# Patient Record
Sex: Female | Born: 1960
Health system: Southern US, Community
[De-identification: ages and names within clinical notes are randomized; demographics above are authoritative.]

## PROBLEM LIST (undated history)

## (undated) DIAGNOSIS — IMO0002 Reserved for concepts with insufficient information to code with codable children: Secondary | ICD-10-CM

## (undated) DIAGNOSIS — D649 Anemia, unspecified: Secondary | ICD-10-CM

## (undated) DIAGNOSIS — Z8249 Family history of ischemic heart disease and other diseases of the circulatory system: Secondary | ICD-10-CM

## (undated) DIAGNOSIS — E785 Hyperlipidemia, unspecified: Secondary | ICD-10-CM

## (undated) DIAGNOSIS — F329 Major depressive disorder, single episode, unspecified: Secondary | ICD-10-CM

## (undated) DIAGNOSIS — M5416 Radiculopathy, lumbar region: Secondary | ICD-10-CM

## (undated) DIAGNOSIS — M1990 Unspecified osteoarthritis, unspecified site: Secondary | ICD-10-CM

## (undated) DIAGNOSIS — M722 Plantar fascial fibromatosis: Secondary | ICD-10-CM

## (undated) DIAGNOSIS — A029 Salmonella infection, unspecified: Secondary | ICD-10-CM

## (undated) DIAGNOSIS — I493 Ventricular premature depolarization: Secondary | ICD-10-CM

## (undated) DIAGNOSIS — F419 Anxiety disorder, unspecified: Secondary | ICD-10-CM

## (undated) DIAGNOSIS — I10 Essential (primary) hypertension: Secondary | ICD-10-CM

## (undated) DIAGNOSIS — K589 Irritable bowel syndrome without diarrhea: Secondary | ICD-10-CM

## (undated) DIAGNOSIS — T7840XA Allergy, unspecified, initial encounter: Secondary | ICD-10-CM

## (undated) DIAGNOSIS — F32A Depression, unspecified: Secondary | ICD-10-CM

## (undated) DIAGNOSIS — M797 Fibromyalgia: Secondary | ICD-10-CM

## (undated) HISTORY — PX: KNEE ARTHROSCOPY: SUR90

## (undated) HISTORY — DX: Hyperlipidemia, unspecified: E78.5

## (undated) HISTORY — PX: EYE SURGERY: SHX253

## (undated) HISTORY — DX: Irritable bowel syndrome, unspecified: K58.9

## (undated) HISTORY — DX: Allergy, unspecified, initial encounter: T78.40XA

## (undated) HISTORY — DX: Anemia, unspecified: D64.9

## (undated) HISTORY — DX: Reserved for concepts with insufficient information to code with codable children: IMO0002

## (undated) HISTORY — DX: Plantar fascial fibromatosis: M72.2

## (undated) HISTORY — PX: POLYPECTOMY: SHX149

## (undated) HISTORY — DX: Family history of ischemic heart disease and other diseases of the circulatory system: Z82.49

## (undated) HISTORY — PX: COLONOSCOPY: SHX174

## (undated) HISTORY — DX: Salmonella infection, unspecified: A02.9

## (undated) HISTORY — PX: TONSILLECTOMY: SUR1361

## (undated) HISTORY — DX: Unspecified osteoarthritis, unspecified site: M19.90

## (undated) HISTORY — PX: OTHER SURGICAL HISTORY: SHX169

## (undated) HISTORY — DX: Radiculopathy, lumbar region: M54.16

## (undated) HISTORY — DX: Fibromyalgia: M79.7

## (undated) HISTORY — DX: Ventricular premature depolarization: I49.3

## (undated) HISTORY — DX: Essential (primary) hypertension: I10

## (undated) HISTORY — DX: Anxiety disorder, unspecified: F41.9

---

## 1998-12-29 ENCOUNTER — Encounter: Admission: RE | Admit: 1998-12-29 | Discharge: 1999-03-29 | Payer: Self-pay | Admitting: Internal Medicine

## 1999-12-05 ENCOUNTER — Other Ambulatory Visit: Admission: RE | Admit: 1999-12-05 | Discharge: 1999-12-05 | Payer: Self-pay | Admitting: Internal Medicine

## 1999-12-28 ENCOUNTER — Encounter: Payer: Self-pay | Admitting: Internal Medicine

## 1999-12-28 ENCOUNTER — Encounter: Admission: RE | Admit: 1999-12-28 | Discharge: 1999-12-28 | Payer: Self-pay | Admitting: Internal Medicine

## 2000-01-08 ENCOUNTER — Encounter: Admission: RE | Admit: 2000-01-08 | Discharge: 2000-01-08 | Payer: Self-pay | Admitting: Internal Medicine

## 2000-01-08 ENCOUNTER — Encounter: Payer: Self-pay | Admitting: Internal Medicine

## 2000-01-31 ENCOUNTER — Encounter: Admission: RE | Admit: 2000-01-31 | Discharge: 2000-01-31 | Payer: Self-pay | Admitting: Family Medicine

## 2000-01-31 ENCOUNTER — Encounter: Payer: Self-pay | Admitting: Gynecology

## 2000-02-26 ENCOUNTER — Ambulatory Visit (HOSPITAL_COMMUNITY): Admission: RE | Admit: 2000-02-26 | Discharge: 2000-02-26 | Payer: Self-pay | Admitting: Gynecology

## 2001-09-30 ENCOUNTER — Ambulatory Visit (HOSPITAL_COMMUNITY): Admission: AD | Admit: 2001-09-30 | Discharge: 2001-09-30 | Payer: Self-pay | Admitting: *Deleted

## 2001-09-30 ENCOUNTER — Encounter (INDEPENDENT_AMBULATORY_CARE_PROVIDER_SITE_OTHER): Payer: Self-pay | Admitting: Specialist

## 2001-10-02 ENCOUNTER — Inpatient Hospital Stay (HOSPITAL_COMMUNITY): Admission: AD | Admit: 2001-10-02 | Discharge: 2001-10-02 | Payer: Self-pay | Admitting: *Deleted

## 2001-10-11 ENCOUNTER — Inpatient Hospital Stay (HOSPITAL_COMMUNITY): Admission: AD | Admit: 2001-10-11 | Discharge: 2001-10-11 | Payer: Self-pay | Admitting: Gynecology

## 2002-11-29 ENCOUNTER — Encounter: Payer: Self-pay | Admitting: Internal Medicine

## 2002-11-29 ENCOUNTER — Encounter: Admission: RE | Admit: 2002-11-29 | Discharge: 2002-11-29 | Payer: Self-pay | Admitting: Internal Medicine

## 2004-01-30 ENCOUNTER — Inpatient Hospital Stay (HOSPITAL_COMMUNITY): Admission: AD | Admit: 2004-01-30 | Discharge: 2004-01-30 | Payer: Self-pay | Admitting: Gynecology

## 2004-02-20 ENCOUNTER — Encounter: Admission: RE | Admit: 2004-02-20 | Discharge: 2004-02-20 | Payer: Self-pay | Admitting: Internal Medicine

## 2004-09-21 ENCOUNTER — Emergency Department (HOSPITAL_COMMUNITY): Admission: EM | Admit: 2004-09-21 | Discharge: 2004-09-21 | Payer: Self-pay | Admitting: Emergency Medicine

## 2004-10-11 ENCOUNTER — Other Ambulatory Visit: Admission: RE | Admit: 2004-10-11 | Discharge: 2004-10-11 | Payer: Self-pay | Admitting: Internal Medicine

## 2005-03-19 ENCOUNTER — Encounter: Admission: RE | Admit: 2005-03-19 | Discharge: 2005-03-19 | Payer: Self-pay | Admitting: Internal Medicine

## 2005-10-03 ENCOUNTER — Other Ambulatory Visit: Admission: RE | Admit: 2005-10-03 | Discharge: 2005-10-03 | Payer: Self-pay | Admitting: Internal Medicine

## 2006-03-21 ENCOUNTER — Encounter: Admission: RE | Admit: 2006-03-21 | Discharge: 2006-03-21 | Payer: Self-pay | Admitting: Internal Medicine

## 2006-10-23 ENCOUNTER — Other Ambulatory Visit: Admission: RE | Admit: 2006-10-23 | Discharge: 2006-10-23 | Payer: Self-pay | Admitting: Internal Medicine

## 2007-06-10 ENCOUNTER — Encounter: Admission: RE | Admit: 2007-06-10 | Discharge: 2007-06-10 | Payer: Self-pay | Admitting: Family Medicine

## 2007-06-23 ENCOUNTER — Encounter: Admission: RE | Admit: 2007-06-23 | Discharge: 2007-06-23 | Payer: Self-pay | Admitting: Family Medicine

## 2008-02-23 ENCOUNTER — Other Ambulatory Visit: Admission: RE | Admit: 2008-02-23 | Discharge: 2008-02-23 | Payer: Self-pay | Admitting: Family Medicine

## 2008-04-01 ENCOUNTER — Encounter: Admission: RE | Admit: 2008-04-01 | Discharge: 2008-04-01 | Payer: Self-pay | Admitting: Family Medicine

## 2008-07-14 ENCOUNTER — Ambulatory Visit: Payer: Self-pay | Admitting: Obstetrics and Gynecology

## 2008-12-19 ENCOUNTER — Ambulatory Visit: Payer: Self-pay | Admitting: Obstetrics and Gynecology

## 2009-04-07 ENCOUNTER — Encounter: Admission: RE | Admit: 2009-04-07 | Discharge: 2009-04-07 | Payer: Self-pay | Admitting: Obstetrics and Gynecology

## 2009-05-25 ENCOUNTER — Other Ambulatory Visit: Admission: RE | Admit: 2009-05-25 | Discharge: 2009-05-25 | Payer: Self-pay | Admitting: Obstetrics and Gynecology

## 2009-05-25 ENCOUNTER — Ambulatory Visit: Payer: Self-pay | Admitting: Obstetrics and Gynecology

## 2009-05-25 ENCOUNTER — Encounter: Payer: Self-pay | Admitting: Obstetrics and Gynecology

## 2009-12-27 ENCOUNTER — Ambulatory Visit: Payer: Self-pay | Admitting: Obstetrics and Gynecology

## 2010-01-20 ENCOUNTER — Emergency Department (HOSPITAL_BASED_OUTPATIENT_CLINIC_OR_DEPARTMENT_OTHER): Admission: EM | Admit: 2010-01-20 | Discharge: 2010-01-20 | Payer: Self-pay | Admitting: Emergency Medicine

## 2010-01-24 ENCOUNTER — Ambulatory Visit: Payer: Self-pay | Admitting: Obstetrics and Gynecology

## 2010-02-01 ENCOUNTER — Ambulatory Visit: Payer: Self-pay | Admitting: Obstetrics and Gynecology

## 2010-05-23 ENCOUNTER — Ambulatory Visit: Payer: Self-pay | Admitting: Obstetrics and Gynecology

## 2010-07-30 ENCOUNTER — Encounter: Admission: RE | Admit: 2010-07-30 | Discharge: 2010-07-30 | Payer: Self-pay | Admitting: Obstetrics and Gynecology

## 2010-08-07 ENCOUNTER — Other Ambulatory Visit: Admission: RE | Admit: 2010-08-07 | Discharge: 2010-08-07 | Payer: Self-pay | Admitting: Obstetrics and Gynecology

## 2010-08-07 ENCOUNTER — Ambulatory Visit: Payer: Self-pay | Admitting: Obstetrics and Gynecology

## 2010-11-18 ENCOUNTER — Encounter: Payer: Self-pay | Admitting: Family Medicine

## 2011-03-15 NOTE — Op Note (Signed)
[facility]  Patient:    Megan Combs, Megan Combs Visit Number: [id number] MRN: [id number]          Service Type: MED Location: [facility] Attending Physician:  Collene Schlichter Dictated by:   Almedia Balls. Randell Patient, M.D. Proc. Date: 09/30/01 Admit Date:  09/30/2001   CC:         Leatha Gilding. Mezer, M.D.   Operative Report  PREOPERATIVE DIAGNOSIS:       Probable missed abortion.  POSTOPERATIVE DIAGNOSIS:      Probable missed abortion pending pathology.  OPERATION:                    Suction dilation and evacuation.  SURGEON:                      Almedia Balls. Randell Patient, M.D.  ANESTHESIA:                   MAC with local 1% lidocaine, 10 ml paracervical block.  INDICATIONS:                  The patient is a 50 year old who has been followed by Dr. Chevis Pretty for infertility.  Over the past 1-1/2 weeks, she has had a quantitative hCG level which initially rose slowly and then dropped.  Dr. Chevis Pretty and the patient discussed this problem, and felt that she should undergo a D&E to ensure that there was trophoblastic tissue within the uterus and to provide for further infertility workup.  She has been fully counseled as to the nature of the procedure and the risks involved to include the risk of anesthesia; injury to the uterus, tubes, ovaries, bowel, bladder, blood vessels or ureters; postoperative hemorrhage; infection; and recuperation. She fully understands all of these considerations and wishes to proceed on September 30, 2001.  OPERATIVE FINDINGS:           On bimanual exam, the uterus was top normal size.  There were no palpable adnexal masses.  On speculum exam, the cervix was slightly dilated, with some blood passing through the os.  The uterus sounded to approximately 8 cm.  DESCRIPTION OF PROCEDURE:     With the patient under sedation, she was prepared and draped in the usual sterile fashion in the dorsal lithotomy position.  A speculum was placed in the vagina.   Lidocaine solution, 1%, was injected at the 3, 9 and 12 oclock positions for a total of 10 ml for paracervical block.  A single-tooth tenaculum was placed on the anterior lip of the cervix and the cervix was dilated to a #27 Pratt dilator.  A #9 curved suction curet was employed to remove uterine contents.  After no further tissue was obtained, a small sharp curet was used for gentle exploration of the endometrial cavity.  The suction curet was again employed to ensure that all tissue had been removed.  After noting that all tissue was removed and that hemostasis was maintained, the procedure was terminated.  ESTIMATED BLOOD LOSS:         Approximately 100 ml from the procedure.  DISPOSITION:                  The patient was taken to the recovery room in good condition following catheterization and free flow of clear urine.  The patient will be discharged with stable vital signs from the PACU.  She was instructed to gradually progress her activities at home and  to call for heavy bleeding, pain or unexplained fever should ensue.  She was given a prescription for ampicillin 400 mg, #16, to be taken four stat and one q.i.d.; Methergine 0.5 mg, #8, to be taken one q.i.d. and Darvocet-N 100 or generic, #10, to be take 1/2 to 1 q.4h. p.r.n. pain. Dictated by:   Almedia Balls Randell Patient, M.D. Attending Physician:  Collene Schlichter DD:  09/30/01 TD:  09/30/01 Job: [id number]

## 2011-03-15 NOTE — Op Note (Signed)
[facility]  Patient:    Megan Combs, Megan Combs                      MRN: [id number] Proc. Date: 02/26/00 Adm. Date:  [id number] Disc. Date: [id number] Attending:  Teodora Medici Cabitt                           Operative Report  PREOPERATIVE DIAGNOSIS:  Infertility.  POSTOPERATIVE DIAGNOSIS:  Infertility.  OPERATION:  Diagnostic laparoscopy.  SURGEON:  Leatha Gilding. Mezer, M.D.  ASSISTANT:  ANESTHESIA:  General endotracheal.  PREPARATION:  Betadine.  DESCRIPTION OF PROCEDURE:  With the patient in the lithotomy position, she was prepped and draped in the routine fashion.  A tenaculum was placed on the cervix and as the Jarcho cannula was being attached to the tenaculum, the cervix tore. At that time, it was noted that the wrong tenaculum had been placed on the operative set and this tenaculum was too short for the Jarcho cannula.  A proper tenaculum was then obtained.  Minimum bleeding was coming from the cervical laceration and the decision was made to wait to the end of the procedure to inspect to see if  suture was required.  The tenaculum and Jarcho cannula were then placed. Attention was then directed to the laparoscopy.  An incision was made in the umbilicus and the Veress needle introduced without difficulty.  As the case was about to be started, it was noted that the flow rate was set to maximum and the patient pressure was set to 30 mm.  It took the staff several minutes to respond to the  observation and to properly set the machine.  Negative pressure had been identified prior.  The pressure initially started as low, but somewhat more quickly than expected rose to the 6-9 mm range.  Hepatic dullness had been lost and there was a question about the Veress needle becoming subfascial.  There was a hissing sound of leaking gas and the Veress needle was withdrawn and reintroduced.  At that point, the pressure was somewhat lower and a total of  3.5 L of carbon dioxide were instilled.  The incision was extended inferiorly in the midline and it was noted that there was a persistent hissing of gas leaking.  This further raised the concerns that there had been significant subfascial insufflation and not intraperitoneal and the primary trocar was introduced very cautiously to just below the fascia.  The scope was introduced in the subfascial position and the peritoneum was noted.  Upon lifting the anterior abdominal wall, it was noted that there was gas in the peritoneal cavity.  A blunt instrument was used to make a defect in he avascular portion of the peritoneum.  The peritoneal cavity was safely entered.  The scope and trocar were then introduced.  The gas escaping sound was still present and it was determined at that time that this noise was coming from the warming blanket that had been placed by anesthesia.  Very careful attention was  paid to the insertion site and it was noted that there was gas within the omentum from the midline towards the right side and this coursed over the bowel.  The bowel serosa did not appear to be involved in the insufflation.  A secondary trocar was placed in the midline under direct vision.  Gentle manipulation was done and no  sign of a bowel defect or blood or stool  contents or small bowel contents were noted in the peritoneal cavity.  As the patient was slowly placed on Trendelenburg in stepwise fashion, the bowel was further inspected and no trauma was noted. t the completion of the procedure, the bowel was completely inspected again and again no sign of bowel trauma was noted.  Exploration of the pelvis revealed the uterus to be normal in size and contour.  The fallopian tubes were normal throughout their length.  The ovaries were normal bilaterally.  The fimbriae were adequate bilaterally.  Careful inspection of the peritoneal surfaces revealed no typical or atypical  endometriosis.  At the completion of the procedure after the bowel was  reinspected, an effort was made to remove the carbon dioxide gas.  The instruments were removed and the skin incisions were closed with interrupted 4-0 chromic suture.  The estimated blood loss was less than 10 cc.  The sponge, needle, and  instrument counts were correct x 2.  The patient tolerated the procedure well and was taken to the recovery room in satisfactory condition.  The cervix was reinspected prior to undraping and the bleeding had completely stopped from the  cervical laceration. DD:  02/26/00 TD:  02/28/00 Job: [id number]

## 2011-04-20 ENCOUNTER — Emergency Department (HOSPITAL_BASED_OUTPATIENT_CLINIC_OR_DEPARTMENT_OTHER)
Admission: EM | Admit: 2011-04-20 | Discharge: 2011-04-20 | Disposition: A | Discharge status: Home / Self Care | Payer: BC Managed Care – PPO | Attending: Emergency Medicine | Admitting: Emergency Medicine

## 2011-04-20 ENCOUNTER — Emergency Department (INDEPENDENT_AMBULATORY_CARE_PROVIDER_SITE_OTHER): Payer: BC Managed Care – PPO

## 2011-04-20 DIAGNOSIS — Z79899 Other long term (current) drug therapy: Secondary | ICD-10-CM | POA: Insufficient documentation

## 2011-04-20 DIAGNOSIS — W108XXA Fall (on) (from) other stairs and steps, initial encounter: Secondary | ICD-10-CM | POA: Insufficient documentation

## 2011-04-20 DIAGNOSIS — S60229A Contusion of unspecified hand, initial encounter: Secondary | ICD-10-CM | POA: Insufficient documentation

## 2011-04-20 DIAGNOSIS — M25549 Pain in joints of unspecified hand: Secondary | ICD-10-CM

## 2011-06-13 ENCOUNTER — Encounter: Payer: Self-pay | Admitting: Gastroenterology

## 2011-07-09 ENCOUNTER — Other Ambulatory Visit: Payer: BC Managed Care – PPO | Admitting: Gastroenterology

## 2011-10-18 ENCOUNTER — Encounter: Payer: Self-pay | Admitting: Gastroenterology

## 2011-11-06 ENCOUNTER — Encounter (HOSPITAL_BASED_OUTPATIENT_CLINIC_OR_DEPARTMENT_OTHER): Payer: Self-pay

## 2011-11-06 ENCOUNTER — Emergency Department (INDEPENDENT_AMBULATORY_CARE_PROVIDER_SITE_OTHER): Payer: No Typology Code available for payment source

## 2011-11-06 ENCOUNTER — Emergency Department (HOSPITAL_BASED_OUTPATIENT_CLINIC_OR_DEPARTMENT_OTHER)
Admission: EM | Admit: 2011-11-06 | Discharge: 2011-11-06 | Disposition: A | Discharge status: Home / Self Care | Payer: No Typology Code available for payment source | Attending: Emergency Medicine | Admitting: Emergency Medicine

## 2011-11-06 DIAGNOSIS — Y92410 Unspecified street and highway as the place of occurrence of the external cause: Secondary | ICD-10-CM | POA: Insufficient documentation

## 2011-11-06 DIAGNOSIS — S139XXA Sprain of joints and ligaments of unspecified parts of neck, initial encounter: Secondary | ICD-10-CM | POA: Insufficient documentation

## 2011-11-06 DIAGNOSIS — F3289 Other specified depressive episodes: Secondary | ICD-10-CM | POA: Insufficient documentation

## 2011-11-06 DIAGNOSIS — S161XXA Strain of muscle, fascia and tendon at neck level, initial encounter: Secondary | ICD-10-CM

## 2011-11-06 DIAGNOSIS — M79609 Pain in unspecified limb: Secondary | ICD-10-CM | POA: Insufficient documentation

## 2011-11-06 DIAGNOSIS — M549 Dorsalgia, unspecified: Secondary | ICD-10-CM

## 2011-11-06 DIAGNOSIS — M545 Low back pain, unspecified: Secondary | ICD-10-CM

## 2011-11-06 DIAGNOSIS — M542 Cervicalgia: Secondary | ICD-10-CM | POA: Insufficient documentation

## 2011-11-06 DIAGNOSIS — F329 Major depressive disorder, single episode, unspecified: Secondary | ICD-10-CM | POA: Insufficient documentation

## 2011-11-06 DIAGNOSIS — Z79899 Other long term (current) drug therapy: Secondary | ICD-10-CM | POA: Insufficient documentation

## 2011-11-06 HISTORY — DX: Major depressive disorder, single episode, unspecified: F32.9

## 2011-11-06 HISTORY — DX: Depression, unspecified: F32.A

## 2011-11-06 MED ORDER — CYCLOBENZAPRINE HCL 5 MG PO TABS: 5.0000 mg | ORAL_TABLET | Freq: Two times a day (BID) | ORAL | Status: DC | PRN

## 2011-11-06 MED ORDER — HYDROCODONE-ACETAMINOPHEN 5-500 MG PO TABS: 1.0000 | ORAL_TABLET | Freq: Four times a day (QID) | ORAL | Status: DC | PRN
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
## 2011-11-06 NOTE — ED Provider Notes (Signed)
Medical screening examination/treatment/procedure(s) were performed by non-physician practitioner and as supervising physician I was immediately available for consultation/collaboration.   Forbes Cellar, MD 11/06/11 1728

## 2011-11-12 ENCOUNTER — Other Ambulatory Visit: Payer: Self-pay | Admitting: Physician Assistant

## 2011-11-12 ENCOUNTER — Other Ambulatory Visit (HOSPITAL_COMMUNITY)
Admission: RE | Admit: 2011-11-12 | Discharge: 2011-11-12 | Disposition: A | Discharge status: Home / Self Care | Payer: BC Managed Care – PPO | Source: Ambulatory Visit | Attending: Family Medicine | Admitting: Family Medicine

## 2011-11-12 ENCOUNTER — Ambulatory Visit (AMBULATORY_SURGERY_CENTER): Payer: BC Managed Care – PPO | Admitting: *Deleted

## 2011-11-12 VITALS — Ht 66.5 in | Wt 162.0 lb

## 2011-11-12 DIAGNOSIS — Z01419 Encounter for gynecological examination (general) (routine) without abnormal findings: Secondary | ICD-10-CM | POA: Insufficient documentation

## 2011-11-12 DIAGNOSIS — Z1211 Encounter for screening for malignant neoplasm of colon: Secondary | ICD-10-CM

## 2011-11-12 MED ORDER — PEG-KCL-NACL-NASULF-NA ASC-C 100 G PO SOLR: ORAL | Status: DC

## 2011-11-13 ENCOUNTER — Encounter: Payer: Self-pay | Admitting: Gastroenterology

## 2011-11-14 ENCOUNTER — Encounter (HOSPITAL_BASED_OUTPATIENT_CLINIC_OR_DEPARTMENT_OTHER): Admission: RE | Payer: Self-pay | Source: Ambulatory Visit

## 2011-11-14 ENCOUNTER — Ambulatory Visit (HOSPITAL_BASED_OUTPATIENT_CLINIC_OR_DEPARTMENT_OTHER)
Admission: RE | Admit: 2011-11-14 | Payer: BC Managed Care – PPO | Source: Ambulatory Visit | Admitting: Orthopedic Surgery

## 2011-11-14 SURGERY — ARTHROSCOPY, KNEE
Anesthesia: General | Laterality: Right

## 2011-11-18 ENCOUNTER — Telehealth: Payer: Self-pay | Admitting: Gastroenterology

## 2011-11-18 NOTE — Telephone Encounter (Signed)
Megan Combs requested her path report from her last colonoscopy be faxed to Dr Christella Hartigan and she will bring in her procedure report Friday 11/22/11

## 2011-11-19 ENCOUNTER — Other Ambulatory Visit: Payer: Self-pay | Admitting: Family Medicine

## 2011-11-19 DIAGNOSIS — Z1231 Encounter for screening mammogram for malignant neoplasm of breast: Secondary | ICD-10-CM

## 2011-11-22 ENCOUNTER — Encounter: Payer: Self-pay | Admitting: Gastroenterology

## 2011-11-22 ENCOUNTER — Ambulatory Visit (AMBULATORY_SURGERY_CENTER): Payer: No Typology Code available for payment source | Admitting: Gastroenterology

## 2011-11-22 VITALS — BP 134/84 | HR 74 | Temp 98.2°F | Resp 17 | Ht 66.0 in | Wt 162.0 lb

## 2011-11-22 DIAGNOSIS — Z800 Family history of malignant neoplasm of digestive organs: Secondary | ICD-10-CM

## 2011-11-22 DIAGNOSIS — D126 Benign neoplasm of colon, unspecified: Secondary | ICD-10-CM

## 2011-11-22 DIAGNOSIS — Z1211 Encounter for screening for malignant neoplasm of colon: Secondary | ICD-10-CM

## 2011-11-22 MED ORDER — SODIUM CHLORIDE 0.9 % IV SOLN: 500.0000 mL | INTRAVENOUS | Status: DC

## 2011-11-22 NOTE — Progress Notes (Signed)
Patient did not experience any of the following events: a burn prior to discharge; a fall within the facility; wrong site/side/patient/procedure/implant event; or a hospital transfer or hospital admission upon discharge from the facility. (G8907) Patient did not have preoperative order for IV antibiotic SSI prophylaxis. (G8918)  

## 2011-11-22 NOTE — Patient Instructions (Signed)
FOLLOW THE INSTRUCTIONS ON THE BLUE AND GREEN INSTRUCTION SHEETS.  CONTINUE YOUR MEDICATIONS.  AWAIT PATHOLOGY RESULTS.

## 2011-11-22 NOTE — Op Note (Signed)
[facility] [address] [place], [place]  [address]  COLONOSCOPY PROCEDURE REPORT  PATIENT:  Megan Combs, Megan Combs  MR#:  [id number] BIRTHDATE:  1960-12-09, 50 yrs. old  GENDER:  female ENDOSCOPIST:  Rachael Fee, MD REFERRING:  Claude Manges, MD PROCEDURE DATE:  11/22/2011 PROCEDURE:  Colonoscopy with biopsy ASA CLASS:  Class II INDICATIONS:  father had colon cancer in 50's, she had polyp May 2007 removed by Dr. Sherin Quarry (unclear pathology) MEDICATIONS:   Fentanyl 75 mcg IV, These medications were titrated to patient response per physician's verbal order, Versed 7 mg IV  DESCRIPTION OF PROCEDURE:   After the risks benefits and alternatives of the procedure were thoroughly explained, informed consent was obtained.  Digital rectal exam was performed and revealed no rectal masses.   The LB [id number]-[id number] endoscope was introduced through the anus and advanced to the cecum, which was identified by both the appendix and ileocecal valve, without limitations.  The quality of the prep was good..  The instrument was then slowly withdrawn as the colon was fully examined. <<PROCEDUREIMAGES>> FINDINGS:  A diminutive polyp was found in the transverse colon. This was removed with biopsy forceps and sent to pathology (jar 1) (see image4).  This was otherwise a normal examination of the colon (see image2, image3, and image7).   Retroflexed views in the rectum revealed no abnormalities. COMPLICATIONS:  None  ENDOSCOPIC IMPRESSION: 1) Diminutive polyp in the transverse colon, this was removed and sent to pathology 2) Otherwise normal examination  RECOMMENDATIONS: 1) Given your significant family history of colon cancer, you should have a repeat colonoscopy in 5 years even if the polyp today is NOT precancerous. 2) You will receive a letter within 1-2 weeks with the results of your biopsy as well as final recommendations. Please call my office if you have not received a letter  after 3 weeks.  ______________________________ Rachael Fee, MD  n. eSIGNED:   Rachael Fee at 11/22/2011 01:35 PM  Ertel, Waynetta Sandy, [phone]

## 2011-11-25 ENCOUNTER — Telehealth: Payer: Self-pay | Admitting: *Deleted

## 2011-11-25 NOTE — Telephone Encounter (Signed)
NO ANSWER, MESSAGE LEFT FOR THE PATIENT. 

## 2011-11-28 ENCOUNTER — Ambulatory Visit
Admission: RE | Admit: 2011-11-28 | Discharge: 2011-11-28 | Disposition: A | Discharge status: Home / Self Care | Payer: BC Managed Care – PPO | Source: Ambulatory Visit | Attending: Family Medicine | Admitting: Family Medicine

## 2011-11-28 ENCOUNTER — Other Ambulatory Visit: Payer: Self-pay | Admitting: Family Medicine

## 2011-11-28 DIAGNOSIS — Z1231 Encounter for screening mammogram for malignant neoplasm of breast: Secondary | ICD-10-CM

## 2011-11-28 DIAGNOSIS — N63 Unspecified lump in unspecified breast: Secondary | ICD-10-CM

## 2011-12-03 ENCOUNTER — Encounter: Payer: Self-pay | Admitting: Gastroenterology

## 2011-12-20 ENCOUNTER — Ambulatory Visit
Admission: RE | Admit: 2011-12-20 | Discharge: 2011-12-20 | Disposition: A | Discharge status: Home / Self Care | Payer: BC Managed Care – PPO | Source: Ambulatory Visit | Attending: Family Medicine | Admitting: Family Medicine

## 2011-12-20 ENCOUNTER — Other Ambulatory Visit: Payer: Self-pay | Admitting: Family Medicine

## 2011-12-20 DIAGNOSIS — N63 Unspecified lump in unspecified breast: Secondary | ICD-10-CM

## 2011-12-25 ENCOUNTER — Ambulatory Visit (INDEPENDENT_AMBULATORY_CARE_PROVIDER_SITE_OTHER): Payer: BC Managed Care – PPO | Admitting: Gastroenterology

## 2011-12-25 ENCOUNTER — Encounter: Payer: Self-pay | Admitting: Gastroenterology

## 2011-12-25 DIAGNOSIS — Z860100 Personal history of colon polyps, unspecified: Secondary | ICD-10-CM

## 2011-12-25 DIAGNOSIS — Z809 Family history of malignant neoplasm, unspecified: Secondary | ICD-10-CM | POA: Insufficient documentation

## 2011-12-25 NOTE — Patient Instructions (Signed)
Repeat colonoscopy in 5 years for personal history of adenomatous polyps and also family history of colon cancer.  If new GI symptoms arise before then, please call or set up office visit to discuss.

## 2011-12-25 NOTE — Progress Notes (Signed)
Review of pertinent gastrointestinal problems: 1. Elevated risk for colon cancer:  Personal history of adenomatous colon polyps and FH of colon cancer: father had colon cancer in 50's, she had polyp May 2007 removed by Dr. Sherin Quarry (unclear pathology), colonoscopy 10/2011 Dr. 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   Review of systems: Pertinent positive and negative review of systems were noted in the above HPI section. Complete review of systems was performed and was otherwise normal.    Past Medical History  Diagnosis Date  . Depression   . Arthritis     right knee    Past Surgical History  Procedure Date  . Knee arthroscopy   . Tonsillectomy   . Laproscopy     Current Outpatient Prescriptions  Medication Sig Dispense Refill  . b complex vitamins tablet Take 1 tablet by mouth daily.      Marland Kitchen buPROPion (WELLBUTRIN XL) 150 MG 24 hr tablet Take 150 mg by mouth daily.      Marland Kitchen buPROPion (WELLBUTRIN XL) 300 MG 24 hr tablet Take 300 mg by mouth daily.      . Calcium Carbonate-Vitamin D (CALCIUM 500 + D) 500-125 MG-UNIT TABS Take 1 tablet by mouth daily.      . DULoxetine (CYMBALTA) 30 MG capsule Take 30 mg by mouth at bedtime.      . fish oil-omega-3 fatty acids 1000 MG capsule Take 1 g by mouth daily.      Marland Kitchen glucosamine-chondroitin 500-400 MG tablet Take 3 tablets by mouth daily.      . Magnesium 250 MG TABS Take 500 mg by mouth daily.      . Multiple Vitamin (MULITIVITAMIN WITH MINERALS) TABS Take 1 tablet by mouth daily.        Allergies as of 12/25/2011 - Review Complete 12/25/2011  Allergen Reaction Noted  . Dulcolax (bisacodyl)  11/06/2011  . Zithromax (azithromycin) Rash 11/06/2011    Family History  Problem Relation Age of Onset  . Colon cancer Father   .  Esophageal cancer Father     History   Social History  . Marital Status: Married    Spouse Name: N/A    Number of Children: N/A  . Years of Education: N/A   Occupational History  . RN    Social History Main Topics  . Smoking status: Never Smoker   . Smokeless tobacco: Never Used  . Alcohol Use: No  . Drug Use: No  . Sexually Active:    Other Topics Concern  . Not on file   Social History Narrative  . No narrative on file       Physical Exam: BP 110/68  Pulse 100  Ht 5\' 6"  (1.676 m)  Wt 164 lb 6.4 oz (74.571 kg)  BMI 26.53 kg/m2 Constitutional: generally well-appearing Psychiatric: alert and oriented x3 Eyes: extraocular movements intact Skin: no lesions on visible extremities    Assessment and plan: 51 y.o. female with  elevated risk for colon cancer given personal history of adenomatous polyps, family history of colon cancer  We had a great, perhaps 20 minutes to half hour discussion about colon cancer screening, polyp surveillance. I provided her with the current 2012 multisociety task force on colon cancer screening and prevention document. I explained to her that I do think it  is safe for her to wait 5 years for her next colonoscopy, that assumes that she has no GI symptoms of course and if she does she will get in touch. She is a very intelligent pleasant woman and we had a fruitful discussion touching on many aspects of colon cancer screening, polyp surveillance.

## 2012-05-28 ENCOUNTER — Encounter: Payer: Self-pay | Admitting: Sports Medicine

## 2012-05-28 ENCOUNTER — Ambulatory Visit (INDEPENDENT_AMBULATORY_CARE_PROVIDER_SITE_OTHER): Payer: BC Managed Care – PPO | Admitting: Sports Medicine

## 2012-05-28 VITALS — BP 125/80 | HR 84 | Ht 66.5 in | Wt 154.0 lb

## 2012-05-28 DIAGNOSIS — M722 Plantar fascial fibromatosis: Secondary | ICD-10-CM

## 2012-05-28 NOTE — Progress Notes (Signed)
  Subjective:    Patient ID: Megan Combs, female    DOB: Jan 26, 1961, 51 y.o.   MRN: [id number]  HPI chief complaint: Bilateral foot pain  Patient comes in today complaining of 2 months of bilateral foot. Symptoms began after she started a Zumba class. She saw Dr. Althea Charon who gave her heel cups, plantar fascial stretches, and a two-week course of Voltaren. This improved her symptoms by 50-60% but she has since plateaued. Pain is mainly in her bilateral heels. Some radiating pain into the left arch. Pain is worse with first standing. She had plantar fasciitis several years ago and feels like her current symptoms are identical to what she experienced at that time. No trauma. No soft tissue swelling. She's been icing her feet which has been helpful.  Medical history is reviewed. Medications are reviewed. She is allergic to azithromycin and Dulcolax. Socially she does not smoke, denies alcohol use.    Review of Systems     Objective:   Physical Exam  Well-developed, well-nourished. No acute distress. She has a slight cavus foot. Mild tenderness to palpation at the calcaneal insertion of the plantar fascia. Negative calcaneal squeeze. No pain along the Achilles. She is neurovascular intact distally. Walking with slight limp.  MS[facility] ultrasound: Both left and right plantar fascia were visualized in the longitudinal plane. Left plantar fascia measures 0.44 cm and right measures 0.48 cm. there is an area of hypoechogenicity in the right plantar fascia at the insertion of the calcaneus. No neovascularity appreciate. No obvious calcaneal spur.      Assessment & Plan:  1. bilateral heel pain secondary to plantar fasciitis  Were going to try a simple well cushioned green sports insole. She can continue with her gel cups if she wants. She will continue with plantar fascial stretches and have educated her in eccentric heel drops. Continue with daily ice. If symptoms become more debilitating, we could  consider merits of a cortisone shot. Followup when necessary

## 2012-06-09 ENCOUNTER — Other Ambulatory Visit: Payer: Self-pay | Admitting: *Deleted

## 2012-06-09 MED ORDER — DICLOFENAC SODIUM 1 % TD GEL: TRANSDERMAL | Status: DC

## 2012-07-01 ENCOUNTER — Encounter: Payer: Self-pay | Admitting: Internal Medicine

## 2012-07-01 ENCOUNTER — Ambulatory Visit (INDEPENDENT_AMBULATORY_CARE_PROVIDER_SITE_OTHER): Payer: BC Managed Care – PPO | Admitting: Internal Medicine

## 2012-07-01 VITALS — BP 120/78 | HR 95 | Temp 98.4°F | Resp 18 | Ht 65.5 in | Wt 155.8 lb

## 2012-07-01 DIAGNOSIS — N951 Menopausal and female climacteric states: Secondary | ICD-10-CM

## 2012-07-01 DIAGNOSIS — Z862 Personal history of diseases of the blood and blood-forming organs and certain disorders involving the immune mechanism: Secondary | ICD-10-CM

## 2012-07-01 DIAGNOSIS — F419 Anxiety disorder, unspecified: Secondary | ICD-10-CM

## 2012-07-01 DIAGNOSIS — E785 Hyperlipidemia, unspecified: Secondary | ICD-10-CM

## 2012-07-01 DIAGNOSIS — F329 Major depressive disorder, single episode, unspecified: Secondary | ICD-10-CM

## 2012-07-01 DIAGNOSIS — M1990 Unspecified osteoarthritis, unspecified site: Secondary | ICD-10-CM

## 2012-07-01 DIAGNOSIS — M797 Fibromyalgia: Secondary | ICD-10-CM | POA: Insufficient documentation

## 2012-07-01 DIAGNOSIS — F32A Depression, unspecified: Secondary | ICD-10-CM | POA: Insufficient documentation

## 2012-07-01 DIAGNOSIS — Z87440 Personal history of urinary (tract) infections: Secondary | ICD-10-CM

## 2012-07-01 DIAGNOSIS — Z780 Asymptomatic menopausal state: Secondary | ICD-10-CM

## 2012-07-01 DIAGNOSIS — N3941 Urge incontinence: Secondary | ICD-10-CM

## 2012-07-01 DIAGNOSIS — F3289 Other specified depressive episodes: Secondary | ICD-10-CM

## 2012-07-01 DIAGNOSIS — F411 Generalized anxiety disorder: Secondary | ICD-10-CM

## 2012-07-01 MED ORDER — PROGESTERONE MICRONIZED 100 MG PO CAPS: 100.0000 mg | ORAL_CAPSULE | Freq: Every day | ORAL | Status: DC

## 2012-07-01 MED ORDER — ESTRADIOL 0.05 MG/24HR TD PTTW: MEDICATED_PATCH | TRANSDERMAL | Status: DC

## 2012-07-01 NOTE — Progress Notes (Signed)
Subjective:    Patient ID: Megan Combs, female    DOB: [birth year], 51 y.o.   MRN: [id number]  HPI New pt. Here for first visit.  Primary care Claude Manges, NP at [facility].  PMH of depression, DJD spine, mild hyperlipidemia,  Menopausal hot flushes,  Colonic polyps with FH of colon CA in father and urge incontinence.   Megan Combs reports she has several symptoms that seem to be worsening and she is wondering if HT will help her.  She has been on HT in the past with Dr. Oletha Blend but had to come off due to emotional lability.  She does report hot flushes day and night 10-20 in the day and wakes up at night.  She also has vaginal dryness.  She also says that she had to come off OC's when she was younger due to emotinal changes and she used a diaphragm for contraception  There is no personal of FH of breast cancer, maternal aunt had ovarian cancer,  Mother had MI, Father had CVA,   No personal or FH of DVT or pulmonary embolus.  She has an aunt with melanoma.    She also has depression and anxiety that seems to have worsened.  She is High dose WEllbutrin and formerly was on Cymbalta but came off due to intolerability.  Short term memory seems worse.  She did have psychologic evaluation several years ago and had CBT which seemed to help.  She is not sure if HT will help with emotions  She is also concerned over urge incontinence and more frequent UTI"S.  She reports 3 Uti's this year so far.  Seems to occur after intercourse.  Has urge incotinence symtpoms     Allergies  Allergen Reactions  . Dulcolax (Bisacodyl)     Dehydration   . Penicillins Hives  . Tape Dermatitis  . Zithromax (Azithromycin) Rash   Past Medical History  Diagnosis Date  . Depression   . Arthritis     right knee  . Fibromyalgia   . Degenerative disc disease   . Fibromyalgia    Past Surgical History  Procedure Date  . Knee arthroscopy   . Tonsillectomy   . Laproscopy   . Knee arthroscopy    History   Social History    . Marital Status: Married    Spouse Name: N/A    Number of Children: N/A  . Years of Education: N/A   Occupational History  . RN    Social History Main Topics  . Smoking status: Never Smoker   . Smokeless tobacco: Never Used  . Alcohol Use: No  . Drug Use: No  . Sexually Active: Yes   Other Topics Concern  . Not on file   Social History Narrative  . No narrative on file   Family History  Problem Relation Age of Onset  . Colon cancer Father   . Esophageal cancer Father   . Cancer Father   . Stroke Father   . Heart disease Father   . Hypertension Father   . Hyperlipidemia Father   . Heart disease Mother   . Diabetes Mother   . Arthritis Mother   . Hypertension Sister   . Diabetes Maternal Grandfather    Patient Active Problem List  Diagnosis  . Family history of malignant neoplasm  . Personal history of colonic polyps  . Fibromyalgia   Current Outpatient Prescriptions on File Prior to Visit  Medication Sig Dispense Refill  . b complex vitamins tablet  Take 1 tablet by mouth daily.      Marland Kitchen buPROPion (WELLBUTRIN XL) 150 MG 24 hr tablet Take 150 mg by mouth daily.      Marland Kitchen buPROPion (WELLBUTRIN XL) 300 MG 24 hr tablet Take 300 mg by mouth daily.      . Calcium Carbonate-Vitamin D (CALCIUM 500 + D) 500-125 MG-UNIT TABS Take 1 tablet by mouth daily.      . diclofenac sodium (VOLTAREN) 1 % GEL aaa tid-qid  100 g  2  . fish oil-omega-3 fatty acids 1000 MG capsule Take 1 g by mouth daily.      Marland Kitchen glucosamine-chondroitin 500-400 MG tablet Take 3 tablets by mouth daily.      . Magnesium 250 MG TABS Take 500 mg by mouth daily.      . Multiple Vitamin (MULITIVITAMIN WITH MINERALS) TABS Take 1 tablet by mouth daily.      . ciprofloxacin (CIPRO) 500 MG tablet Take 500 mg by mouth Twice daily.      . phenazopyridine (PYRIDIUM) 100 MG tablet Take 100 mg by mouth daily.            Review of Systems See HPI    Objective:   Physical Exam Physical Exam  Nursing note and  vitals reviewed.  Constitutional: She is oriented to person, place, and time. She appears well-developed and well-nourished.  HENT:  Head: Normocephalic and atraumatic.  Cardiovascular: Normal rate and regular rhythm. Exam reveals no gallop and no friction rub.  No murmur heard.  Pulmonary/Chest: Breath sounds normal. She has no wheezes. She has no rales.  Neurological: She is alert and oriented to person, place, and time.  Skin: Skin is warm and dry.  Psychiatric: She has a normal mood and affect. Her behavior is normal.              Assessment & Plan:  Menopausal flushes and vaginal dryness  Pt read risk/ benefit sheet regarding HT and signed.  I counseled that HT will certainly help with these symptoms. Pt seems quite sensitive to HT with emotional lability.  She wishes to try for a short course and I am not opposed to this.  Gave samples of Luvena for moisturizer and lubrication.  Will give Vivelle dot 0.5 mg with daily Prometrium.  Counseled of any calf pain or leg swelling to contact my office and will neeed to check for DVT.  She voices understadning  Urge incontinence  Will check urine today and may refer to PT if pt desires.  May need urology consult at some point  Depression  Counseled HT unlikely to improve depression  Short term Memory impairment  History of anemia  Will check today  History of fibromyalgia

## 2012-07-02 ENCOUNTER — Telehealth: Payer: Self-pay | Admitting: *Deleted

## 2012-07-02 ENCOUNTER — Other Ambulatory Visit: Payer: Self-pay | Admitting: Internal Medicine

## 2012-07-02 ENCOUNTER — Encounter: Payer: Self-pay | Admitting: Internal Medicine

## 2012-07-02 DIAGNOSIS — Z87440 Personal history of urinary (tract) infections: Secondary | ICD-10-CM

## 2012-07-02 DIAGNOSIS — F419 Anxiety disorder, unspecified: Secondary | ICD-10-CM | POA: Insufficient documentation

## 2012-07-02 LAB — LIPID PANEL
HDL: 71 mg/dL (ref >39)
Triglycerides: 57 mg/dL (ref <150)

## 2012-07-02 LAB — POCT URINALYSIS DIPSTICK
Nitrite, UA: NEGATIVE
Protein, UA: NEGATIVE
Urobilinogen, UA: NEGATIVE

## 2012-07-02 LAB — COMPREHENSIVE METABOLIC PANEL
CO2: 30 mEq/L (ref 19–32)
Creat: 0.85 mg/dL (ref 0.50–1.10)
Glucose, Bld: 77 mg/dL (ref 70–99)
Total Bilirubin: 0.3 mg/dL (ref 0.3–1.2)

## 2012-07-02 NOTE — Patient Instructions (Signed)
See me in 3 months or sooner prn  Labs will be mailed to you

## 2012-07-02 NOTE — Addendum Note (Signed)
Addended by: Mathews Robinsons on: 07/02/2012 04:01 PM   Modules accepted: Orders

## 2012-07-02 NOTE — Addendum Note (Signed)
Addended by: Mathews Robinsons on: 07/02/2012 03:39 PM   Modules accepted: Orders

## 2012-07-02 NOTE — Addendum Note (Signed)
Addended by: Mathews Robinsons on: 07/02/2012 03:27 PM   Modules accepted: Orders

## 2012-07-05 LAB — URINE CULTURE

## 2012-07-08 ENCOUNTER — Telehealth: Payer: Self-pay | Admitting: *Deleted

## 2012-07-08 NOTE — Telephone Encounter (Signed)
Pt is interested in pelvic floor PT

## 2012-07-09 ENCOUNTER — Telehealth: Payer: Self-pay | Admitting: Internal Medicine

## 2012-07-09 DIAGNOSIS — N3941 Urge incontinence: Secondary | ICD-10-CM

## 2012-07-09 NOTE — Telephone Encounter (Signed)
Megan Combs called requesting PT for urge incontinence  Will refer to Eulis Foster PT

## 2012-07-13 ENCOUNTER — Telehealth: Payer: Self-pay | Admitting: Internal Medicine

## 2012-07-13 NOTE — Telephone Encounter (Signed)
Megan Combs   Call pt and have her drop off a repeat urine to check for resolution of UTI.  Get U/A and urine c and S when she drops it off  thanks

## 2012-07-14 NOTE — Telephone Encounter (Signed)
Called in medication.

## 2012-07-21 ENCOUNTER — Ambulatory Visit (INDEPENDENT_AMBULATORY_CARE_PROVIDER_SITE_OTHER): Payer: BC Managed Care – PPO | Admitting: Sports Medicine

## 2012-07-21 VITALS — BP 122/70 | Ht 66.0 in | Wt 165.0 lb

## 2012-07-21 DIAGNOSIS — M79609 Pain in unspecified limb: Secondary | ICD-10-CM

## 2012-07-21 DIAGNOSIS — M79673 Pain in unspecified foot: Secondary | ICD-10-CM

## 2012-07-21 DIAGNOSIS — M722 Plantar fascial fibromatosis: Secondary | ICD-10-CM | POA: Insufficient documentation

## 2012-07-21 MED ORDER — NITROGLYCERIN 0.2 MG/HR TD PT24: MEDICATED_PATCH | TRANSDERMAL | Status: DC

## 2012-07-21 NOTE — Progress Notes (Signed)
  Subjective:    Patient ID: Megan Combs, female    DOB: Aug 05, 1961, 51 y.o.   MRN: [id number]  HPI Megan Combs comes in today with persistent left foot pain. Right foot pain has completely resolved. She has persistent pain and swelling in the left heel just distal to the calcaneus in the proximal aspect of her arch. Minimal pain in the heel itself. She has been diligent about doing her home exercises. She has gone back to using her three-quarter length orthotics as these provide a little more arch in the simple green inserts. She's been unable to return to high impact activity like she would like.    Review of Systems     Objective:   Physical Exam Well-developed, well-nourished. No acute distress. Awake alert and oriented x3  Left foot: There is mild soft tissue swelling just distal to the calcaneus and the proximal aspect of her arch. This appears to be along the area of the proximal plantar fascia. Exquisitely tender to palpation. No erythema. No real pain at the calcaneal insertion of the plantar fascia. Negative calcaneal squeeze. Neurovascular intact distally. Walks with a noticeable limp.  MSK ultrasound of the left heel shows a rather large area of hypoechogenicity just distal to the calcaneal insertion of the plantar fascia. This hypoechoic area appears to be in the substance of the plantar fascia itself likely representing a tear here. Plantar fascia measures 0.38 cm just distal to the calcaneus.       Assessment & Plan:  1. Persistent left foot pain secondary to plantar fasciitis with partial plantar fascial tear 2. Resolved right foot pain secondary to plantar fasciitis  Were going to try a trial of topical nitroglycerin. Quarter patch over the most painful area changed every 24 hours. She is cautioned about possibility of headache. She will bring in her green insoles and I would like to fit him with scaphoid pads and possibly a metatarsal pad. We will also try an arch strap.  Continue with her home exercises and daily icing. Followup with me in 4 weeks. Overall she feels like her left heel pain is improving with the exception of this one tender area. Patient will call me with questions or concerns in the interim. We will plan on repeating an ultrasound at her followup visit

## 2012-07-21 NOTE — Patient Instructions (Addendum)
It was nice to meet you today.  Remember to continue doing your exercises.  We have also given you an arch strap to wear throughout the day and at night.  For the partial tear of your plantar fascia we would like for you to place 1/4 of a Nitroglycerin patch (has been sent in to your pharmacy) daily.  Leave in place for 24 hours and change the following day.  When changing rotate location to avoid skin irritation.  You should place the patch as close to the area of most severe pain but don't overlap positions from day to day.  Follow up in 1 month.

## 2012-08-04 ENCOUNTER — Encounter: Payer: Self-pay | Admitting: Sports Medicine

## 2012-08-04 ENCOUNTER — Ambulatory Visit (INDEPENDENT_AMBULATORY_CARE_PROVIDER_SITE_OTHER): Payer: BC Managed Care – PPO | Admitting: Sports Medicine

## 2012-08-04 VITALS — BP 109/75 | HR 85 | Ht 66.0 in | Wt 165.0 lb

## 2012-08-04 DIAGNOSIS — M79673 Pain in unspecified foot: Secondary | ICD-10-CM

## 2012-08-04 DIAGNOSIS — M79609 Pain in unspecified limb: Secondary | ICD-10-CM

## 2012-08-04 DIAGNOSIS — M722 Plantar fascial fibromatosis: Secondary | ICD-10-CM

## 2012-08-04 NOTE — Progress Notes (Signed)
  Subjective:    Patient ID: Megan Combs, female    DOB: 1961/07/19, 51 y.o.   MRN: [id number]  HPI Megan Combs comes in today earlier than expected. Although she states that her left heel pain is approximately 5% improved on the topical nitroglycerin, she is concerned about her persistent standing may be prolonging her recovery. 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 She did not find a green sports insoles very helpful. Instead she modified her orthotic by adding the scaphoid pad to it. This has been more comfortable to her. She has been icing her heel daily and doing her home exercises. She's also been wearing her arch strap, but has not found it to be helpful.    Review of Systems     Objective:   Physical Exam Well-developed, well-nourished. No acute distress. Left foot: Flexible pes cavus foot with tenderness to palpation at the calcaneal insertion of the plantar fascia as well as a little more distally in the proximal arch of her foot. The swelling seen on her previous exam has improved. Negative calcaneal squeeze. She still walks with an obvious limp.       Assessment & Plan:  Persistent left heel pain secondary to plantar fasciitis with ultrasound evidence of a partial plantar fascial tear  Patient has only been on topical nitroglycerin for two-weeks and she has noticed an initial improvement in her symptoms. Nonetheless, I do think her job is contributing to her ongoing pain. For that reason I have given her a note to give to her employer stating that I would only like her to stand for one hour a day until followup with me in 4 weeks. At that time we will repeat her ultrasound. In the meantime, she is to continue with her current treatment including topical nitroglycerin, home exercise program, inserts with scaphoid pad, and daily icing. She will call with  questions or concerns in the interim.

## 2012-08-11 ENCOUNTER — Ambulatory Visit: Payer: BC Managed Care – PPO | Attending: Internal Medicine | Admitting: Physical Therapy

## 2012-08-11 DIAGNOSIS — IMO0001 Reserved for inherently not codable concepts without codable children: Secondary | ICD-10-CM | POA: Insufficient documentation

## 2012-08-11 DIAGNOSIS — M2420 Disorder of ligament, unspecified site: Secondary | ICD-10-CM | POA: Insufficient documentation

## 2012-08-11 DIAGNOSIS — M629 Disorder of muscle, unspecified: Secondary | ICD-10-CM | POA: Insufficient documentation

## 2012-08-14 ENCOUNTER — Encounter: Payer: Self-pay | Admitting: *Deleted

## 2012-08-18 ENCOUNTER — Ambulatory Visit: Payer: BC Managed Care – PPO | Admitting: Sports Medicine

## 2012-08-18 ENCOUNTER — Ambulatory Visit: Payer: BC Managed Care – PPO | Admitting: Physical Therapy

## 2012-08-19 ENCOUNTER — Ambulatory Visit: Payer: BC Managed Care – PPO | Admitting: Physical Therapy

## 2012-08-24 ENCOUNTER — Encounter: Payer: BC Managed Care – PPO | Admitting: Physical Therapy

## 2012-08-26 ENCOUNTER — Ambulatory Visit: Payer: BC Managed Care – PPO | Admitting: Physical Therapy

## 2012-09-01 ENCOUNTER — Ambulatory Visit: Payer: BC Managed Care – PPO | Attending: Internal Medicine | Admitting: Physical Therapy

## 2012-09-01 DIAGNOSIS — M629 Disorder of muscle, unspecified: Secondary | ICD-10-CM | POA: Insufficient documentation

## 2012-09-01 DIAGNOSIS — M2420 Disorder of ligament, unspecified site: Secondary | ICD-10-CM | POA: Insufficient documentation

## 2012-09-01 DIAGNOSIS — IMO0001 Reserved for inherently not codable concepts without codable children: Secondary | ICD-10-CM | POA: Insufficient documentation

## 2012-09-03 ENCOUNTER — Ambulatory Visit (INDEPENDENT_AMBULATORY_CARE_PROVIDER_SITE_OTHER): Payer: BC Managed Care – PPO | Admitting: Sports Medicine

## 2012-09-03 VITALS — BP 120/60 | Ht 65.0 in | Wt 165.0 lb

## 2012-09-03 DIAGNOSIS — M722 Plantar fascial fibromatosis: Secondary | ICD-10-CM

## 2012-09-04 NOTE — Progress Notes (Signed)
  Subjective:    Patient ID: Megan Combs, female    DOB: 06/05/1961, 51 y.o.   MRN: [id number]  HPI That comes in today for followup. Left foot and heel are feeling much better. She is 70% improved. She's been applying 1/4 nitroglycerin patch to the plantar fascia. She has also started taking diclofenac daily and has noticed some bruising as a side effect of this. She's been wearing her store-bought inserts with scaphoid pads. She finds these to be comfortable. She's been icing. She's been doing her plantar fascial stretches.   Review of Systems     Objective:   Physical Exam Well-developed, well-nourished. No acute distress.  Examination of each of her feet in the standing position shows pes planus and pronation with walking. She is separation of the second and third toes bilaterally with standing. Slight tenderness along the left plantar fascia just distal to the calcaneus but I do not appreciate any nodularities or swelling. No pain with calcaneal squeeze. She remains neurovascular intact distally.  MSK ultrasound of the left plantar fascia was performed. Images were compared to the previous study. Her plantar fascia now measures 0.39 cm which is within normal limits. He hypoechoic area seen on her previous scan is no longer appreciated.       Assessment & Plan:  1. Improving left heel pain secondary to plantar fasciitis  He hypoechoic area seen on her previous ultrasound has now resolved. Patient is doing very well clinically. I think she should continue with her current treatment plan for another month. However, I want her to be very careful with her diclofenac and she needs to discontinue it if she develops stomach upset or experiences excessive bruising. The plan at this time is for her to return in 4 weeks for custom orthotics. She would like to have a pair made for her tennis shoes as well as her dress shoes.

## 2012-09-30 ENCOUNTER — Encounter: Payer: Self-pay | Admitting: Internal Medicine

## 2012-09-30 ENCOUNTER — Ambulatory Visit (INDEPENDENT_AMBULATORY_CARE_PROVIDER_SITE_OTHER): Payer: BC Managed Care – PPO | Admitting: Internal Medicine

## 2012-09-30 VITALS — BP 134/76 | HR 95 | Temp 98.2°F | Resp 18 | Wt 157.0 lb

## 2012-09-30 DIAGNOSIS — N390 Urinary tract infection, site not specified: Secondary | ICD-10-CM

## 2012-09-30 LAB — POCT URINALYSIS DIPSTICK
Ketones, UA: NEGATIVE
Protein, UA: NEGATIVE
Spec Grav, UA: 1.02
pH, UA: 5

## 2012-09-30 MED ORDER — ESTRADIOL 0.52 MG/0.87 GM (0.06%) TD GEL: TRANSDERMAL | Status: DC

## 2012-09-30 NOTE — Progress Notes (Signed)
Subjective:    Patient ID: [name], female    DOB: 07-04-1961, 51 y.o.   MRN: [id number]  HPI Laelia is here for follow up after initiating HT in September.  She is on Vivelle dot and prometrium.  States her hot flushes have gone, she is not having irritative voiding symptoms and she is sleeping better at night.  Pt does report that she is having red rashes from vivelle dot adhesive.  Sometimes will see blisters.   She denies vaginal spotting     Allergies  Allergen Reactions  . Dulcolax (Bisacodyl)     Dehydration   . Penicillins Hives  . Tape Dermatitis  . Zithromax (Azithromycin) Rash   Past Medical History  Diagnosis Date  . Depression   . Arthritis     right knee  . Fibromyalgia   . Degenerative disc disease   . Fibromyalgia    Past Surgical History  Procedure Date  . Knee arthroscopy   . Tonsillectomy   . Laproscopy   . Knee arthroscopy    History   Social History  . Marital Status: Married    Spouse Name: N/A    Number of Children: N/A  . Years of Education: N/A   Occupational History  . RN    Social History Main Topics  . Smoking status: Never Smoker   . Smokeless tobacco: Never Used  . Alcohol Use: No  . Drug Use: No  . Sexually Active: Yes   Other Topics Concern  . Not on file   Social History Narrative  . No narrative on file   Family History  Problem Relation Age of Onset  . Colon cancer Father   . Esophageal cancer Father   . Cancer Father   . Stroke Father   . Heart disease Father   . Hypertension Father   . Hyperlipidemia Father   . Heart disease Mother   . Diabetes Mother   . Arthritis Mother   . Hypertension Sister   . Diabetes Maternal Grandfather    Patient Active Problem List  Diagnosis  . Family history of malignant neoplasm  . Personal history of colonic polyps  . Fibromyalgia  . History of anemia  . Depression  . Menopause  . Menopausal hot flushes  . DJD (degenerative joint disease)  . Other and  unspecified hyperlipidemia  . Vaginal dryness, menopausal  . Urge incontinence of urine  . Anxiety  . Plantar fasciitis   Current Outpatient Prescriptions on File Prior to Visit  Medication Sig Dispense Refill  . b complex vitamins tablet Take 1 tablet by mouth daily.      Marland Kitchen buPROPion (WELLBUTRIN XL) 150 MG 24 hr tablet Take 150 mg by mouth daily.      Marland Kitchen buPROPion (WELLBUTRIN XL) 300 MG 24 hr tablet Take 300 mg by mouth daily.      . Calcium Carbonate-Vitamin D (CALCIUM 500 + D) 500-125 MG-UNIT TABS Take 1 tablet by mouth daily.      . diclofenac sodium (VOLTAREN) 1 % GEL aaa tid-qid  100 g  2  . estradiol (VIVELLE-DOT) 0.05 MG/24HR Change patch twice a week  8 patch  2  . fish oil-omega-3 fatty acids 1000 MG capsule Take 1 g by mouth daily.      Marland Kitchen glucosamine-chondroitin 500-400 MG tablet Take 3 tablets by mouth daily.      . Magnesium 250 MG TABS Take 500 mg by mouth daily.      . Multiple Vitamin (  MULITIVITAMIN WITH MINERALS) TABS Take 1 tablet by mouth daily.      . nitroGLYCERIN (NITRODUR - DOSED IN MG/24 HR) 0.2 mg/hr Use 1/4 patch daily  30 patch  2  . progesterone (PROMETRIUM) 100 MG capsule Take 1 capsule (100 mg total) by mouth daily.  30 capsule  2      Review of Systems    see HPI Objective:   Physical Exam Physical Exam  Nursing note and vitals reviewed.  Constitutional: She is oriented to person, place, and time. She appears well-developed and well-nourished.  HENT:  Head: Normocephalic and atraumatic.  Cardiovascular: Normal rate and regular rhythm. Exam reveals no gallop and no friction rub.  No murmur heard.  Pulmonary/Chest: Breath sounds normal. She has no wheezes. She has no rales.  Neurological: She is alert and oriented to person, place, and time.  Skin: Skin is warm and dry. She has some resolving  Redness at a prior vivielle dot patch site  No blister Psychiatric: She has a normal mood and affect. Her behavior is normal.         Assessment & Plan:   Symptomatic menopause  Will change vivelle dot to estradiol gel.  Continue prometrium.  I again counseled pt she is to notify me if any lower extremity swelling or vaginal spotting.  She voices understanding  Urinary urgency  U/A normal today  See me for CPE

## 2012-09-30 NOTE — Patient Instructions (Addendum)
Activate My chart  [email or web address]  websit

## 2012-10-07 ENCOUNTER — Other Ambulatory Visit: Payer: Self-pay | Admitting: *Deleted

## 2012-10-07 ENCOUNTER — Encounter: Payer: BC Managed Care – PPO | Admitting: Sports Medicine

## 2012-10-07 MED ORDER — PROGESTERONE MICRONIZED 100 MG PO CAPS: 100.0000 mg | ORAL_CAPSULE | Freq: Every day | ORAL | Status: DC

## 2012-10-07 NOTE — Telephone Encounter (Signed)
Needs refill

## 2012-10-14 ENCOUNTER — Ambulatory Visit (INDEPENDENT_AMBULATORY_CARE_PROVIDER_SITE_OTHER): Payer: BC Managed Care – PPO | Admitting: Sports Medicine

## 2012-10-14 ENCOUNTER — Encounter: Payer: Self-pay | Admitting: Sports Medicine

## 2012-10-14 VITALS — BP 109/77 | HR 81

## 2012-10-14 DIAGNOSIS — Q667 Congenital pes cavus, unspecified foot: Secondary | ICD-10-CM

## 2012-10-14 DIAGNOSIS — M722 Plantar fascial fibromatosis: Secondary | ICD-10-CM

## 2012-10-14 NOTE — Progress Notes (Signed)
Patient ID: Megan Combs, female   DOB: [birth year], 51 y.o.   MRN: [id number]  Megan Combs comes in today for orthotics. Her plantar fascial pain is much better. She was recently on an oral prednisone Dosepak for UTI and that has helped her pain tremendously. She is still on nitroglycerin and has been on this for 3 months. Still taking diclofenac as well.  Physical exam of the left heel shows a small palpable nodule at the Mid substance of the plantar fascia. Minimally painful. She walks without a limp.  1. Plantar fasciitis 2. His cavus foot  Custom orthotics were constructed. Megan Combs was having a little discomfort along the medial aspect of her foot so I trimmed down the blue base which did seem to make her more comfortable. She was complaining of a feeling of "my sock bunched up underneath my foot". I had her remove her sock in and the feeling resolved. She may need to wear thinner socks.  she will discontinue her topical nitroglycerin and will wean from diclofenac. Continue with her arch straps and plantar fascial exercises. Followup in 3 weeks but she will let he know if she has any discomfort with her in orthotics in the interim.  Patient was fitted for a : standard, cushioned, semi-rigid orthotic. The orthotic was heated and afterward the patient stood on the orthotic blank positioned on the orthotic stand. The patient was positioned in subtalar neutral position and 10 degrees of ankle dorsiflexion in a weight bearing stance. After completion of molding, a stable base was applied to the orthotic blank. The blank was ground to a stable position for weight bearing. Size:8 Base: [employer] and [place]:none

## 2012-11-02 ENCOUNTER — Telehealth: Payer: Self-pay | Admitting: *Deleted

## 2012-11-02 NOTE — Telephone Encounter (Signed)
Pt called concerned about frequent hot flashes and inability to sleep appt made for Wed 1/8 at 1:30

## 2012-11-04 ENCOUNTER — Ambulatory Visit (INDEPENDENT_AMBULATORY_CARE_PROVIDER_SITE_OTHER): Payer: BC Managed Care – PPO | Admitting: Internal Medicine

## 2012-11-04 ENCOUNTER — Encounter: Payer: Self-pay | Admitting: Internal Medicine

## 2012-11-04 ENCOUNTER — Encounter: Payer: Self-pay | Admitting: Sports Medicine

## 2012-11-04 ENCOUNTER — Ambulatory Visit (INDEPENDENT_AMBULATORY_CARE_PROVIDER_SITE_OTHER): Payer: BC Managed Care – PPO | Admitting: Sports Medicine

## 2012-11-04 VITALS — BP 115/85 | HR 88 | Ht 66.0 in | Wt 155.0 lb

## 2012-11-04 VITALS — BP 126/70 | HR 92 | Temp 98.1°F | Resp 16 | Wt 162.0 lb

## 2012-11-04 DIAGNOSIS — Z780 Asymptomatic menopausal state: Secondary | ICD-10-CM

## 2012-11-04 DIAGNOSIS — N951 Menopausal and female climacteric states: Secondary | ICD-10-CM

## 2012-11-04 DIAGNOSIS — F329 Major depressive disorder, single episode, unspecified: Secondary | ICD-10-CM

## 2012-11-04 DIAGNOSIS — Q667 Congenital pes cavus, unspecified foot: Secondary | ICD-10-CM

## 2012-11-04 DIAGNOSIS — F3289 Other specified depressive episodes: Secondary | ICD-10-CM

## 2012-11-04 DIAGNOSIS — F32A Depression, unspecified: Secondary | ICD-10-CM

## 2012-11-04 NOTE — Progress Notes (Signed)
  Subjective:    Patient ID: Megan Combs, female    DOB: 1961-06-18, 52 y.o.   MRN: [id number]  HPI Megan Combs comes in today for followup. Plantar fascial pain has nearly resolved. She is no longer using topical nitroglycerin. She is no longer needing NSAIDs. She does continue to use her arch strap. She is very pleased with her custom orthotics and is requesting a second pair for her dress shoes.    Review of Systems     Objective:   Physical Exam Well-developed, well-nourished  Bilateral pes cavus. Minimal tenderness to palpation at the calcaneal insertion of the left plantar fascia. There is still a small palpable fibroma at the proximal plantar fascia but it is minimally tender. Walking without a limp.       Assessment & Plan:  1. Bilateral pes cavus 2. Resolved plantar fasciitis  Patient will return to the office later this week for another pair of custom orthotic. This time we will construct orthotics for her dress shoes. She can start to discontinue her arch strap. She understands the importance of continuing with her plantar fascial stretches. She continue to increase activity as tolerated.

## 2012-11-04 NOTE — Patient Instructions (Addendum)
Keep CPE appt with me 

## 2012-11-04 NOTE — Progress Notes (Signed)
Subjective:    Patient ID: Megan Combs, female    DOB: 06-25-1961, 52 y.o.   MRN: [id number]  HPI Safina is here after switching to Elestrin gel since Vivelle dot gave her a skin reaction.  She is using twice a week but has not had as much relief from hot flushes with gel.  She is using two pumps twice a week  No vaginal spotting no Le Edema  Allergies  Allergen Reactions  . Dulcolax (Bisacodyl)     Dehydration   . Penicillins Hives  . Tape Dermatitis  . Zithromax (Azithromycin) Rash   Past Medical History  Diagnosis Date  . Depression   . Arthritis     right knee  . Fibromyalgia   . Degenerative disc disease   . Fibromyalgia    Past Surgical History  Procedure Date  . Knee arthroscopy   . Tonsillectomy   . Laproscopy   . Knee arthroscopy    History   Social History  . Marital Status: Married    Spouse Name: N/A    Number of Children: N/A  . Years of Education: N/A   Occupational History  . RN    Social History Main Topics  . Smoking status: Never Smoker   . Smokeless tobacco: Never Used  . Alcohol Use: No  . Drug Use: No  . Sexually Active: Yes   Other Topics Concern  . Not on file   Social History Narrative  . No narrative on file   Family History  Problem Relation Age of Onset  . Colon cancer Father   . Esophageal cancer Father   . Cancer Father   . Stroke Father   . Heart disease Father   . Hypertension Father   . Hyperlipidemia Father   . Heart disease Mother   . Diabetes Mother   . Arthritis Mother   . Hypertension Sister   . Diabetes Maternal Grandfather    Patient Active Problem List  Diagnosis  . Family history of malignant neoplasm  . Personal history of colonic polyps  . Fibromyalgia  . History of anemia  . Depression  . Menopause  . Menopausal hot flushes  . DJD (degenerative joint disease)  . Other and unspecified hyperlipidemia  . Vaginal dryness, menopausal  . Urge incontinence of urine  . Anxiety  . Plantar  fasciitis   Current Outpatient Prescriptions on File Prior to Visit  Medication Sig Dispense Refill  . b complex vitamins tablet Take 1 tablet by mouth daily.      Marland Kitchen buPROPion (WELLBUTRIN XL) 150 MG 24 hr tablet Take 150 mg by mouth daily.      Marland Kitchen buPROPion (WELLBUTRIN XL) 300 MG 24 hr tablet Take 300 mg by mouth daily.      . Calcium Carbonate-Vitamin D (CALCIUM 500 + D) 500-125 MG-UNIT TABS Take 1 tablet by mouth daily.      . diclofenac sodium (VOLTAREN) 1 % GEL aaa tid-qid  100 g  2  . Estradiol 0.52 MG/0.87 GM (0.06%) GEL Use 2 pumps on are twice a week.  Give 2 bottles  2 Bottle  1  . fish oil-omega-3 fatty acids 1000 MG capsule Take 1 g by mouth daily.      Marland Kitchen glucosamine-chondroitin 500-400 MG tablet Take 3 tablets by mouth daily.      . Magnesium 250 MG TABS Take 500 mg by mouth daily.      . Multiple Vitamin (MULITIVITAMIN WITH MINERALS) TABS Take 1 tablet by  mouth daily.      . nitroGLYCERIN (NITRODUR - DOSED IN MG/24 HR) 0.2 mg/hr Use 1/4 patch daily  30 patch  2  . progesterone (PROMETRIUM) 100 MG capsule Take 1 capsule (100 mg total) by mouth daily.  30 capsule  2       Review of Systems See HPI    Objective:   Physical Exam Physical Exam  Nursing note and vitals reviewed.  Constitutional: She is oriented to person, place, and time. She appears well-developed and well-nourished.  HENT:  Head: Normocephalic and atraumatic.  Cardiovascular: Normal rate and regular rhythm. Exam reveals no gallop and no friction rub.  No murmur heard.  Pulmonary/Chest: Breath sounds normal. She has no wheezes. She has no rales.  Neurological: She is alert and oriented to person, place, and time.  Skin: Skin is warm and dry.  Psychiatric: She has a normal mood and affect. Her behavior is normal.  Ext  No edema        Assessment & Plan:  Menopausal hot flushes  OK to take Elestrin   .06%  Tid for now.  She has appt for CPE in Feburary  Depression  Well controlled with Wellbutrin

## 2012-11-06 ENCOUNTER — Encounter: Payer: BC Managed Care – PPO | Admitting: Sports Medicine

## 2012-11-12 ENCOUNTER — Telehealth: Payer: Self-pay | Admitting: *Deleted

## 2012-11-12 NOTE — Telephone Encounter (Signed)
Left message regarding fax from [facility] awaiting return call

## 2012-11-18 ENCOUNTER — Encounter: Payer: Self-pay | Admitting: Sports Medicine

## 2012-11-18 ENCOUNTER — Ambulatory Visit (INDEPENDENT_AMBULATORY_CARE_PROVIDER_SITE_OTHER): Payer: BC Managed Care – PPO | Admitting: Sports Medicine

## 2012-11-18 VITALS — BP 124/85 | HR 89 | Ht 66.0 in | Wt 162.0 lb

## 2012-11-18 DIAGNOSIS — M722 Plantar fascial fibromatosis: Secondary | ICD-10-CM

## 2012-11-18 NOTE — Progress Notes (Signed)
Patient ID: Megan Combs, female   DOB: 10-Dec-1960, 52 y.o.   MRN: [id number]  Patient comes in today to have custom orthotics constructed for her dress shoes. She has found her other orthotics very comfortable in her tennis shoes. Her plantar fascial pain is much improved but she still has intermittent discomfort in her heel. She would like to return for a little more physical therapy, this time at [facility] outpatient rehabilitation. Physical exam: Not repeated  1. Plantar fasciitis  Custom orthotics for her dress shoes were constructed. Prescription for physical therapy was provided. Followup when necessary.  Patient was fitted for a : standard, cushioned, semi-rigid orthotic. The orthotic was heated and afterward the patient stood on the orthotic blank positioned on the orthotic stand. The patient was positioned in subtalar neutral position and 10 degrees of ankle dorsiflexion in a weight bearing stance. After completion of molding, a stable base was applied to the orthotic blank. The blank was ground to a stable position for weight bearing. Size:7 Base: [employer] and [place]: None

## 2012-11-20 ENCOUNTER — Other Ambulatory Visit: Payer: Self-pay | Admitting: *Deleted

## 2012-11-20 DIAGNOSIS — M722 Plantar fascial fibromatosis: Secondary | ICD-10-CM

## 2012-11-26 ENCOUNTER — Ambulatory Visit: Payer: BC Managed Care – PPO | Attending: Sports Medicine | Admitting: Physical Therapy

## 2012-11-26 ENCOUNTER — Telehealth: Payer: Self-pay | Admitting: *Deleted

## 2012-11-26 DIAGNOSIS — IMO0001 Reserved for inherently not codable concepts without codable children: Secondary | ICD-10-CM | POA: Insufficient documentation

## 2012-11-26 DIAGNOSIS — M25579 Pain in unspecified ankle and joints of unspecified foot: Secondary | ICD-10-CM | POA: Insufficient documentation

## 2012-11-26 DIAGNOSIS — M25676 Stiffness of unspecified foot, not elsewhere classified: Secondary | ICD-10-CM | POA: Insufficient documentation

## 2012-11-26 DIAGNOSIS — M25673 Stiffness of unspecified ankle, not elsewhere classified: Secondary | ICD-10-CM | POA: Insufficient documentation

## 2012-11-30 ENCOUNTER — Ambulatory Visit: Payer: BC Managed Care – PPO | Attending: Sports Medicine | Admitting: Physical Therapy

## 2012-11-30 DIAGNOSIS — M25676 Stiffness of unspecified foot, not elsewhere classified: Secondary | ICD-10-CM | POA: Insufficient documentation

## 2012-11-30 DIAGNOSIS — M25673 Stiffness of unspecified ankle, not elsewhere classified: Secondary | ICD-10-CM | POA: Insufficient documentation

## 2012-11-30 DIAGNOSIS — M25579 Pain in unspecified ankle and joints of unspecified foot: Secondary | ICD-10-CM | POA: Insufficient documentation

## 2012-11-30 DIAGNOSIS — IMO0001 Reserved for inherently not codable concepts without codable children: Secondary | ICD-10-CM | POA: Insufficient documentation

## 2012-12-03 ENCOUNTER — Ambulatory Visit: Payer: BC Managed Care – PPO | Admitting: Physical Therapy

## 2012-12-03 ENCOUNTER — Telehealth: Payer: Self-pay | Admitting: *Deleted

## 2012-12-03 NOTE — Telephone Encounter (Signed)
Medication called in to pharmacy.

## 2012-12-03 NOTE — Telephone Encounter (Signed)
Returned call regarding insurance form that needs to be filled out by PCP no answer waiting return call

## 2012-12-07 ENCOUNTER — Ambulatory Visit: Payer: BC Managed Care – PPO | Admitting: Physical Therapy

## 2012-12-09 ENCOUNTER — Ambulatory Visit: Payer: BC Managed Care – PPO | Admitting: Physical Therapy

## 2012-12-10 ENCOUNTER — Encounter: Payer: BC Managed Care – PPO | Admitting: Physical Therapy

## 2012-12-14 ENCOUNTER — Ambulatory Visit: Payer: BC Managed Care – PPO | Admitting: Physical Therapy

## 2012-12-17 ENCOUNTER — Ambulatory Visit: Payer: BC Managed Care – PPO | Admitting: Physical Therapy

## 2012-12-21 ENCOUNTER — Ambulatory Visit
Admission: RE | Admit: 2012-12-21 | Discharge: 2012-12-21 | Disposition: A | Discharge status: Home / Self Care | Payer: BC Managed Care – PPO | Source: Ambulatory Visit | Attending: Sports Medicine | Admitting: Sports Medicine

## 2012-12-21 ENCOUNTER — Ambulatory Visit: Payer: BC Managed Care – PPO | Admitting: Physical Therapy

## 2012-12-21 ENCOUNTER — Ambulatory Visit (INDEPENDENT_AMBULATORY_CARE_PROVIDER_SITE_OTHER): Payer: BC Managed Care – PPO | Admitting: Sports Medicine

## 2012-12-21 VITALS — BP 127/82 | Ht 66.5 in | Wt 158.0 lb

## 2012-12-21 DIAGNOSIS — M25569 Pain in unspecified knee: Secondary | ICD-10-CM

## 2012-12-21 DIAGNOSIS — M25561 Pain in right knee: Secondary | ICD-10-CM

## 2012-12-21 MED ORDER — DICLOFENAC SODIUM 75 MG PO TBEC: 75.0000 mg | DELAYED_RELEASE_TABLET | Freq: Two times a day (BID) | ORAL | Status: DC | PRN

## 2012-12-21 NOTE — Progress Notes (Signed)
  Subjective:    Patient ID: Megan Combs, female    DOB: Mar 10, 1961, 52 y.o.   MRN: [id number]  HPI chief complaint: Right knee pain  Richardine comes in today complaining of 2 weeks of right knee pain. 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 Symptoms have him start with him bad enough that she consider arthroscopy but she never went through with the surgery. She's been relatively asymptomatic up until 2 weeks ago. She's been doing some rehabilitation for her plantar fasciitis and while walking up on an elliptical she began to notice some swelling in her knee. Swelling resolved but returned over the weekend after she did some dancing. She noted a significant amount of swelling at that time. Pain is diffuse in the knee. She's been taking ibuprofen as well as Voltaren. Voltaren seems to help more. She's also been wearing an off the shelf knee sleeve. She denies any recent trauma.  Medical history is unchanged from her last office visit    Review of Systems     Objective:   Physical Exam Well-developed, well-nourished. No acute distress. Awake alert and oriented x3  Right knee: Full range of motion. Trace effusion. There is tenderness to palpation along both medial and lateral joint lines. Negative McMurray's. Knee is stable to ligamentous exam. Neurovascularly intact distally. Walking with a slight limp.       Assessment & Plan:  1. Right knee pain and swelling likely secondary to OCD  Plain x-rays including an AP, lateral, sunrise, and tunnel view. I've asked her to fax over a copy of her MRI report. I've given her a body helix knee sleeve. She will continue Voltaren 75 mg twice daily with food as needed. I discussed the possibility of a cortisone injection if symptoms persist over the next week or two. I will call her after I reviewed her x-rays.

## 2012-12-22 ENCOUNTER — Telehealth: Payer: Self-pay | Admitting: Sports Medicine

## 2012-12-22 NOTE — Telephone Encounter (Signed)
I left a message today on Megan Combs's voicemail concerning x-rays of her right knee. Her OCD lesion is not visible on plain films. I recommended that she proceed with her treatment as discussed at yesterday's office visit. If symptoms persist we will consider the merits of a cortisone injection.

## 2012-12-24 ENCOUNTER — Ambulatory Visit (HOSPITAL_BASED_OUTPATIENT_CLINIC_OR_DEPARTMENT_OTHER)
Admission: RE | Admit: 2012-12-24 | Discharge: 2012-12-24 | Disposition: A | Discharge status: Home / Self Care | Payer: BC Managed Care – PPO | Source: Ambulatory Visit | Attending: Internal Medicine | Admitting: Internal Medicine

## 2012-12-24 ENCOUNTER — Other Ambulatory Visit: Payer: Self-pay | Admitting: Internal Medicine

## 2012-12-24 ENCOUNTER — Ambulatory Visit (INDEPENDENT_AMBULATORY_CARE_PROVIDER_SITE_OTHER): Payer: BC Managed Care – PPO | Admitting: Internal Medicine

## 2012-12-24 ENCOUNTER — Encounter: Payer: Self-pay | Admitting: Internal Medicine

## 2012-12-24 VITALS — BP 120/70 | HR 89 | Temp 98.6°F | Resp 16 | Wt 155.0 lb

## 2012-12-24 DIAGNOSIS — Z139 Encounter for screening, unspecified: Secondary | ICD-10-CM

## 2012-12-24 DIAGNOSIS — N3941 Urge incontinence: Secondary | ICD-10-CM

## 2012-12-24 DIAGNOSIS — F329 Major depressive disorder, single episode, unspecified: Secondary | ICD-10-CM

## 2012-12-24 DIAGNOSIS — N951 Menopausal and female climacteric states: Secondary | ICD-10-CM

## 2012-12-24 DIAGNOSIS — Z1231 Encounter for screening mammogram for malignant neoplasm of breast: Secondary | ICD-10-CM | POA: Insufficient documentation

## 2012-12-24 DIAGNOSIS — Z0000 Encounter for general adult medical examination without abnormal findings: Secondary | ICD-10-CM

## 2012-12-24 DIAGNOSIS — M1990 Unspecified osteoarthritis, unspecified site: Secondary | ICD-10-CM

## 2012-12-24 DIAGNOSIS — IMO0001 Reserved for inherently not codable concepts without codable children: Secondary | ICD-10-CM

## 2012-12-24 DIAGNOSIS — F3289 Other specified depressive episodes: Secondary | ICD-10-CM

## 2012-12-24 DIAGNOSIS — E785 Hyperlipidemia, unspecified: Secondary | ICD-10-CM

## 2012-12-24 DIAGNOSIS — F32A Depression, unspecified: Secondary | ICD-10-CM

## 2012-12-24 DIAGNOSIS — Z86010 Personal history of colonic polyps: Secondary | ICD-10-CM

## 2012-12-24 DIAGNOSIS — M797 Fibromyalgia: Secondary | ICD-10-CM

## 2012-12-24 DIAGNOSIS — M2390 Unspecified internal derangement of unspecified knee: Secondary | ICD-10-CM | POA: Insufficient documentation

## 2012-12-24 LAB — POCT URINALYSIS DIPSTICK
Ketones, UA: NEGATIVE
Protein, UA: NEGATIVE
Spec Grav, UA: 1.01
Urobilinogen, UA: NEGATIVE
pH, UA: 7

## 2012-12-24 MED ORDER — ESTRADIOL 0.52 MG/0.87 GM (0.06%) TD GEL: TRANSDERMAL | Status: DC

## 2012-12-24 NOTE — Progress Notes (Signed)
Subjective:    Patient ID: [name], female    DOB: 1961/01/28, 52 y.o.   MRN: [id number]  HPI Toshiye is here for CPE.  Hot flushes  Markedly improved on estradiol gel 3 and daily prometrium times per week.  No vaginal spotting no leg swelling  Memory  Had some problems with short term memory but states her psychaitrist  Dr. Toni Arthurs changed her to Buspar and memory issues have improved   Hyperlipidemia  Pt states she has changed her diet to more plant based foods  Allergies  Allergen Reactions  . Dulcolax (Bisacodyl)     Dehydration   . Penicillins Hives  . Tape Dermatitis  . Zithromax (Azithromycin) Rash   Past Medical History  Diagnosis Date  . Depression   . Arthritis     right knee  . Fibromyalgia   . Degenerative disc disease   . Fibromyalgia    Past Surgical History  Procedure Laterality Date  . Knee arthroscopy    . Tonsillectomy    . Laproscopy    . Knee arthroscopy     History   Social History  . Marital Status: Married    Spouse Name: N/A    Number of Children: N/A  . Years of Education: N/A   Occupational History  . RN    Social History Main Topics  . Smoking status: Never Smoker   . Smokeless tobacco: Never Used  . Alcohol Use: No  . Drug Use: No  . Sexually Active: Yes   Other Topics Concern  . Not on file   Social History Narrative  . No narrative on file   Family History  Problem Relation Age of Onset  . Colon cancer Father   . Esophageal cancer Father   . Cancer Father   . Stroke Father   . Heart disease Father   . Hypertension Father   . Hyperlipidemia Father   . Heart disease Mother   . Diabetes Mother   . Arthritis Mother   . Hypertension Sister   . Diabetes Maternal Grandfather    Patient Active Problem List  Diagnosis  . Family history of malignant neoplasm  . Personal history of colonic polyps  . Fibromyalgia  . History of anemia  . Depression  . Menopause  . Menopausal hot flushes  . DJD (degenerative joint  disease)  . Other and unspecified hyperlipidemia  . Vaginal dryness, menopausal  . Urge incontinence of urine  . Anxiety  . Plantar fasciitis   Current Outpatient Prescriptions on File Prior to Visit  Medication Sig Dispense Refill  . b complex vitamins tablet Take 1 tablet by mouth daily.      Marland Kitchen buPROPion (WELLBUTRIN XL) 150 MG 24 hr tablet Take 150 mg by mouth daily.      Marland Kitchen buPROPion (WELLBUTRIN XL) 300 MG 24 hr tablet Take 300 mg by mouth daily.      . Calcium Carbonate-Vitamin D (CALCIUM 500 + D) 500-125 MG-UNIT TABS Take 1 tablet by mouth daily.      . diclofenac (VOLTAREN) 75 MG EC tablet Take 1 tablet (75 mg total) by mouth 2 (two) times daily as needed. Take with food  60 tablet  1  . diclofenac sodium (VOLTAREN) 1 % GEL aaa tid-qid  100 g  2  . Estradiol 0.52 MG/0.87 GM (0.06%) GEL Use 2 pumps on are twice a week.  Give 2 bottles  2 Bottle  1  . fish oil-omega-3 fatty acids 1000 MG capsule  Take 1 g by mouth daily.      Marland Kitchen glucosamine-chondroitin 500-400 MG tablet Take 3 tablets by mouth daily.      . Magnesium 250 MG TABS Take 500 mg by mouth daily.      . Multiple Vitamin (MULITIVITAMIN WITH MINERALS) TABS Take 1 tablet by mouth daily.      . nitroGLYCERIN (NITRODUR - DOSED IN MG/24 HR) 0.2 mg/hr Use 1/4 patch daily  30 patch  2  . progesterone (PROMETRIUM) 100 MG capsule Take 1 capsule (100 mg total) by mouth daily.  30 capsule  2   No current facility-administered medications on file prior to visit.       Review of Systems See HPI    Objective:   Physical Exam  Physical Exam  Nursing note and vitals reviewed.  Constitutional: She is oriented to person, place, and time. She appears well-developed and well-nourished.  HENT:  Head: Normocephalic and atraumatic.  Right Ear: Tympanic membrane and ear canal normal. No drainage. Tympanic membrane is not injected and not erythematous.  Left Ear: Tympanic membrane and ear canal normal. No drainage. Tympanic membrane is not  injected and not erythematous.  Nose: Nose normal. Right sinus exhibits no maxillary sinus tenderness and no frontal sinus tenderness. Left sinus exhibits no maxillary sinus tenderness and no frontal sinus tenderness.  Mouth/Throat: Oropharynx is clear and moist. No oral lesions. No oropharyngeal exudate.  Eyes: Conjunctivae and EOM are normal. Pupils are equal, round, and reactive to light.  Neck: Normal range of motion. Neck supple. No JVD present. Carotid bruit is not present. No mass and no thyromegaly present.  Cardiovascular: Normal rate, regular rhythm, S1 normal, S2 normal and intact distal pulses. Exam reveals no gallop and no friction rub.  No murmur heard.  Pulses:  Carotid pulses are 2+ on the right side, and 2+ on the left side.  Dorsalis pedis pulses are 2+ on the right side, and 2+ on the left side.  No carotid bruit. No LE edema  Pulmonary/Chest: Breath sounds normal. She has no wheezes. She has no rales. She exhibits no tenderness. Breasts  No discrete masses no nipple discharge no axillary adenopathy bilaterally Abdominal: Soft. Bowel sounds are normal. She exhibits no distension and no mass. There is no hepatosplenomegaly. There is no tenderness. There is no CVA tenderness.  Rectal no mass guaiac neg Musculoskeletal: Normal range of motion.  No active synovitis to joints.  Lymphadenopathy:  She has no cervical adenopathy.  She has no axillary adenopathy.  Right: No inguinal and no supraclavicular adenopathy present.  Left: No inguinal and no supraclavicular adenopathy present.  Neurological: She is alert and oriented to person, place, and time. She has normal strength and normal reflexes. She displays no tremor. No cranial nerve deficit or sensory deficit. Coordination and gait normal.  Skin: Skin is warm and dry. No rash noted. No cyanosis. Nails show no clubbing.  Psychiatric: She has a normal mood and affect. Her speech is normal and behavior is normal. Cognition and  memory are normal.          Assessment & Plan:  Health Maintenance  Mm today  Labs today  Info given for shingles vaccine  See scanned note  Hyperlipidemia check today  Menopausal hot flushes  Continue Estradiol and prometrium  DJD  History of lumbar disc bulge she is asymptomatic now  History of colonic polyps  utd with colonoscopy DrChristella Hartigan  Plantar fasciitis  Cartilage derangement R knee  Managed Dr.  Margaretha Sheffield

## 2012-12-24 NOTE — Patient Instructions (Addendum)
Check labs on My chart

## 2012-12-26 LAB — COMPREHENSIVE METABOLIC PANEL
ALT: 17 U/L (ref 0–35)
AST: 27 U/L (ref 0–37)
Albumin: 4.2 g/dL (ref 3.5–5.2)
Alkaline Phosphatase: 122 U/L — ABNORMAL HIGH (ref 39–117)
Potassium: 4.7 mEq/L (ref 3.5–5.3)
Sodium: 142 mEq/L (ref 135–145)
Total Protein: 7.1 g/dL (ref 6.0–8.3)

## 2012-12-26 LAB — CBC WITH DIFFERENTIAL/PLATELET
Eosinophils Absolute: 0.2 10*3/uL (ref 0.0–0.7)
Eosinophils Relative: 3 % (ref 0–5)
Lymphs Abs: 2.6 10*3/uL (ref 0.7–4.0)
MCH: 30.8 pg (ref 26.0–34.0)
MCV: 91.6 fL (ref 78.0–100.0)
Platelets: 237 10*3/uL (ref 150–400)
RBC: 4.38 MIL/uL (ref 3.87–5.11)

## 2012-12-26 LAB — LIPID PANEL
Cholesterol: 197 mg/dL (ref 0–200)
Total CHOL/HDL Ratio: 2.6 Ratio
VLDL: 16 mg/dL (ref 0–40)

## 2012-12-29 ENCOUNTER — Telehealth: Payer: Self-pay | Admitting: *Deleted

## 2012-12-29 ENCOUNTER — Telehealth: Payer: Self-pay | Admitting: Internal Medicine

## 2012-12-29 NOTE — Telephone Encounter (Signed)
[facility] labs notified an ANA and mitochondrial antibody added to previous labs

## 2012-12-29 NOTE — Telephone Encounter (Signed)
Message copied by Mathews Robinsons on Tue Dec 29, 2012  3:36 PM ------      Message from: Raechel Chute D      Created: Tue Dec 29, 2012  2:03 PM       Karen Kitchens            Add a mitochondrial antibody and ANA to labs already done ------

## 2012-12-29 NOTE — Telephone Encounter (Signed)
Left message on pts mobile to call office regarding lab results 

## 2012-12-29 NOTE — Telephone Encounter (Signed)
Message copied by Mathews Robinsons on Tue Dec 29, 2012  3:39 PM ------      Message from: Raechel Chute D      Created: Tue Dec 29, 2012  2:03 PM       Karen Kitchens            Add a mitochondrial antibody and ANA to labs already done ------

## 2012-12-30 ENCOUNTER — Encounter: Payer: Self-pay | Admitting: *Deleted

## 2012-12-30 ENCOUNTER — Telehealth: Payer: Self-pay | Admitting: Internal Medicine

## 2012-12-30 DIAGNOSIS — R748 Abnormal levels of other serum enzymes: Secondary | ICD-10-CM

## 2012-12-30 LAB — ANA: Anti Nuclear Antibody(ANA): NEGATIVE

## 2012-12-30 NOTE — Telephone Encounter (Signed)
Spoke with pt and informed of elevateed alk phos.  She reports this has been elevated in the past.  May be drug effect but will check liver ultrasound

## 2012-12-31 ENCOUNTER — Encounter: Payer: Self-pay | Admitting: *Deleted

## 2013-01-01 ENCOUNTER — Ambulatory Visit (HOSPITAL_BASED_OUTPATIENT_CLINIC_OR_DEPARTMENT_OTHER): Payer: BC Managed Care – PPO

## 2013-01-01 ENCOUNTER — Ambulatory Visit (HOSPITAL_BASED_OUTPATIENT_CLINIC_OR_DEPARTMENT_OTHER)
Admission: RE | Admit: 2013-01-01 | Discharge: 2013-01-01 | Disposition: A | Discharge status: Home / Self Care | Payer: BC Managed Care – PPO | Source: Ambulatory Visit | Attending: Internal Medicine | Admitting: Internal Medicine

## 2013-01-01 DIAGNOSIS — R748 Abnormal levels of other serum enzymes: Secondary | ICD-10-CM

## 2013-01-01 DIAGNOSIS — N281 Cyst of kidney, acquired: Secondary | ICD-10-CM | POA: Insufficient documentation

## 2013-01-01 DIAGNOSIS — R7989 Other specified abnormal findings of blood chemistry: Secondary | ICD-10-CM | POA: Insufficient documentation

## 2013-01-03 ENCOUNTER — Encounter: Payer: Self-pay | Admitting: Internal Medicine

## 2013-01-03 DIAGNOSIS — R748 Abnormal levels of other serum enzymes: Secondary | ICD-10-CM | POA: Insufficient documentation

## 2013-01-03 DIAGNOSIS — Z800 Family history of malignant neoplasm of digestive organs: Secondary | ICD-10-CM | POA: Insufficient documentation

## 2013-01-04 ENCOUNTER — Telehealth: Payer: Self-pay | Admitting: Internal Medicine

## 2013-01-04 NOTE — Telephone Encounter (Signed)
Left message on pt cell to calloffice regarding ultrasound results

## 2013-01-06 ENCOUNTER — Telehealth: Payer: Self-pay | Admitting: Internal Medicine

## 2013-01-06 DIAGNOSIS — N281 Cyst of kidney, acquired: Secondary | ICD-10-CM | POA: Insufficient documentation

## 2013-01-06 NOTE — Telephone Encounter (Signed)
Spoke with pt and informed of ultrasound results with renal cyst

## 2013-01-18 ENCOUNTER — Ambulatory Visit (INDEPENDENT_AMBULATORY_CARE_PROVIDER_SITE_OTHER): Payer: BC Managed Care – PPO | Admitting: Internal Medicine

## 2013-01-18 ENCOUNTER — Encounter: Payer: Self-pay | Admitting: Internal Medicine

## 2013-01-18 ENCOUNTER — Other Ambulatory Visit: Payer: Self-pay | Admitting: *Deleted

## 2013-01-18 VITALS — BP 142/90 | HR 90 | Temp 98.3°F | Resp 18 | Wt 157.0 lb

## 2013-01-18 DIAGNOSIS — L989 Disorder of the skin and subcutaneous tissue, unspecified: Secondary | ICD-10-CM

## 2013-01-18 MED ORDER — NYSTATIN-TRIAMCINOLONE 100000-0.1 UNIT/GM-% EX OINT: TOPICAL_OINTMENT | Freq: Two times a day (BID) | CUTANEOUS | Status: DC

## 2013-01-18 MED ORDER — DOXYCYCLINE HYCLATE 100 MG PO TABS: 100.0000 mg | ORAL_TABLET | Freq: Two times a day (BID) | ORAL | Status: DC

## 2013-01-18 MED ORDER — PROGESTERONE MICRONIZED 100 MG PO CAPS: 100.0000 mg | ORAL_CAPSULE | Freq: Every day | ORAL | Status: DC

## 2013-01-18 NOTE — Telephone Encounter (Signed)
Refill request

## 2013-01-18 NOTE — Patient Instructions (Addendum)
See me in 2 weeks or sooner if not better

## 2013-01-18 NOTE — Progress Notes (Signed)
Subjective:    Patient ID: Megan Combs, female    DOB: 1961/05/29, 52 y.o.   MRN: [id number]  HPI  [name] is here for acute visit.   3 days ago noted "sore area"  Near buttocks.  Found while in shower  And pt describes lesion "broke open and bled"   Spouse though there was a blackened necrotic area.  She denies feeling an insect bite or exposure to tick..  No recent hiking    Has been itching since Friday No purulent drainage   Allergies  Allergen Reactions  . Dulcolax (Bisacodyl)     Dehydration   . Penicillins Hives  . Tape Dermatitis  . Zithromax (Azithromycin) Rash   Past Medical History  Diagnosis Date  . Depression   . Arthritis     right knee  . Fibromyalgia   . Degenerative disc disease   . Fibromyalgia    Past Surgical History  Procedure Laterality Date  . Knee arthroscopy    . Tonsillectomy    . Laproscopy    . Knee arthroscopy     History   Social History  . Marital Status: Married    Spouse Name: N/A    Number of Children: N/A  . Years of Education: N/A   Occupational History  . RN    Social History Main Topics  . Smoking status: Never Smoker   . Smokeless tobacco: Never Used  . Alcohol Use: No  . Drug Use: No  . Sexually Active: Yes   Other Topics Concern  . Not on file   Social History Narrative  . No narrative on file   Family History  Problem Relation Age of Onset  . Colon cancer Father   . Esophageal cancer Father   . Cancer Father   . Stroke Father   . Heart disease Father   . Hypertension Father   . Hyperlipidemia Father   . Heart disease Mother   . Diabetes Mother   . Arthritis Mother   . Hypertension Sister   . Diabetes Maternal Grandfather    Patient Active Problem List  Diagnosis  . Family history of malignant neoplasm  . Personal history of colonic polyps  . Fibromyalgia  . History of anemia  . Depression  . Menopause  . Menopausal hot flushes  . DJD (degenerative joint disease)  . Other and unspecified  hyperlipidemia  . Vaginal dryness, menopausal  . Urge incontinence of urine  . Anxiety  . Plantar fasciitis  . Articular cartilage disorder of knee  . Family history of colon cancer  . Elevated alkaline phosphatase measurement  . Renal cyst, left   Current Outpatient Prescriptions on File Prior to Visit  Medication Sig Dispense Refill  . b complex vitamins tablet Take 1 tablet by mouth daily.      Marland Kitchen buPROPion (WELLBUTRIN XL) 150 MG 24 hr tablet Take 150 mg by mouth daily.      Marland Kitchen buPROPion (WELLBUTRIN XL) 300 MG 24 hr tablet Take 300 mg by mouth daily.      . busPIRone (BUSPAR) 10 MG tablet Take 10 mg by mouth 2 (two) times daily before a meal.      . Calcium Carbonate-Vitamin D (CALCIUM 500 + D) 500-125 MG-UNIT TABS Take 1 tablet by mouth daily.      . diclofenac (VOLTAREN) 75 MG EC tablet Take 1 tablet (75 mg total) by mouth 2 (two) times daily as needed. Take with food  60 tablet  1  .  diclofenac sodium (VOLTAREN) 1 % GEL aaa tid-qid  100 g  2  . Estradiol 0.52 MG/0.87 GM (0.06%) GEL Use 2 pumps on are twice a week.  Give 2 bottles  2 Bottle  1  . ferrous sulfate 325 (65 FE) MG tablet Take 325 mg by mouth daily with breakfast.      . fish oil-omega-3 fatty acids 1000 MG capsule Take 1 g by mouth daily.      Marland Kitchen glucosamine-chondroitin 500-400 MG tablet Take 3 tablets by mouth daily.      . Magnesium 250 MG TABS Take 500 mg by mouth daily.      . Multiple Vitamin (MULITIVITAMIN WITH MINERALS) TABS Take 1 tablet by mouth daily.      . nitroGLYCERIN (NITRODUR - DOSED IN MG/24 HR) 0.2 mg/hr Use 1/4 patch daily  30 patch  2  . progesterone (PROMETRIUM) 100 MG capsule Take 1 capsule (100 mg total) by mouth daily.  30 capsule  2   No current facility-administered medications on file prior to visit.      Review of Systems See HPI    Objective:   Physical Exam  Physical Exam  Nursing note and vitals reviewed.  Constitutional: She is oriented to person, place, and time. She appears  well-developed and well-nourished.  HENT:  Head: Normocephalic and atraumatic.  Cardiovascular: Normal rate and regular rhythm. Exam reveals no gallop and no friction rub.  No murmur heard.  Pulmonary/Chest: Breath sounds normal. She has no wheezes. She has no rales.  Neurological: She is alert and oriented to person, place, and time.  Skin: Skin is warm and dry.   Coccygeal area  Has reddened area with what appers to be a blister.  There is no drainage and no necrotic features.   Psychiatric: She has a normal mood and affect. Her behavior is normal.                 Assessment & Plan:  Skin lesion .  Will give Mycolog creme and 10 day course of doxycycline.  No drainage so cannot get C and S     See me in two weeks or sooner if not better

## 2013-02-01 ENCOUNTER — Ambulatory Visit: Payer: BC Managed Care – PPO | Admitting: Internal Medicine

## 2013-02-07 ENCOUNTER — Emergency Department (HOSPITAL_BASED_OUTPATIENT_CLINIC_OR_DEPARTMENT_OTHER)
Admission: EM | Admit: 2013-02-07 | Discharge: 2013-02-07 | Disposition: A | Discharge status: Home / Self Care | Payer: BC Managed Care – PPO | Attending: Emergency Medicine | Admitting: Emergency Medicine

## 2013-02-07 ENCOUNTER — Encounter (HOSPITAL_BASED_OUTPATIENT_CLINIC_OR_DEPARTMENT_OTHER): Payer: Self-pay

## 2013-02-07 DIAGNOSIS — Y939 Activity, unspecified: Secondary | ICD-10-CM | POA: Insufficient documentation

## 2013-02-07 DIAGNOSIS — F329 Major depressive disorder, single episode, unspecified: Secondary | ICD-10-CM | POA: Insufficient documentation

## 2013-02-07 DIAGNOSIS — Z23 Encounter for immunization: Secondary | ICD-10-CM | POA: Insufficient documentation

## 2013-02-07 DIAGNOSIS — Z8739 Personal history of other diseases of the musculoskeletal system and connective tissue: Secondary | ICD-10-CM | POA: Insufficient documentation

## 2013-02-07 DIAGNOSIS — S51809A Unspecified open wound of unspecified forearm, initial encounter: Secondary | ICD-10-CM | POA: Insufficient documentation

## 2013-02-07 DIAGNOSIS — W540XXA Bitten by dog, initial encounter: Secondary | ICD-10-CM | POA: Insufficient documentation

## 2013-02-07 DIAGNOSIS — Y929 Unspecified place or not applicable: Secondary | ICD-10-CM | POA: Insufficient documentation

## 2013-02-07 DIAGNOSIS — F3289 Other specified depressive episodes: Secondary | ICD-10-CM | POA: Insufficient documentation

## 2013-02-07 DIAGNOSIS — Z79899 Other long term (current) drug therapy: Secondary | ICD-10-CM | POA: Insufficient documentation

## 2013-02-07 MED ORDER — TETANUS-DIPHTH-ACELL PERTUSSIS 5-2.5-18.5 LF-MCG/0.5 IM SUSP
0.5000 mL | Freq: Once | INTRAMUSCULAR | Status: AC
  Administered 2013-02-07: 0.5 mL via INTRAMUSCULAR
  Filled 2013-02-07: qty 0.5

## 2013-02-07 MED ORDER — CLINDAMYCIN HCL 150 MG PO CAPS: 300.0000 mg | ORAL_CAPSULE | Freq: Three times a day (TID) | ORAL | Status: DC

## 2013-02-07 NOTE — ED Provider Notes (Signed)
Medical screening examination/treatment/procedure(s) were performed by non-physician practitioner and as supervising physician I was immediately available for consultation/collaboration.   Gwyneth Sprout, MD 02/07/13 1143

## 2013-02-07 NOTE — ED Provider Notes (Signed)
History     CSN: [id number]  Arrival date & time 02/07/13  1013   First MD Initiated Contact with Patient 02/07/13 1014      Chief Complaint  Patient presents with  . Animal Bite    (Consider location/radiation/quality/duration/timing/severity/associated sxs/prior treatment) HPI Comments: Patient is a 52 year old female who presents with a dog bite that occurred prior to arrival. Patient was bit by her own dog which is a new puppy. The patient has had the dog for 3 weeks. It is up to date on all shots except rabies. The dog has no signs or symptoms of rabies according to the patient. The patient reports 2 puncture wounds to her left forearm. She reports throbbing pain and some swelling. She reports initial bleeding which is now controlled. She cleaned the wounds with hydrogen peroxide prior to coming to the ED. Palpation of the area makes the pain worse. No alleviating factors. No other associated symptoms. No other injuries.   Patient is a 52 y.o. female presenting with animal bite.  Animal Bite     Past Medical History  Diagnosis Date  . Depression   . Arthritis     right knee  . Fibromyalgia   . Degenerative disc disease   . Fibromyalgia     Past Surgical History  Procedure Laterality Date  . Knee arthroscopy    . Tonsillectomy    . Laproscopy    . Knee arthroscopy      Family History  Problem Relation Age of Onset  . Colon cancer Father   . Esophageal cancer Father   . Cancer Father   . Stroke Father   . Heart disease Father   . Hypertension Father   . Hyperlipidemia Father   . Heart disease Mother   . Diabetes Mother   . Arthritis Mother   . Hypertension Sister   . Diabetes Maternal Grandfather     History  Substance Use Topics  . Smoking status: Never Smoker   . Smokeless tobacco: Never Used  . Alcohol Use: No    OB History   Grav Para Term Preterm Abortions TAB SAB Ect Mult Living   2    1   1   0      Review of Systems  Skin: Positive for  wound.  All other systems reviewed and are negative.    Allergies  Dulcolax; Penicillins; Tape; and Zithromax  Home Medications   Current Outpatient Rx  Name  Route  Sig  Dispense  Refill  . b complex vitamins tablet   Oral   Take 1 tablet by mouth daily.         Marland Kitchen buPROPion (WELLBUTRIN XL) 150 MG 24 hr tablet   Oral   Take 150 mg by mouth daily.         Marland Kitchen buPROPion (WELLBUTRIN XL) 300 MG 24 hr tablet   Oral   Take 300 mg by mouth daily.         . busPIRone (BUSPAR) 10 MG tablet   Oral   Take 10 mg by mouth 2 (two) times daily before a meal.         . Calcium Carbonate-Vitamin D (CALCIUM 500 + D) 500-125 MG-UNIT TABS   Oral   Take 1 tablet by mouth daily.         . diclofenac (VOLTAREN) 75 MG EC tablet   Oral   Take 1 tablet (75 mg total) by mouth 2 (two) times daily as needed.  Take with food   60 tablet   1   . diclofenac sodium (VOLTAREN) 1 % GEL      aaa tid-qid   100 g   2   . doxycycline (VIBRA-TABS) 100 MG tablet   Oral   Take 1 tablet (100 mg total) by mouth 2 (two) times daily.   20 tablet   0   . Estradiol 0.52 MG/0.87 GM (0.06%) GEL      Use 2 pumps on are twice a week.  Give 2 bottles   2 Bottle   1   . ferrous sulfate 325 (65 FE) MG tablet   Oral   Take 325 mg by mouth daily with breakfast.         . fish oil-omega-3 fatty acids 1000 MG capsule   Oral   Take 1 g by mouth daily.         Marland Kitchen glucosamine-chondroitin 500-400 MG tablet   Oral   Take 3 tablets by mouth daily.         . Magnesium 250 MG TABS   Oral   Take 500 mg by mouth daily.         . Multiple Vitamin (MULITIVITAMIN WITH MINERALS) TABS   Oral   Take 1 tablet by mouth daily.         . nitroGLYCERIN (NITRODUR - DOSED IN MG/24 HR) 0.2 mg/hr      Use 1/4 patch daily   30 patch   2   . nystatin-triamcinolone ointment (MYCOLOG)   Topical   Apply topically 2 (two) times daily.   30 g   1   . progesterone (PROMETRIUM) 100 MG capsule   Oral    Take 1 capsule (100 mg total) by mouth daily.   30 capsule   3     BP 118/96  Pulse 78  Temp(Src) 98.1 F (36.7 C) (Oral)  Resp 20  Ht 5\' 6"  (1.676 m)  Wt 154 lb (69.854 kg)  BMI 24.87 kg/m2  SpO2 100%  Physical Exam  Nursing note and vitals reviewed. Constitutional: She appears well-developed and well-nourished. No distress.  HENT:  Head: Normocephalic and atraumatic.  Eyes: Conjunctivae are normal.  Neck: Normal range of motion.  Cardiovascular: Normal rate and regular rhythm.  Exam reveals no gallop and no friction rub.   No murmur heard. Pulmonary/Chest: Effort normal and breath sounds normal. She has no wheezes. She has no rales. She exhibits no tenderness.  Abdominal: Soft. There is no tenderness.  Musculoskeletal: Normal range of motion.  Neurological: She is alert.  Speech is goal-oriented. Moves limbs without ataxia.   Skin: Skin is warm and dry.  2 puncture wounds to left forearm-1 at dorsal and 1 at volar aspect. Tender to palpation. No active bleeding.   Psychiatric: She has a normal mood and affect. Her behavior is normal.    ED Course  Procedures (including critical care time)  Labs Reviewed - No data to display No results found.   1. Dog bite, initial encounter       MDM  10:35 AM Patient will have wounds cleaned with betadine and saline. Patient will have tetanus vaccine and be discharged with antibiotics. Patient instructed to watch the dog for any worsening or concerning symptoms of rabies. Vitals stable and patient afebrile. Patient will be discharged without further evaluation. Patient instructed to return with worsening or concerning symptoms.        Emilia Beck, PA-C 02/07/13 1107

## 2013-02-07 NOTE — ED Notes (Signed)
Pt states that she was bitten by a puppy which has not been given rabies vaccines.  Pt has two puncture wounds to the L lower forearm.

## 2013-03-17 ENCOUNTER — Ambulatory Visit (INDEPENDENT_AMBULATORY_CARE_PROVIDER_SITE_OTHER): Payer: BC Managed Care – PPO | Admitting: Internal Medicine

## 2013-03-17 ENCOUNTER — Encounter: Payer: Self-pay | Admitting: Internal Medicine

## 2013-03-17 VITALS — BP 112/64 | HR 72 | Temp 97.7°F | Resp 18 | Wt 156.0 lb

## 2013-03-17 DIAGNOSIS — R3989 Other symptoms and signs involving the genitourinary system: Secondary | ICD-10-CM

## 2013-03-17 DIAGNOSIS — R399 Unspecified symptoms and signs involving the genitourinary system: Secondary | ICD-10-CM

## 2013-03-17 DIAGNOSIS — R748 Abnormal levels of other serum enzymes: Secondary | ICD-10-CM

## 2013-03-17 DIAGNOSIS — R922 Inconclusive mammogram: Secondary | ICD-10-CM

## 2013-03-17 DIAGNOSIS — N6459 Other signs and symptoms in breast: Secondary | ICD-10-CM

## 2013-03-17 DIAGNOSIS — N3941 Urge incontinence: Secondary | ICD-10-CM

## 2013-03-17 LAB — POCT URINALYSIS DIPSTICK
Protein, UA: NEGATIVE
Spec Grav, UA: 1.01
Urobilinogen, UA: NEGATIVE

## 2013-03-17 NOTE — Progress Notes (Signed)
Subjective:    Patient ID: Megan Combs, female    DOB: 1961/08/19, 52 y.o.   MRN: [id number]  HPI  Megan Combs is here for acute visit.  She has been having some urinary urgency and frequency.  She has known urge incontinence, has seen a pelvic floor PT .  No flank pain or fever.  She does not wish any meds for this.    She has healed from her dog bite.  Did not take rabies vaccine but vet quarantined dog and showed no signs of rabies  See mm  She has category 3 breast density  Allergies  Allergen Reactions  . Dulcolax (Bisacodyl)     Dehydration   . Penicillins Hives  . Tape Dermatitis  . Zithromax (Azithromycin) Rash   Past Medical History  Diagnosis Date  . Depression   . Arthritis     right knee  . Fibromyalgia   . Degenerative disc disease   . Fibromyalgia    Past Surgical History  Procedure Laterality Date  . Knee arthroscopy    . Tonsillectomy    . Laproscopy    . Knee arthroscopy     History   Social History  . Marital Status: Married    Spouse Name: N/A    Number of Children: N/A  . Years of Education: N/A   Occupational History  . RN    Social History Main Topics  . Smoking status: Never Smoker   . Smokeless tobacco: Never Used  . Alcohol Use: No  . Drug Use: No  . Sexually Active: Yes   Other Topics Concern  . Not on file   Social History Narrative  . No narrative on file   Family History  Problem Relation Age of Onset  . Colon cancer Father   . Esophageal cancer Father   . Cancer Father   . Stroke Father   . Heart disease Father   . Hypertension Father   . Hyperlipidemia Father   . Heart disease Mother   . Diabetes Mother   . Arthritis Mother   . Hypertension Sister   . Diabetes Maternal Grandfather    Patient Active Problem List   Diagnosis Date Noted  . Renal cyst, left 01/06/2013  . Family history of colon cancer 01/03/2013  . Elevated alkaline phosphatase measurement 01/03/2013  . Articular cartilage disorder of knee  12/24/2012  . Plantar fasciitis 07/21/2012  . Anxiety 07/02/2012  . History of anemia 07/01/2012  . Depression 07/01/2012  . Menopause 07/01/2012  . Menopausal hot flushes 07/01/2012  . DJD (degenerative joint disease) 07/01/2012  . Other and unspecified hyperlipidemia 07/01/2012  . Vaginal dryness, menopausal 07/01/2012  . Urge incontinence of urine 07/01/2012  . Fibromyalgia   . Family history of malignant neoplasm 12/25/2011  . Personal history of colonic polyps 12/25/2011   Current Outpatient Prescriptions on File Prior to Visit  Medication Sig Dispense Refill  . b complex vitamins tablet Take 1 tablet by mouth daily.      Marland Kitchen buPROPion (WELLBUTRIN XL) 150 MG 24 hr tablet Take 150 mg by mouth daily.      Marland Kitchen buPROPion (WELLBUTRIN XL) 300 MG 24 hr tablet Take 300 mg by mouth daily.      . diclofenac (VOLTAREN) 75 MG EC tablet Take 1 tablet (75 mg total) by mouth 2 (two) times daily as needed. Take with food  60 tablet  1  . diclofenac sodium (VOLTAREN) 1 % GEL aaa tid-qid  100 g  2  .  Estradiol 0.52 MG/0.87 GM (0.06%) GEL Use 2 pumps on are twice a week.  Give 2 bottles  2 Bottle  1  . ferrous sulfate 325 (65 FE) MG tablet Take 325 mg by mouth daily with breakfast.      . fish oil-omega-3 fatty acids 1000 MG capsule Take 1 g by mouth daily.      Marland Kitchen glucosamine-chondroitin 500-400 MG tablet Take 3 tablets by mouth daily.      . Magnesium 250 MG TABS Take 500 mg by mouth daily.      . Multiple Vitamin (MULITIVITAMIN WITH MINERALS) TABS Take 1 tablet by mouth daily.      . progesterone (PROMETRIUM) 100 MG capsule Take 1 capsule (100 mg total) by mouth daily.  30 capsule  3  . Calcium Carbonate-Vitamin D (CALCIUM 500 + D) 500-125 MG-UNIT TABS Take 1 tablet by mouth daily.      Marland Kitchen nystatin-triamcinolone ointment (MYCOLOG) Apply topically 2 (two) times daily.  30 g  1   No current facility-administered medications on file prior to visit.      Review of Systems See HPI    Objective:    Physical Exam Physical Exam  Nursing note and vitals reviewed.  Constitutional: She is oriented to person, place, and time. She appears well-developed and well-nourished.  HENT:  Head: Normocephalic and atraumatic.  Cardiovascular: Normal rate and regular rhythm. Exam reveals no gallop and no friction rub.  No murmur heard.  Pulmonary/Chest: Breath sounds normal. She has no wheezes. She has no rales.  Neurological: She is alert and oriented to person, place, and time. ABD:  Soft NT/ND NO CVA tenderness  Skin: Skin is warm and dry.  Psychiatric: She has a normal mood and affect. Her behavior is normal.              Assessment & Plan:  Urge incontinence  U/A today normal.  I advised if symptoms continue we can check another urine in a few days.  Advised a trial of Oxytrol or ditropan but pt declines  Category 3 breast density  She has no FH of breast cancer  Advised to go to breast density website and advised for 3-D MM next year.  Minimal elevated  Alk phos.  Liver ultrasound is normal  Will follow for now

## 2013-05-14 ENCOUNTER — Ambulatory Visit (INDEPENDENT_AMBULATORY_CARE_PROVIDER_SITE_OTHER): Payer: BC Managed Care – PPO | Admitting: Gynecology

## 2013-05-14 ENCOUNTER — Encounter: Payer: Self-pay | Admitting: Gynecology

## 2013-05-14 VITALS — BP 122/62 | HR 66 | Resp 14 | Ht 66.0 in | Wt 148.2 lb

## 2013-05-14 DIAGNOSIS — Z0000 Encounter for general adult medical examination without abnormal findings: Secondary | ICD-10-CM

## 2013-05-14 DIAGNOSIS — Z01419 Encounter for gynecological examination (general) (routine) without abnormal findings: Secondary | ICD-10-CM

## 2013-05-14 DIAGNOSIS — R3129 Other microscopic hematuria: Secondary | ICD-10-CM

## 2013-05-14 DIAGNOSIS — N951 Menopausal and female climacteric states: Secondary | ICD-10-CM

## 2013-05-14 DIAGNOSIS — Z124 Encounter for screening for malignant neoplasm of cervix: Secondary | ICD-10-CM

## 2013-05-14 LAB — POCT URINALYSIS DIPSTICK
Leukocytes, UA: NEGATIVE
Spec Grav, UA: 1.02
pH, UA: 5.5

## 2013-05-14 MED ORDER — LORAZEPAM 0.5 MG PO TABS: ORAL_TABLET | ORAL | Status: DC

## 2013-05-14 NOTE — Progress Notes (Signed)
52 y.o. married Caucasian female   G2P0010 here for annual exam. Pt reports no menses since 2010.  She does report hot flashes, does have night sweats, does have vaginal dryness.  She is using lubricants.   She does not report post-menopasual bleeding.  Pt took HRT when first perimenopausal but didn't like how she felt, but hot flashes started, started cymbalta but d/c'd due to anorgasmia, orgasms have returned.  She is on Wellbutrin.  Pt states that she still feels that her sleep is interrupted with the night sweats and that that is causing her to feel foggy during the day and it is affecting her work.  Pt was started on adderal which helps with focus but is affecting sleep. Pt was interested in other option to help her sleep better.  She has approx 2 night sweats during the night that cause her to wake up.  Patient's last menstrual period was 10/28/2008.          Sexually active: yes  The current method of family planning is post menopausal status.    Exercising: yes  walking and yoga Last pap:  10/2011  Negative  (HPV not ordered)  Abnormal PAP: n/a Mammogram:  11/2012  B1 RAD 1 BSE: yes Colonoscopy:  2012- polyps detected.  DEXA:  2011 Alcohol:  no Tobacco:  no  Health Maintenance  Topic Date Due  . Influenza Vaccine  06/28/2013  . Pap Smear  11/11/2014  . Mammogram  12/24/2014  . Colonoscopy  11/21/2016  . Tetanus/tdap  02/08/2023    Family History  Problem Relation Age of Onset  . Colon cancer Father   . Esophageal cancer Father   . Cancer Father   . Stroke Father   . Heart disease Father   . Hypertension Father   . Hyperlipidemia Father   . Heart disease Mother   . Diabetes Mother   . Arthritis Mother   . Hypertension Sister   . Diabetes Maternal Grandfather     Patient Active Problem List   Diagnosis Date Noted  . Breast density 03/17/2013  . Renal cyst, left 01/06/2013  . Family history of colon cancer 01/03/2013  . Elevated alkaline phosphatase measurement  01/03/2013  . Articular cartilage disorder of knee 12/24/2012  . Plantar fasciitis 07/21/2012  . Anxiety 07/02/2012  . History of anemia 07/01/2012  . Depression 07/01/2012  . Menopause 07/01/2012  . Menopausal hot flushes 07/01/2012  . DJD (degenerative joint disease) 07/01/2012  . Other and unspecified hyperlipidemia 07/01/2012  . Vaginal dryness, menopausal 07/01/2012  . Urge incontinence of urine 07/01/2012  . Fibromyalgia   . Family history of malignant neoplasm 12/25/2011  . Personal history of colonic polyps 12/25/2011    Past Medical History  Diagnosis Date  . Depression   . Arthritis     right knee  . Fibromyalgia   . Degenerative disc disease   . Fibromyalgia   . Plantar fasciitis of left foot     Past Surgical History  Procedure Laterality Date  . Knee arthroscopy    . Tonsillectomy    . Laproscopy    . Knee arthroscopy      Allergies: Dulcolax; Penicillins; Tape; and Zithromax  Current Outpatient Prescriptions  Medication Sig Dispense Refill  . amphetamine-dextroamphetamine (ADDERALL) 10 MG tablet Take 10 mg by mouth 2 (two) times daily.      Marland Kitchen b complex vitamins tablet Take 1 tablet by mouth daily.      Marland Kitchen buPROPion (WELLBUTRIN XL) 150 MG 24  hr tablet Take 150 mg by mouth daily.      Marland Kitchen buPROPion (WELLBUTRIN XL) 300 MG 24 hr tablet Take 300 mg by mouth daily.      . Calcium Carbonate-Vitamin D (CALCIUM 500 + D) 500-125 MG-UNIT TABS Take 1 tablet by mouth daily.      . Estradiol 0.52 MG/0.87 GM (0.06%) GEL Use 2 pumps on are twice a week.  Give 2 bottles  2 Bottle  1  . ferrous sulfate 325 (65 FE) MG tablet Take 325 mg by mouth daily with breakfast.      . fish oil-omega-3 fatty acids 1000 MG capsule Take 1 g by mouth daily.      Marland Kitchen glucosamine-chondroitin 500-400 MG tablet Take 3 tablets by mouth daily.      . Magnesium 250 MG TABS Take 500 mg by mouth daily.      . Multiple Vitamin (MULITIVITAMIN WITH MINERALS) TABS Take 1 tablet by mouth daily.      .  progesterone (PROMETRIUM) 100 MG capsule Take 1 capsule (100 mg total) by mouth daily.  30 capsule  3   No current facility-administered medications for this visit.    ROS: Pertinent items are noted in HPI.  Exam:    BP 122/62  Pulse 66  Resp 14  Ht 5\' 6"  (1.676 m)  Wt 148 lb 3.2 oz (67.223 kg)  BMI 23.93 kg/m2  LMP 10/28/2008 Weight change: @WEIGHTCHANGE @ Last 3 height recordings:  Ht Readings from Last 3 Encounters:  05/14/13 5\' 6"  (1.676 m)  02/07/13 5\' 6"  (1.676 m)  12/21/12 5' 6.5" (1.689 m)   General appearance: alert, cooperative and appears stated age Head: Normocephalic, without obvious abnormality, atraumatic Neck: no adenopathy, no carotid bruit, no JVD, supple, symmetrical, trachea midline and thyroid not enlarged, symmetric, no tenderness/mass/nodules Lungs: clear to auscultation bilaterally Breasts: normal appearance, no masses or tenderness Heart: regular rate and rhythm, S1, S2 normal, no murmur, click, rub or gallop Abdomen: soft, non-tender; bowel sounds normal; no masses,  no organomegaly Extremities: extremities normal, atraumatic, no cyanosis or edema Skin: Skin color, texture, turgor normal. No rashes or lesions Lymph nodes: Cervical, supraclavicular, and axillary nodes normal. no inguinal nodes palpated Neurologic: Grossly normal   Pelvic: External genitalia:  no lesions              Urethra: normal appearing urethra with no masses, tenderness or lesions              Bartholins and Skenes: normal                 Vagina: normal appearing vagina with normal color and discharge, no lesions              Cervix: normal appearance              Pap taken: yes        Bimanual Exam:  Uterus:  uterus is normal size, shape, consistency and nontender                                      Adnexa:    tenderness right                                      Rectovaginal: Confirms  Anus:  normal sphincter tone, no lesions  A: well  woman no contraindication to continue hormonal therapy, menopausal symptoms Contraceptive management     P: We had a long discussion regarding treatment of night sweats, while they have gotten better they still seem bothersome.  Since she has had a negative affect from SNRI, she is reluctant to try as first line.  We discussed either low dose ativan whci she can trial on weekend or bellergal which seems too strong for there symptoms.  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 Discussed new PAP guidelines, PAP done today Will continue on current HRT, to call for vaginal bleeding mammogram pap smear Microscopic hematuria noted after sex, pt to recollect u/a either here or at PCP return annually or prn Discussed PAP guideline changes, importance of weight bearing exercises, calcium, vit D and balanced diet.  An After Visit Summary was printed and given to the patient. Additional 49m spent discussing menopausal symptoms

## 2013-05-14 NOTE — Patient Instructions (Signed)

## 2013-05-17 ENCOUNTER — Telehealth: Payer: Self-pay | Admitting: *Deleted

## 2013-05-17 DIAGNOSIS — Z7689 Persons encountering health services in other specified circumstances: Secondary | ICD-10-CM

## 2013-05-17 NOTE — Telephone Encounter (Signed)
Megan Combs would like to be referred for sleep apnea testing.

## 2013-05-24 ENCOUNTER — Other Ambulatory Visit: Payer: Self-pay | Admitting: *Deleted

## 2013-05-24 NOTE — Telephone Encounter (Signed)
Refill request

## 2013-05-25 ENCOUNTER — Other Ambulatory Visit: Payer: Self-pay | Admitting: *Deleted

## 2013-05-25 MED ORDER — PROGESTERONE MICRONIZED 100 MG PO CAPS: 100.0000 mg | ORAL_CAPSULE | Freq: Every day | ORAL | Status: DC

## 2013-05-25 NOTE — Telephone Encounter (Signed)
See Heather's note refill request

## 2013-05-25 NOTE — Telephone Encounter (Signed)
Megan Combs  ASk pt if she is still seeing her psychiatrist Dr. Toni Arthurs.  This is a high dose and I would prefer her psychiatrist prescribe this for her.  I usually do not go above 300 mg of Wellbutrin.

## 2013-05-25 NOTE — Telephone Encounter (Signed)
Megan Combs needs a Rx called in to [facility] on [address].  She was previously prescribed Wellbutrin XL 450 mg by her previous doctor, and she is now needing a new Rx.  She said that it is 450 mg daily and her Rx is for Wellbutrin XL 300 mg tabs and Wellbutrin XL 150 mg tabs to total the 450 mg. If you have any questions please call her at home [phone].

## 2013-05-25 NOTE — Telephone Encounter (Signed)
She will make an appt to see Dr Rebecca Eaton her previous PCP is who originally prescribed. Requesting 30 day supply until she can get an appt

## 2013-05-26 MED ORDER — BUPROPION HCL ER (XL) 300 MG PO TB24: 300.0000 mg | ORAL_TABLET | Freq: Every day | ORAL | Status: DC

## 2013-05-26 MED ORDER — BUPROPION HCL ER (XL) 150 MG PO TB24: 150.0000 mg | ORAL_TABLET | Freq: Every day | ORAL | Status: DC

## 2013-06-08 ENCOUNTER — Institutional Professional Consult (permissible substitution): Payer: BC Managed Care – PPO | Admitting: Pulmonary Disease

## 2013-06-21 ENCOUNTER — Ambulatory Visit: Payer: BC Managed Care – PPO | Admitting: Gynecology

## 2013-06-21 ENCOUNTER — Encounter: Payer: Self-pay | Admitting: Gynecology

## 2013-06-21 ENCOUNTER — Ambulatory Visit (INDEPENDENT_AMBULATORY_CARE_PROVIDER_SITE_OTHER): Payer: BC Managed Care – PPO | Admitting: Gynecology

## 2013-06-21 VITALS — BP 120/60 | HR 80 | Resp 12 | Ht 66.0 in | Wt 150.0 lb

## 2013-06-21 DIAGNOSIS — N951 Menopausal and female climacteric states: Secondary | ICD-10-CM

## 2013-06-21 MED ORDER — ESTRADIOL 1 MG PO TABS: 1.0000 mg | ORAL_TABLET | Freq: Every day | ORAL | Status: DC

## 2013-06-21 NOTE — Progress Notes (Signed)
Pt here for follow up.  We had placed her on ativan to see if better sleep would help her symptoms.  Unfortunately she felt hung over after taking.  Pt had been seen by Dr Mayford Knife, for a sleep study although she does not have sleep apnea she does snore.  Pt reports feeling very hot at night, she is waking up several times during the night very hot.  Pt's husband also has sleep apnea.  Pt is currently weaning off adderall- she is on 5mg /d down form 10mg  BID, feels that she is doing ok.  Did 10mg  once a day for 3d.   Pt had been on vivelle dot but was having itching under the patch. We offered her either an oral preparation of estrogen, vs bellergal vs patch with topical steroid. Pt will finish her estrogel, increased to three 43m ago, will increase to 4 and when done will start estrogen at 1/2 tablet and will call with effects. We will assess her in 73m, pt instructed she may need a higher dose of progestin if she is on a higher dose of estrogen, reminded to call for any bleeding. 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

## 2013-07-13 ENCOUNTER — Ambulatory Visit (HOSPITAL_BASED_OUTPATIENT_CLINIC_OR_DEPARTMENT_OTHER)
Admission: RE | Admit: 2013-07-13 | Discharge: 2013-07-13 | Disposition: A | Discharge status: Home / Self Care | Payer: BC Managed Care – PPO | Source: Ambulatory Visit | Attending: Internal Medicine | Admitting: Internal Medicine

## 2013-07-13 ENCOUNTER — Encounter: Payer: Self-pay | Admitting: Internal Medicine

## 2013-07-13 ENCOUNTER — Ambulatory Visit (INDEPENDENT_AMBULATORY_CARE_PROVIDER_SITE_OTHER): Payer: BC Managed Care – PPO | Admitting: Internal Medicine

## 2013-07-13 VITALS — BP 107/66 | HR 82 | Temp 99.2°F | Resp 18 | Wt 150.0 lb

## 2013-07-13 DIAGNOSIS — M79609 Pain in unspecified limb: Secondary | ICD-10-CM

## 2013-07-13 DIAGNOSIS — M79661 Pain in right lower leg: Secondary | ICD-10-CM

## 2013-07-13 DIAGNOSIS — M79604 Pain in right leg: Secondary | ICD-10-CM

## 2013-07-13 DIAGNOSIS — R252 Cramp and spasm: Secondary | ICD-10-CM | POA: Insufficient documentation

## 2013-07-13 NOTE — Progress Notes (Signed)
Subjective:    Patient ID: Megan Combs, female    DOB: [birth year], 52 y.o.   MRN: [id number]  HPI Acute visit  3 weeks of crampy, soreness R calf  No edema. Has been exercising and doing yoga .  She did see Dr. Farrel Gobble  GYN 3 weeks ago who started pt on oral estradiol 1 mg which she has been taking for about 3 weeks.  She has stopped her estradiol gel.   Allergies  Allergen Reactions  . Dulcolax [Bisacodyl]     Dehydration   . Penicillins Hives  . Tape Dermatitis  . Zithromax [Azithromycin] Rash   Past Medical History  Diagnosis Date  . Depression   . Arthritis     right knee  . Fibromyalgia   . Degenerative disc disease   . Fibromyalgia   . Plantar fasciitis of left foot    Past Surgical History  Procedure Laterality Date  . Knee arthroscopy    . Tonsillectomy    . Laproscopy    . Knee arthroscopy     History   Social History  . Marital Status: Married    Spouse Name: N/A    Number of Children: N/A  . Years of Education: N/A   Occupational History  . RN    Social History Main Topics  . Smoking status: Never Smoker   . Smokeless tobacco: Never Used  . Alcohol Use: No  . Drug Use: No  . Sexual Activity: Yes    Birth Control/ Protection: Post-menopausal   Other Topics Concern  . Not on file   Social History Narrative  . No narrative on file   Family History  Problem Relation Age of Onset  . Colon cancer Father   . Esophageal cancer Father   . Cancer Father   . Stroke Father   . Heart disease Father   . Hypertension Father   . Hyperlipidemia Father   . Heart disease Mother   . Diabetes Mother   . Arthritis Mother   . Hypertension Sister   . Diabetes Maternal Grandfather    Patient Active Problem List   Diagnosis Date Noted  . Breast density 03/17/2013  . Renal cyst, left 01/06/2013  . Family history of colon cancer 01/03/2013  . Elevated alkaline phosphatase measurement 01/03/2013  . Articular cartilage disorder of knee 12/24/2012  .  Plantar fasciitis 07/21/2012  . Anxiety 07/02/2012  . History of anemia 07/01/2012  . Depression 07/01/2012  . Menopause 07/01/2012  . Menopausal hot flushes 07/01/2012  . DJD (degenerative joint disease) 07/01/2012  . Other and unspecified hyperlipidemia 07/01/2012  . Vaginal dryness, menopausal 07/01/2012  . Urge incontinence of urine 07/01/2012  . Fibromyalgia   . Family history of malignant neoplasm 12/25/2011  . Personal history of colonic polyps 12/25/2011   Current Outpatient Prescriptions on File Prior to Visit  Medication Sig Dispense Refill  . b complex vitamins tablet Take 1 tablet by mouth daily.      Marland Kitchen buPROPion (WELLBUTRIN XL) 150 MG 24 hr tablet Take 1 tablet (150 mg total) by mouth daily.  30 tablet  1  . buPROPion (WELLBUTRIN XL) 300 MG 24 hr tablet Take 1 tablet (300 mg total) by mouth daily.  30 tablet  1  . Calcium Carbonate-Vitamin D (CALCIUM 500 + D) 500-125 MG-UNIT TABS Take 1 tablet by mouth daily.      Marland Kitchen estradiol (ESTRACE) 1 MG tablet Take 1 tablet (1 mg total) by mouth daily.  30 tablet  0  . ferrous sulfate 325 (65 FE) MG tablet Take 325 mg by mouth daily with breakfast.      . fish oil-omega-3 fatty acids 1000 MG capsule Take 1 g by mouth daily.      Marland Kitchen glucosamine-chondroitin 500-400 MG tablet Take 3 tablets by mouth daily.      . Magnesium 250 MG TABS Take 500 mg by mouth daily.      . Multiple Vitamin (MULITIVITAMIN WITH MINERALS) TABS Take 1 tablet by mouth daily.      . progesterone (PROMETRIUM) 100 MG capsule Take 1 capsule (100 mg total) by mouth daily.  30 capsule  3  . amphetamine-dextroamphetamine (ADDERALL) 10 MG tablet Take 10 mg by mouth 2 (two) times daily.      . Estradiol 0.52 MG/0.87 GM (0.06%) GEL Use 2 pumps on are twice a week.  Give 2 bottles  2 Bottle  1  . LORazepam (ATIVAN) 0.5 MG tablet 1/2 to 1 tablet at night as needed for sleep  30 tablet  0   No current facility-administered medications on file prior to visit.      Review of  Systems See HPI    Objective:   Physical Exam Physical Exam  Nursing note and vitals reviewed.  Constitutional: She is oriented to person, place, and time. She appears well-developed and well-nourished.  HENT:  Head: Normocephalic and atraumatic.  Cardiovascular: Normal rate and regular rhythm. Exam reveals no gallop and no friction rub.  No murmur heard.  Pulmonary/Chest: Breath sounds normal. She has no wheezes. She has no rales.  Neurological: She is alert and oriented to person, place, and time.  RLE  homan's sign negative    NO venous chords palpable I see no edema in either leg Skin: Skin is warm and dry.  Psychiatric: She has a normal mood and affect. Her behavior is normal.              Assessment & Plan:  Calf pain  Will get venous doppler    Further management based on results.

## 2013-07-26 ENCOUNTER — Telehealth: Payer: Self-pay | Admitting: *Deleted

## 2013-07-26 ENCOUNTER — Telehealth: Payer: Self-pay | Admitting: Gynecology

## 2013-07-26 NOTE — Telephone Encounter (Signed)
Patient is calling to schedule a consult per lathrop about the estrogen she was put on.

## 2013-07-26 NOTE — Telephone Encounter (Signed)
Consult appointment scheduled.  Patient agreeable to time and date.

## 2013-07-26 NOTE — Telephone Encounter (Signed)
Megan Combs called and left a message on Friday stating that she got confused on where [facility] was.  She went to [facility] instead.

## 2013-07-27 ENCOUNTER — Telehealth: Payer: Self-pay | Admitting: Gynecology

## 2013-07-27 NOTE — Telephone Encounter (Signed)
lmtcb

## 2013-07-27 NOTE — Telephone Encounter (Signed)
Patient is asking for a refill of Estradiol. [facility]@ [address] [phone]

## 2013-07-27 NOTE — Telephone Encounter (Signed)
Per chart dr lathrop wrote #30 pills with 0 refils on 06-21-13 but patient was suppose to only take 1/2 tab. Pt is also coming in on 10/14 for consult of med. Pt shouldn't need any more until appt

## 2013-07-30 ENCOUNTER — Other Ambulatory Visit: Payer: Self-pay

## 2013-07-30 MED ORDER — ESTRADIOL 1 MG PO TABS: 30.0000 mg | ORAL_TABLET | Freq: Every day | ORAL | Status: DC

## 2013-07-30 NOTE — Telephone Encounter (Signed)
Spoke to patient who has been taking 1 tablet daily rather than 1/2 tablet. Telephone call note taken & documented there & routed to lathrop to advise

## 2013-07-30 NOTE — Telephone Encounter (Signed)
Call Documentation    Araceli Bouche, CMA at 07/27/2013 5:13 PM    Status: Signed             lmtcb        Araceli Bouche, CMA at 07/27/2013 5:13 PM    Status: Signed             Per chart dr lathrop wrote #30 pills with 0 refils on 06-21-13 but patient was suppose to only take 1/2 tab. Pt is also coming in on 10/14 for consult of med. Pt shouldn't need any more until appt        Neta Ehlers at 07/27/2013 11:27 AM    Status: Signed             Patient is asking for a refill of Estradiol. [facility]@ [address] [phone]        Spoke with patient & she states she has been taking estradiol 1mg  tablet daily. Per your note it states that patient was to take 1/2 tablet daily but the rx that was sent said 1mg  tablet daily. Pt states that maybe that's why she hasnt been feeling as great because she has been taking 1 tablet rather than half. Pt has consult appt with you on 08/10/13. Please advise as to what patient should do regarding refill

## 2013-08-04 ENCOUNTER — Other Ambulatory Visit: Payer: Self-pay | Admitting: *Deleted

## 2013-08-04 ENCOUNTER — Ambulatory Visit (INDEPENDENT_AMBULATORY_CARE_PROVIDER_SITE_OTHER): Payer: BC Managed Care – PPO | Admitting: Gynecology

## 2013-08-04 ENCOUNTER — Encounter: Payer: Self-pay | Admitting: Gynecology

## 2013-08-04 VITALS — BP 116/70 | HR 80 | Resp 16 | Ht 66.0 in | Wt 147.0 lb

## 2013-08-04 DIAGNOSIS — Z139 Encounter for screening, unspecified: Secondary | ICD-10-CM

## 2013-08-04 DIAGNOSIS — N951 Menopausal and female climacteric states: Secondary | ICD-10-CM

## 2013-08-04 MED ORDER — VENLAFAXINE HCL ER 37.5 MG PO CP24: 37.5000 mg | ORAL_CAPSULE | Freq: Every day | ORAL | Status: DC

## 2013-08-04 MED ORDER — ESTRADIOL 1 MG PO TABS: 1.0000 mg | ORAL_TABLET | Freq: Every day | ORAL | Status: DC

## 2013-08-04 NOTE — Patient Instructions (Addendum)
Please co-ordinate wellbutrin and effexor with Dr Toni Arthurs, start effexor at 37.5mg  for a minimum of 2w. Then mychart me back if need to increase dose to 75mg .  We will begin weaning estrogen once hot flashes controled and then may need to titrate up as estrogen comes down Continue 1mg /d estrogen until stable Continue prometrium daily

## 2013-08-04 NOTE — Progress Notes (Signed)
Pt here to discuss management of menopausal symptoms.  Pt states that she mistakenly took 1mg  of estrace since last office visit instead of the 1/2pill she had planned on taking.  She states that her hot flashes are improving but will still have night sweats that can wake her up.  She is on wellbutrin 450mg  daily for depression and she sees Dr. Len Blalock on a regular basis, on my request, she had asked him about adding effexor to wellbutrin and he is in agreement.  He does recommend that she decrease the wellbutrin to 300mg  and he will help her with that transition.  We will ask him to regulate the 2 medications. The patient would ultimately like to stop the estrogen entirely.  We have suggested that she continue the estrogen 1mg  and overlap it with the onset of the effexor and once she feels llike she is stabilizing then we can slowly decrease the estrogen in a slow taper.  She is aware that we will start effexor at 37.5mg  and will slowly increase.  She was given instructions in that regard.  In addition she is aware that most symptoms of menopause are treated at doses that are subtherapeutic from a depresssion stand-point and that she may therefore need a higher dose. We would prefer to taper her estrogen with a patch to minimize dose step down.  She has had irritation from the patch in the past, we gave her a placebo to try and recommend that she place a small amount of HC cream underneath to see if she can tolerate it, she was asked to let us know.  She is agreeable. She denies any bleeding on the estrogen/progestin combination, she does report feeling cloudy in the am-we recommend she move the time up that she takes her meds. Refill on estrace 1mg  #30 Rx effexor 37.5mg  given  BP 116/70  Pulse 80  Resp 16  Ht 5\' 6"  (1.676 m)  Wt 147 lb (66.679 kg)  BMI 23.74 kg/m2  LMP 10/28/2008 General appearance: alert, cooperative and appears stated age Neurologic: Grossly normal, alert and oriented  x3.  Length of time spent discussing menopausal symptoms 51m, >50%face to face

## 2013-08-06 LAB — QUANTIFERON TB GOLD ASSAY (BLOOD)
Interferon Gamma Release Assay: NEGATIVE
Mitogen value: >10 IU/mL
Quantiferon Nil Value: 0.04 IU/mL
Quantiferon Tb Ag Minus Nil Value: 0 IU/mL
TB Ag value: 0.04 IU/mL

## 2013-08-09 ENCOUNTER — Institutional Professional Consult (permissible substitution): Payer: Self-pay | Admitting: Gynecology

## 2013-08-16 ENCOUNTER — Institutional Professional Consult (permissible substitution): Payer: Self-pay | Admitting: Gynecology

## 2013-08-17 ENCOUNTER — Encounter: Payer: Self-pay | Admitting: Gynecology

## 2013-08-23 ENCOUNTER — Telehealth: Payer: Self-pay | Admitting: Emergency Medicine

## 2013-08-23 NOTE — Telephone Encounter (Signed)
Patient requested [place] Patch placebo to Dr. Farrel Gobble via email.  Dr. Farrel Gobble requested I call patient to advise that we would mail one out for her.  Message left to return call to [place] at [phone].   Placed placebo patch in the mail for her.

## 2013-08-24 NOTE — Telephone Encounter (Signed)
Patient called and left a message yesterday returning tracy's call.

## 2013-08-24 NOTE — Telephone Encounter (Signed)
Reached pt's VM confirming name that a placebo patch was put in the mail for her yesterday. Pt to call back with any questions.

## 2013-08-25 ENCOUNTER — Encounter: Payer: Self-pay | Admitting: Sports Medicine

## 2013-08-25 ENCOUNTER — Ambulatory Visit (INDEPENDENT_AMBULATORY_CARE_PROVIDER_SITE_OTHER): Payer: BC Managed Care – PPO | Admitting: Sports Medicine

## 2013-08-25 ENCOUNTER — Telehealth: Payer: Self-pay | Admitting: Gynecology

## 2013-08-25 VITALS — BP 124/79 | HR 81 | Ht 66.0 in | Wt 145.0 lb

## 2013-08-25 DIAGNOSIS — M25561 Pain in right knee: Secondary | ICD-10-CM

## 2013-08-25 DIAGNOSIS — M25569 Pain in unspecified knee: Secondary | ICD-10-CM

## 2013-08-25 NOTE — Progress Notes (Signed)
  Subjective:    Patient ID: Megan Combs, female    DOB: August 06, 1961, 52 y.o.   MRN: [id number]  HPI chief complaint: Right knee pain  Megan Combs comes in today complaining of 2 weeks of lateral right knee pain. No trauma. She has gotten really involved in an exercise program and as a result has lost about 15 pounds. Program consists of leg extensions, hamstring curls, rowing, leg presses, and yoga. She feels like the leg extensions may be what caused her symptoms. She describes a sharp sensation along the lateral knee which is not associated with any swelling. She has a well-documented history of prior problems in this knee from an OCD but her current pain is different in nature than what she has experienced previously. No locking or catching. No feelings of instability. She has been using both ice and NSAIDs which have been helpful. She has also been wearing her body helix compression sleeve which she finds to be comfortable.  Interim medical history is unchanged    Review of Systems     Objective:   Physical Exam Well-developed, well-nourished. No acute distress. Awake alert and oriented x3  Right knee: Full range of motion. No effusion. No soft tissue swelling. There is tenderness to palpation along the distal IT band. Neg OBERS. She has hip abduction weakness with resistance. Knee is stable to ligamentous exam. No joint line tenderness. Negative McMurray's. Neurovascularly intact distally. Walking without significant limp.       Assessment & Plan:  Right knee pain secondary to distal IT band syndrome  IT band stretches and hip abductor strengthening exercises. I think the patient should avoid leg extensions for the next couple of weeks. Sample of Pennsaid was provided with instructions to use it twice daily for the next several days. Continue with icing after exercise. Followup if symptoms persist or worsen.

## 2013-08-25 NOTE — Telephone Encounter (Signed)
Spoke with patient. Advised that placebo patch was in the mail. She will try patch and let Dr. Farrel Gobble know.

## 2013-08-25 NOTE — Telephone Encounter (Signed)
Return call to patient, LMTCB.  

## 2013-08-25 NOTE — Telephone Encounter (Signed)
Returning tracys call.

## 2013-08-25 NOTE — Telephone Encounter (Signed)
Patient calling stating she is returning Tracy's call. No open phone notes but two open emails.

## 2013-09-29 ENCOUNTER — Encounter: Payer: Self-pay | Admitting: Internal Medicine

## 2013-09-29 ENCOUNTER — Ambulatory Visit (INDEPENDENT_AMBULATORY_CARE_PROVIDER_SITE_OTHER): Payer: BC Managed Care – PPO | Admitting: Internal Medicine

## 2013-09-29 VITALS — BP 110/70 | HR 78 | Temp 98.2°F | Resp 18 | Wt 151.0 lb

## 2013-09-29 DIAGNOSIS — R0789 Other chest pain: Secondary | ICD-10-CM

## 2013-09-29 DIAGNOSIS — B349 Viral infection, unspecified: Secondary | ICD-10-CM

## 2013-09-29 DIAGNOSIS — B9789 Other viral agents as the cause of diseases classified elsewhere: Secondary | ICD-10-CM

## 2013-09-29 LAB — COMPREHENSIVE METABOLIC PANEL
ALT: 21 U/L (ref 0–35)
BUN: 18 mg/dL (ref 6–23)
CO2: 26 mEq/L (ref 19–32)
Calcium: 9.4 mg/dL (ref 8.4–10.5)
Chloride: 106 mEq/L (ref 96–112)
Creat: 0.77 mg/dL (ref 0.50–1.10)
Glucose, Bld: 86 mg/dL (ref 70–99)

## 2013-09-29 LAB — CBC WITH DIFFERENTIAL/PLATELET
Eosinophils Absolute: 0.2 10*3/uL (ref 0.0–0.7)
Eosinophils Relative: 3 % (ref 0–5)
Lymphs Abs: 2.6 10*3/uL (ref 0.7–4.0)
MCHC: 34.4 g/dL (ref 30.0–36.0)
Monocytes Absolute: 0.5 10*3/uL (ref 0.1–1.0)
Monocytes Relative: 6 % (ref 3–12)
Neutro Abs: 4.6 10*3/uL (ref 1.7–7.7)
Neutrophils Relative %: 58 % (ref 43–77)
Platelets: 241 10*3/uL (ref 150–400)
RDW: 12.9 % (ref 11.5–15.5)

## 2013-09-29 NOTE — Progress Notes (Signed)
Subjective:    Patient ID: Megan Combs, female    DOB: [birth year], 52 y.o.   MRN: [id number]  HPI  Megan Combs is here for acute visit.  She attended her nephews wedding the week-end before Thanksgiving and several attendees came down with vomiting and diarrheal illness.  She was chilling and temp was 99.  Diarrhea lasted 3 days  Megan Combs returned to work that Monday  (she is a Physiological scientist at [employer]) .  Had acute vomiting episode and L sided chest pain and diaphoresis at  While at work.  EMS was called  ,  EKG was done (pt told it was OK), and she was not transported for hospital eval.  I do not have copy of EKG  Appetite back to normal.  Stool consistency back to normal now.  No further chest pain or pressure  .    Allergies  Allergen Reactions  . Dulcolax [Bisacodyl]     Dehydration   . Penicillins Hives  . Tape Dermatitis  . Zithromax [Azithromycin] Rash   Past Medical History  Diagnosis Date  . Depression   . Arthritis     right knee  . Fibromyalgia   . Degenerative disc disease   . Fibromyalgia   . Plantar fasciitis of left foot    Past Surgical History  Procedure Laterality Date  . Knee arthroscopy    . Tonsillectomy    . Laproscopy    . Knee arthroscopy     History   Social History  . Marital Status: Married    Spouse Name: N/A    Number of Children: N/A  . Years of Education: N/A   Occupational History  . RN    Social History Main Topics  . Smoking status: Never Smoker   . Smokeless tobacco: Never Used  . Alcohol Use: No  . Drug Use: No  . Sexual Activity: Yes    Birth Control/ Protection: Post-menopausal   Other Topics Concern  . Not on file   Social History Narrative  . No narrative on file   Family History  Problem Relation Age of Onset  . Colon cancer Father   . Esophageal cancer Father   . Cancer Father   . Stroke Father   . Heart disease Father   . Hypertension Father   . Hyperlipidemia Father   . Heart disease Mother   . Diabetes  Mother   . Arthritis Mother   . Hypertension Sister   . Diabetes Maternal Grandfather    Patient Active Problem List   Diagnosis Date Noted  . Breast density 03/17/2013  . Renal cyst, left 01/06/2013  . Family history of colon cancer 01/03/2013  . Elevated alkaline phosphatase measurement 01/03/2013  . Articular cartilage disorder of knee 12/24/2012  . Plantar fasciitis 07/21/2012  . Anxiety 07/02/2012  . History of anemia 07/01/2012  . Depression 07/01/2012  . Menopause 07/01/2012  . Menopausal hot flushes 07/01/2012  . DJD (degenerative joint disease) 07/01/2012  . Other and unspecified hyperlipidemia 07/01/2012  . Vaginal dryness, menopausal 07/01/2012  . Urge incontinence of urine 07/01/2012  . Fibromyalgia   . Family history of malignant neoplasm 12/25/2011  . Personal history of colonic polyps 12/25/2011   Current Outpatient Prescriptions on File Prior to Visit  Medication Sig Dispense Refill  . b complex vitamins tablet Take 1 tablet by mouth daily.      Marland Kitchen buPROPion (WELLBUTRIN XL) 300 MG 24 hr tablet Take 1 tablet (300 mg total) by mouth  daily.  30 tablet  1  . Calcium Carbonate-Vitamin D (CALCIUM 500 + D) 500-125 MG-UNIT TABS Take 1 tablet by mouth daily.      Marland Kitchen estradiol (ESTRACE) 1 MG tablet Take 1 tablet (1 mg total) by mouth daily.  30 tablet  1  . ferrous sulfate 325 (65 FE) MG tablet Take 325 mg by mouth daily with breakfast.      . fish oil-omega-3 fatty acids 1000 MG capsule Take 1 g by mouth daily.      Marland Kitchen glucosamine-chondroitin 500-400 MG tablet Take 3 tablets by mouth daily.      . Magnesium 250 MG TABS Take 500 mg by mouth daily.      . Multiple Vitamin (MULITIVITAMIN WITH MINERALS) TABS Take 1 tablet by mouth daily.      . progesterone (PROMETRIUM) 100 MG capsule Take 1 capsule (100 mg total) by mouth daily.  30 capsule  3  . venlafaxine XR (EFFEXOR XR) 37.5 MG 24 hr capsule Take 1 capsule (37.5 mg total) by mouth daily.  45 capsule  1  .  amphetamine-dextroamphetamine (ADDERALL) 10 MG tablet Take 10 mg by mouth 2 (two) times daily.      Marland Kitchen LORazepam (ATIVAN) 0.5 MG tablet 1/2 to 1 tablet at night as needed for sleep  30 tablet  0   No current facility-administered medications on file prior to visit.      Review of Systems See HPI    Objective:   Physical Exam  Physical Exam  Nursing note and vitals reviewed.  Constitutional: She is oriented to person, place, and time. She appears well-developed and well-nourished.  HENT:  Head: Normocephalic and atraumatic.  Cardiovascular: Normal rate and regular rhythm. Exam reveals no gallop and no friction rub.  No murmur heard.  Pulmonary/Chest: Breath sounds normal. She has no wheezes. She has no rales. ABD  Soft NT/ND  BS +   NO HSM   NO pain to palpation in any quadrant  Neurological: She is alert and oriented to person, place, and time.  Skin: Skin is warm and dry.  Psychiatric: She has a normal mood and affect. Her behavior is normal.            Assessment & Plan:  Gastroenteritis  -  Probable food toxin related  U/A is normal  Will check creatilnine  And lfts  Symptoms resolved  L sided chest pain  EKG no acute change    She has shortened PR interval and Dr. Armanda Magic is her cardiologist.  See me if not better

## 2013-09-30 ENCOUNTER — Telehealth: Payer: Self-pay | Admitting: *Deleted

## 2013-09-30 ENCOUNTER — Encounter: Payer: Self-pay | Admitting: *Deleted

## 2013-09-30 NOTE — Telephone Encounter (Signed)
LVM message regarding recent lab results and a follow up appt.

## 2013-09-30 NOTE — Telephone Encounter (Signed)
Message copied by Mathews Robinsons on Thu Sep 30, 2013 11:19 AM ------      Message from: Raechel Chute D      Created: Thu Sep 30, 2013 11:02 AM       Karen Kitchens            Call pt and let her know her labs look fine and her alkaline phosphatase is slightly elevated as it has been in the past  Ok to mail to her.  Give her an office visit with me in 4-6 weeks ------

## 2013-10-05 ENCOUNTER — Other Ambulatory Visit: Payer: Self-pay | Admitting: *Deleted

## 2013-10-05 ENCOUNTER — Telehealth: Payer: Self-pay | Admitting: Gastroenterology

## 2013-10-05 ENCOUNTER — Encounter: Payer: Self-pay | Admitting: Internal Medicine

## 2013-10-05 ENCOUNTER — Telehealth: Payer: Self-pay | Admitting: Internal Medicine

## 2013-10-05 NOTE — Telephone Encounter (Signed)
Spoke with pt and gave her lab results.   She is still having some dyspeptic symptoms and ate peanut butter last night with sharp abd discomfort.  She states she wishes to get appt with her GI MD.  I agree with this,  I advised she may also take OTC prilosec or Prevacid and if pain, fever, vomiting occurs she is to go to ER for eval.   She voices understanding

## 2013-10-05 NOTE — Telephone Encounter (Signed)
Refill request last MM 02/14

## 2013-10-05 NOTE — Telephone Encounter (Signed)
Pt spoke with her PCP who recommended prilosec and f/u with GI appt made with Dr Christella Hartigan for 11/09/13.  Pt will call questions or worsening symptoms

## 2013-10-06 MED ORDER — PROGESTERONE MICRONIZED 100 MG PO CAPS: 100.0000 mg | ORAL_CAPSULE | Freq: Every day | ORAL | Status: DC

## 2013-10-13 ENCOUNTER — Telehealth: Payer: Self-pay | Admitting: *Deleted

## 2013-10-13 NOTE — Telephone Encounter (Signed)
Megan Combs would like you to call her back; she needs to give you some information.

## 2013-10-14 NOTE — Telephone Encounter (Signed)
Returned pts call pt states that she found out that three people have tested + for samonella from the same event pt reports that she is feeling better

## 2013-10-25 ENCOUNTER — Telehealth: Payer: Self-pay | Admitting: *Deleted

## 2013-10-25 NOTE — Telephone Encounter (Signed)
LV message for pt to return callregarding rescheduling her appt

## 2013-10-25 NOTE — Telephone Encounter (Signed)
Pt returned call

## 2013-10-26 ENCOUNTER — Ambulatory Visit: Payer: BC Managed Care – PPO | Admitting: Internal Medicine

## 2013-10-27 ENCOUNTER — Encounter: Payer: Self-pay | Admitting: Internal Medicine

## 2013-10-27 ENCOUNTER — Ambulatory Visit (INDEPENDENT_AMBULATORY_CARE_PROVIDER_SITE_OTHER): Payer: BC Managed Care – PPO | Admitting: Internal Medicine

## 2013-10-27 VITALS — BP 115/67 | HR 78 | Temp 98.3°F | Resp 18

## 2013-10-27 DIAGNOSIS — R748 Abnormal levels of other serum enzymes: Secondary | ICD-10-CM

## 2013-10-27 DIAGNOSIS — A029 Salmonella infection, unspecified: Secondary | ICD-10-CM

## 2013-10-27 LAB — COMPREHENSIVE METABOLIC PANEL
ALT: 11 U/L (ref 0–35)
Albumin: 4 g/dL (ref 3.5–5.2)
CO2: 27 mEq/L (ref 19–32)
Potassium: 3.8 mEq/L (ref 3.5–5.3)
Sodium: 141 mEq/L (ref 135–145)
Total Bilirubin: 0.4 mg/dL (ref 0.3–1.2)
Total Protein: 6.9 g/dL (ref 6.0–8.3)

## 2013-10-27 NOTE — Patient Instructions (Signed)
See me as needed 

## 2013-10-27 NOTE — Progress Notes (Signed)
Subjective:    Patient ID: Megan Combs, female    DOB: [birth year], 52 y.o.   MRN: [id number]  HPI Megan Combs is here for follow up.    She reports she found out that others with her similar GI illness  That she had were positive for Salmonella.  She tells me she feels she is back to almost 100%   She is eating just about her normal diet.    See alk phos.  Slightly elevated   She has unremarkable liver ultrasound and neg ANA and anti-mitochondrial antibody.      Allergies  Allergen Reactions  . Dulcolax [Bisacodyl]     Dehydration   . Penicillins Hives  . Tape Dermatitis  . Zithromax [Azithromycin] Rash   Past Medical History  Diagnosis Date  . Depression   . Arthritis     right knee  . Fibromyalgia   . Degenerative disc disease   . Fibromyalgia   . Plantar fasciitis of left foot    Past Surgical History  Procedure Laterality Date  . Knee arthroscopy    . Tonsillectomy    . Laproscopy    . Knee arthroscopy     History   Social History  . Marital Status: Married    Spouse Name: N/A    Number of Children: N/A  . Years of Education: N/A   Occupational History  . RN    Social History Main Topics  . Smoking status: Never Smoker   . Smokeless tobacco: Never Used  . Alcohol Use: No  . Drug Use: No  . Sexual Activity: Yes    Birth Control/ Protection: Post-menopausal   Other Topics Concern  . Not on file   Social History Narrative  . No narrative on file   Family History  Problem Relation Age of Onset  . Colon cancer Father   . Esophageal cancer Father   . Cancer Father   . Stroke Father   . Heart disease Father   . Hypertension Father   . Hyperlipidemia Father   . Heart disease Mother   . Diabetes Mother   . Arthritis Mother   . Hypertension Sister   . Diabetes Maternal Grandfather    Patient Active Problem List   Diagnosis Date Noted  . Salmonella 10/27/2013  . Breast density 03/17/2013  . Renal cyst, left 01/06/2013  . Family history of colon  cancer 01/03/2013  . Elevated alkaline phosphatase measurement 01/03/2013  . Articular cartilage disorder of knee 12/24/2012  . Plantar fasciitis 07/21/2012  . Anxiety 07/02/2012  . History of anemia 07/01/2012  . Depression 07/01/2012  . Menopause 07/01/2012  . Menopausal hot flushes 07/01/2012  . DJD (degenerative joint disease) 07/01/2012  . Other and unspecified hyperlipidemia 07/01/2012  . Vaginal dryness, menopausal 07/01/2012  . Urge incontinence of urine 07/01/2012  . Fibromyalgia   . Family history of malignant neoplasm 12/25/2011  . Personal history of colonic polyps 12/25/2011   Current Outpatient Prescriptions on File Prior to Visit  Medication Sig Dispense Refill  . amphetamine-dextroamphetamine (ADDERALL) 10 MG tablet Take 10 mg by mouth 2 (two) times daily.      Marland Kitchen b complex vitamins tablet Take 1 tablet by mouth daily.      Marland Kitchen buPROPion (WELLBUTRIN XL) 300 MG 24 hr tablet Take 1 tablet (300 mg total) by mouth daily.  30 tablet  1  . Calcium Carbonate-Vitamin D (CALCIUM 500 + D) 500-125 MG-UNIT TABS Take 1 tablet by mouth daily.      Marland Kitchen  estradiol (ESTRACE) 1 MG tablet Take 1 tablet (1 mg total) by mouth daily.  30 tablet  1  . ferrous sulfate 325 (65 FE) MG tablet Take 325 mg by mouth daily with breakfast.      . fish oil-omega-3 fatty acids 1000 MG capsule Take 1 g by mouth daily.      Marland Kitchen glucosamine-chondroitin 500-400 MG tablet Take 3 tablets by mouth daily.      Marland Kitchen LORazepam (ATIVAN) 0.5 MG tablet 1/2 to 1 tablet at night as needed for sleep  30 tablet  0  . Magnesium 250 MG TABS Take 500 mg by mouth daily.      . Multiple Vitamin (MULITIVITAMIN WITH MINERALS) TABS Take 1 tablet by mouth daily.      . progesterone (PROMETRIUM) 100 MG capsule Take 1 capsule (100 mg total) by mouth daily.  30 capsule  6  . venlafaxine XR (EFFEXOR XR) 37.5 MG 24 hr capsule Take 1 capsule (37.5 mg total) by mouth daily.  45 capsule  1   No current facility-administered medications on file  prior to visit.      Review of Systems    see HPI Objective:   Physical Exam  Physical Exam  Nursing note and vitals reviewed.  Constitutional: She is oriented to person, place, and time. She appears well-developed and well-nourished.  HENT:  Head: Normocephalic and atraumatic.  Cardiovascular: Normal rate and regular rhythm. Exam reveals no gallop and no friction rub.  No murmur heard.  Pulmonary/Chest: Breath sounds normal. She has no wheezes. She has no rales.  Abd:  BS positive  No HSM  Non tender to palpation Neurological: She is alert and oriented to person, place, and time.  Skin: Skin is warm and dry.  Psychiatric: She has a normal mood and affect. Her behavior is normal.        Assessment & Plan:  LIkely Salmonella GE  Improved     Elevated alkaline phosphatase:   U/S  Unrevealing.     Will recheck today further management based on results.   Discussed if continue to elevate may need further  GI evaluation or bone scanning Pt voices understanding

## 2013-10-28 ENCOUNTER — Encounter: Payer: Self-pay | Admitting: Internal Medicine

## 2013-11-01 ENCOUNTER — Encounter: Payer: Self-pay | Admitting: *Deleted

## 2013-11-05 ENCOUNTER — Other Ambulatory Visit: Payer: Self-pay | Admitting: Gynecology

## 2013-11-05 DIAGNOSIS — N951 Menopausal and female climacteric states: Secondary | ICD-10-CM

## 2013-11-05 MED ORDER — ESTRADIOL 1 MG PO TABS: 1.0000 mg | ORAL_TABLET | Freq: Every day | ORAL | Status: DC

## 2013-11-05 NOTE — Telephone Encounter (Signed)
Pt is requesting a refill for estradiol. Please call this into [facility] @ [place].

## 2013-11-05 NOTE — Telephone Encounter (Signed)
Patient notified thanks.

## 2013-11-05 NOTE — Telephone Encounter (Signed)
Last refilled: 08/04/13 #30/1 refill  Last AEX: 05/14/13  Last Mammogram:   S/w patient she said she is completely out and the plan with Dr.Lathrop was to have her wean off the estradiol and start on a patch. Patient says she was sick over the holidays and she doesn't want to go off the estradiol.  Given Dr.Lathrop out okay to refill?

## 2013-11-08 ENCOUNTER — Other Ambulatory Visit: Payer: Self-pay | Admitting: Gynecology

## 2013-11-08 DIAGNOSIS — N951 Menopausal and female climacteric states: Secondary | ICD-10-CM

## 2013-11-08 MED ORDER — VENLAFAXINE HCL ER 37.5 MG PO CP24: 37.5000 mg | ORAL_CAPSULE | Freq: Every day | ORAL | Status: DC

## 2013-11-08 NOTE — Telephone Encounter (Signed)
LM on patient's vm (Confirmed #) that rx has been sent.

## 2013-11-08 NOTE — Telephone Encounter (Signed)
Last Refilled: 08/04/13 #45/1 refill Last AEX: 05/14/13  No current AEX scheduled   Please advise.

## 2013-11-08 NOTE — Telephone Encounter (Signed)
Patient wants refill of  venlafaxine XR (EFFEXOR XR) 37.5 MG 24 hr capsule  Take 1 capsule (37.5 mg total) by mouth daily., Starting 08/04/2013, Until Discontinued, Normal, Last Dose: Not Recorded  Refills: 1 ordered Pharmacy: [facility], [place]

## 2013-11-09 ENCOUNTER — Encounter: Payer: Self-pay | Admitting: Gastroenterology

## 2013-11-09 ENCOUNTER — Ambulatory Visit (INDEPENDENT_AMBULATORY_CARE_PROVIDER_SITE_OTHER): Payer: BC Managed Care – PPO | Admitting: Gastroenterology

## 2013-11-09 VITALS — BP 128/74 | HR 88 | Ht 66.14 in | Wt 157.1 lb

## 2013-11-09 DIAGNOSIS — R198 Other specified symptoms and signs involving the digestive system and abdomen: Secondary | ICD-10-CM

## 2013-11-09 MED ORDER — HYOSCYAMINE SULFATE ER 0.375 MG PO TB12: 0.3750 mg | ORAL_TABLET | Freq: Two times a day (BID) | ORAL | Status: DC

## 2013-11-09 NOTE — Patient Instructions (Addendum)
Take one gas-ex pill with every single meal for now.  Also PRN at bedtime. Begin twice daily antispasm medicine, newly called in. Reintroduce whole grains in your diet. Continue to avoid excessive fruits, veggies (especially salad, greens). Repeat labs (alk phos) in 3 months. Please return to see Dr. Ardis Hughs in 2 months, sooner if needed.

## 2013-11-09 NOTE — Progress Notes (Signed)
Review of pertinent gastrointestinal problems:  1. Elevated risk for colon cancer: Personal history of adenomatous colon polyps and FH of colon cancer: father had colon cancer in 50's, she had polyp May 2007 removed by Dr. Sammuel Cooper (unclear pathology), colonoscopy 10/2011 Dr. Ardis Hughs found small tubular adenoma, recall recommended at 5 year interval   HPI: This is a   very pleasant 53 year old woman whom I last saw a year or 2 ago.  Thought she had food poisening.  Church dinner, wedding reception.  30 people became acutely ill with acute vomiting, diarrheal illness.  3 of them were proven (per patient) to have salmonella.  November 2014.    She had diarrhea, non bloody for about 5 days.  She felt overall terrible.  Vomiting at first.  Her bowels have not completely recovered. She is a bit more constipated than previously.  Smaller quantities of stool.    She took prevacid for about two weeks, this helped with post infectious cramping.  She is trying to get back to her usual diet.  Becomes bloated a lot.  Very gassy at times.  More cramping than usual, post prandial especially.  Stomach very rumbly.  Gas ex does help.  Not tried antispasm meds.   Review of systems: Pertinent positive and negative review of systems were noted in the above HPI section. Complete review of systems was performed and was otherwise normal.    Past Medical History  Diagnosis Date  . Depression   . Arthritis     right knee  . Fibromyalgia   . Degenerative disc disease   . Fibromyalgia   . Plantar fasciitis of left foot     Past Surgical History  Procedure Laterality Date  . Knee arthroscopy    . Tonsillectomy    . Laproscopy    . Knee arthroscopy      Current Outpatient Prescriptions  Medication Sig Dispense Refill  . b complex vitamins tablet Take 1 tablet by mouth daily.      Marland Kitchen buPROPion (WELLBUTRIN XL) 300 MG 24 hr tablet Take 1 tablet (300 mg total) by mouth daily.  30 tablet  1  . Calcium  Carbonate-Vitamin D (CALCIUM 500 + D) 500-125 MG-UNIT TABS Take 1 tablet by mouth daily.      Marland Kitchen estradiol (ESTRACE) 1 MG tablet Take 1 tablet (1 mg total) by mouth daily.  30 tablet  1  . ferrous sulfate 325 (65 FE) MG tablet Take 325 mg by mouth daily with breakfast.      . fish oil-omega-3 fatty acids 1000 MG capsule Take 1 g by mouth daily.      Marland Kitchen glucosamine-chondroitin 500-400 MG tablet Take 3 tablets by mouth daily.      . Multiple Vitamin (MULITIVITAMIN WITH MINERALS) TABS Take 1 tablet by mouth daily.      . progesterone (PROMETRIUM) 100 MG capsule Take 1 capsule (100 mg total) by mouth daily.  30 capsule  6  . venlafaxine XR (EFFEXOR XR) 37.5 MG 24 hr capsule Take 1 capsule (37.5 mg total) by mouth daily.  90 capsule  2   No current facility-administered medications for this visit.    Allergies as of 11/09/2013 - Review Complete 11/09/2013  Allergen Reaction Noted  . Dulcolax [bisacodyl]  11/06/2011  . Penicillins Hives 05/28/2012  . Tape Dermatitis 05/28/2012  . Zithromax [azithromycin] Rash 11/06/2011    Family History  Problem Relation Age of Onset  . Colon cancer Father   . Esophageal cancer Father   .  Cancer Father   . Stroke Father   . Heart disease Father   . Hypertension Father   . Hyperlipidemia Father   . Heart disease Mother   . Diabetes Mother   . Arthritis Mother   . Hypertension Sister   . Diabetes Maternal Grandfather     History   Social History  . Marital Status: Married    Spouse Name: N/A    Number of Children: N/A  . Years of Education: N/A   Occupational History  . RN    Social History Main Topics  . Smoking status: Never Smoker   . Smokeless tobacco: Never Used  . Alcohol Use: No  . Drug Use: No  . Sexual Activity: Yes    Birth Control/ Protection: Post-menopausal   Other Topics Concern  . Not on file   Social History Narrative  . No narrative on file       Physical Exam: BP 128/74  Pulse 88  Ht 5' 6.14" (1.68 m)  Wt  157 lb 2 oz (71.271 kg)  BMI 25.25 kg/m2  LMP 10/28/2008 Constitutional: generally well-appearing Psychiatric: alert and oriented x3 Eyes: extraocular movements intact Mouth: oral pharynx moist, no lesions Neck: supple no lymphadenopathy Cardiovascular: heart regular rate and rhythm Lungs: clear to auscultation bilaterally Abdomen: soft, nontender, nondistended, no obvious ascites, no peritoneal signs, normal bowel sounds Extremities: no lower extremity edema bilaterally Skin: no lesions on visible extremities    Assessment and plan: 53 y.o. female with  recent acute vomiting diarrhea illness  She was at a wedding where 30 people became very sick with a gastroenteritis. She contacted the health department after she developed symptoms she tells me that 3 of those people at the wedding were positive for Salmonella. It sounds as if she had salmonella poisoning as well 2. She has recovered from her acute illness but she is still bothered because her bowels are really not back to normal. I explained her that this is not uncommon at all especially after serious illness such as the one she had. I see no reason for invasive testing at this point. I recommended twice daily and he spasmodics as well as Gas-X with every meal. She is going to start reintroducing some whole gr in her diet. She will return to see me in 2 months and sooner if needed and

## 2013-11-16 ENCOUNTER — Telehealth: Payer: Self-pay | Admitting: Gastroenterology

## 2013-11-16 NOTE — Telephone Encounter (Signed)
Pt was prescribed hyoscyamine at her last office visit and states the medication is making her sleepy, she wants to know if she can half the dose?

## 2013-11-17 NOTE — Telephone Encounter (Signed)
The patient has been notified of this information and all questions answered.

## 2013-11-17 NOTE — Telephone Encounter (Signed)
She can definitely try 1/2 the dose

## 2013-12-06 ENCOUNTER — Encounter: Payer: Self-pay | Admitting: *Deleted

## 2013-12-27 ENCOUNTER — Other Ambulatory Visit (INDEPENDENT_AMBULATORY_CARE_PROVIDER_SITE_OTHER): Payer: BC Managed Care – PPO

## 2013-12-27 ENCOUNTER — Encounter: Payer: Self-pay | Admitting: Gastroenterology

## 2013-12-27 ENCOUNTER — Ambulatory Visit (INDEPENDENT_AMBULATORY_CARE_PROVIDER_SITE_OTHER): Payer: BC Managed Care – PPO | Admitting: Gastroenterology

## 2013-12-27 VITALS — BP 112/80 | HR 72 | Ht 66.0 in | Wt 160.2 lb

## 2013-12-27 DIAGNOSIS — R142 Eructation: Secondary | ICD-10-CM

## 2013-12-27 DIAGNOSIS — K589 Irritable bowel syndrome without diarrhea: Secondary | ICD-10-CM

## 2013-12-27 DIAGNOSIS — R141 Gas pain: Secondary | ICD-10-CM

## 2013-12-27 DIAGNOSIS — R198 Other specified symptoms and signs involving the digestive system and abdomen: Secondary | ICD-10-CM

## 2013-12-27 DIAGNOSIS — R143 Flatulence: Secondary | ICD-10-CM

## 2013-12-27 DIAGNOSIS — R140 Abdominal distension (gaseous): Secondary | ICD-10-CM

## 2013-12-27 LAB — COMPREHENSIVE METABOLIC PANEL
ALBUMIN: 3.8 g/dL (ref 3.5–5.2)
ALT: 19 U/L (ref 0–35)
AST: 33 U/L (ref 0–37)
Alkaline Phosphatase: 106 U/L (ref 39–117)
BUN: 19 mg/dL (ref 6–23)
CALCIUM: 9.7 mg/dL (ref 8.4–10.5)
CHLORIDE: 107 meq/L (ref 96–112)
CO2: 27 meq/L (ref 19–32)
CREATININE: 0.7 mg/dL (ref 0.4–1.2)
GFR: 87.63 mL/min (ref >60.00)
Glucose, Bld: 77 mg/dL (ref 70–99)
POTASSIUM: 4 meq/L (ref 3.5–5.1)
Sodium: 139 mEq/L (ref 135–145)
TOTAL PROTEIN: 7.4 g/dL (ref 6.0–8.3)
Total Bilirubin: 0.5 mg/dL (ref 0.3–1.2)

## 2013-12-27 LAB — ALKALINE PHOSPHATASE: Alkaline Phosphatase: 106 U/L (ref 39–117)

## 2013-12-27 NOTE — Patient Instructions (Addendum)
You will have labs checked today in the basement lab.  Please head down after you check out with the front desk  (alkaline phos). OK to start small amount of spinach. Try OTC H2 blocker (zantac or pecid or generic equivalent) at bedtime if you think that you may have GERD overnight. Please return to see Dr. Ardis Hughs in 3 months.

## 2013-12-27 NOTE — Progress Notes (Signed)
Review of pertinent gastrointestinal problems:  1. Elevated risk for colon cancer: Personal history of adenomatous colon polyps and FH of colon cancer: father had colon cancer in 50's, she had polyp May 2007 removed by Dr. Sammuel Cooper (unclear pathology), colonoscopy 10/2011 Dr. Ardis Hughs found small tubular adenoma, recall recommended at 5 year interval 2. Acute vomiting, diarrheal illness 08/2013; was at wedding, several people also very ill, 3 of them were proven to have Salmonella.  Presumed she also had salmonella, was bothered with post infectious change in bowels.    HPI: This is a  very pleasant 53 year old woman whom I last saw about 2 months ago.  Trial of twice daily levbid, she called back and cut back due to fatigue.  To 1/2 pill bid.  Was on gas-ex before meals.  After about a month cut back completely.  Works with nutritionist who recommended probiotic and OTC digestive enzymes.  She really believes these are helping.  Stools have been very pastie.  Goes 1-2 times per day, keeps form but not "as formed as prior to the acute illness".  Has been very gassy.  Gas ex is really helping this.  Thinks she smells old blood with her passing gas.  Not having dark stools.  Has been on iron three times per week.   She realizes that she is sensitive to gluten.  Can tell she is lactose intolerant, more so than in the past.    She found FODMAP diet and has been following it as best that she can.  Avoiding lettuce, greens.    Wonders if she can try spinach (I told her this is ok).  Has been gaining weight.  No overt gi bleeding.    Past Medical History  Diagnosis Date  . Depression   . Arthritis     right knee  . Fibromyalgia   . Degenerative disc disease   . Fibromyalgia   . Plantar fasciitis of left foot     Past Surgical History  Procedure Laterality Date  . Knee arthroscopy    . Tonsillectomy    . Laproscopy    . Knee arthroscopy      Current Outpatient Prescriptions   Medication Sig Dispense Refill  . b complex vitamins tablet Take 1 tablet by mouth daily.      Marland Kitchen buPROPion (WELLBUTRIN XL) 300 MG 24 hr tablet Take 1 tablet (300 mg total) by mouth daily.  30 tablet  1  . Calcium Carbonate-Vitamin D (CALCIUM 500 + D) 500-125 MG-UNIT TABS Take 1 tablet by mouth daily.      . Digestive Enzyme CAPS Take 1 capsule by mouth daily.      Marland Kitchen estradiol (ESTRACE) 1 MG tablet Take 1 tablet (1 mg total) by mouth daily.  30 tablet  1  . ferrous sulfate 325 (65 FE) MG tablet Take 325 mg by mouth 3 (three) times a week.       . fish oil-omega-3 fatty acids 1000 MG capsule Take 1 g by mouth daily.      Marland Kitchen FLORA-Q (FLORA-Q) CAPS capsule Take 1 capsule by mouth daily.      Marland Kitchen glucosamine-chondroitin 500-400 MG tablet Take 3 tablets by mouth daily.      . hyoscyamine (LEVBID) 0.375 MG 12 hr tablet Take 1 tablet (0.375 mg total) by mouth 2 (two) times daily.  60 tablet  11  . Multiple Vitamin (MULITIVITAMIN WITH MINERALS) TABS Take 1 tablet by mouth daily.      . progesterone ([place])  100 MG capsule Take 1 capsule (100 mg total) by mouth daily.  30 capsule  6  . venlafaxine XR (EFFEXOR XR) 37.5 MG 24 hr capsule Take 1 capsule (37.5 mg total) by mouth daily.  90 capsule  2   No current facility-administered medications for this visit.    Allergies as of 12/27/2013 - Review Complete 12/27/2013  Allergen Reaction Noted  . Dulcolax [bisacodyl]  11/06/2011  . Penicillins Hives 05/28/2012  . Tape Dermatitis 05/28/2012  . Zithromax [azithromycin] Rash 11/06/2011    Family History  Problem Relation Age of Onset  . Colon cancer Father   . Esophageal cancer Father   . Cancer Father   . Stroke Father   . Heart disease Father   . Hypertension Father   . Hyperlipidemia Father   . Heart disease Mother   . Diabetes Mother   . Arthritis Mother   . Hypertension Sister   . Diabetes Maternal Grandfather     History   Social History  . Marital Status: Married    Spouse  Name: N/A    Number of Children: N/A  . Years of Education: N/A   Occupational History  . RN    Social History Main Topics  . Smoking status: Never Smoker   . Smokeless tobacco: Never Used  . Alcohol Use: No  . Drug Use: No  . Sexual Activity: Yes    Birth Control/ Protection: Post-menopausal   Other Topics Concern  . Not on file   Social History Narrative  . 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 She has modified her diet. I did recommend she try H2 blocker at night to help with some minor reflux symptoms. She is gaining weight. Overall no alarm symptoms I think she is following a typical post infectious IBS course. She'll return to see me in 3 months and sooner if needed.

## 2013-12-31 ENCOUNTER — Other Ambulatory Visit: Payer: Self-pay | Admitting: *Deleted

## 2013-12-31 DIAGNOSIS — N951 Menopausal and female climacteric states: Secondary | ICD-10-CM

## 2013-12-31 MED ORDER — ESTRADIOL 1 MG PO TABS: 1.0000 mg | ORAL_TABLET | Freq: Every day | ORAL | Status: DC

## 2013-12-31 NOTE — Telephone Encounter (Signed)
Incoming fax from [facility] requesting Estradiol tablet.  Last AEX 05/14/2013. Plan with Dr.Lathrop was to have her wean off the estradiol and start on a patch. Last refill 11/05/2013 #30/1 refill No future appt scheduled.  MMG: 12/24/2012 BI-RADS1:NEG.   Please advise.

## 2014-01-18 ENCOUNTER — Encounter: Payer: Self-pay | Admitting: Internal Medicine

## 2014-01-31 ENCOUNTER — Ambulatory Visit (INDEPENDENT_AMBULATORY_CARE_PROVIDER_SITE_OTHER): Payer: BC Managed Care – PPO | Admitting: Family Medicine

## 2014-01-31 ENCOUNTER — Encounter: Payer: Self-pay | Admitting: Family Medicine

## 2014-01-31 VITALS — BP 127/83 | HR 62 | Ht 66.0 in | Wt 154.0 lb

## 2014-01-31 DIAGNOSIS — M25519 Pain in unspecified shoulder: Secondary | ICD-10-CM

## 2014-01-31 DIAGNOSIS — S4991XA Unspecified injury of right shoulder and upper arm, initial encounter: Secondary | ICD-10-CM

## 2014-01-31 DIAGNOSIS — S4980XA Other specified injuries of shoulder and upper arm, unspecified arm, initial encounter: Secondary | ICD-10-CM

## 2014-01-31 DIAGNOSIS — S46909A Unspecified injury of unspecified muscle, fascia and tendon at shoulder and upper arm level, unspecified arm, initial encounter: Secondary | ICD-10-CM

## 2014-01-31 DIAGNOSIS — M25511 Pain in right shoulder: Secondary | ICD-10-CM

## 2014-01-31 MED ORDER — OXYCODONE-ACETAMINOPHEN 5-325 MG PO TABS: 1.0000 | ORAL_TABLET | Freq: Four times a day (QID) | ORAL | Status: DC | PRN

## 2014-01-31 NOTE — Patient Instructions (Signed)
We will go ahead with an MRI of your shoulder to assess for a rotator cuff tear. Ice shoulder 15 minutes at a time 3-4 times a day. Ibuprofen 800mg  three times a day with food for pain and inflammation. Percocet every 6 hours as needed for severe pain. Sling regularly - come out a couple times a day to do arm circles, pendulums, table slides in a few days. Follow up will depend on MRI results.

## 2014-02-01 ENCOUNTER — Encounter: Payer: Self-pay | Admitting: Family Medicine

## 2014-02-01 DIAGNOSIS — S4991XA Unspecified injury of right shoulder and upper arm, initial encounter: Secondary | ICD-10-CM | POA: Insufficient documentation

## 2014-02-01 NOTE — Assessment & Plan Note (Signed)
Mechanism of injury along with her exam concerning for shoulder subluxation with rotator cuff tear (supraspinatus, infraspinatus).  Radiographs negative.  Advised we move forward with MRI to assess for full thickness rotator cuff tear.  In meantime use sling, icing, ibuprofen, percocet.  Follow up will depend on MRI results.

## 2014-02-01 NOTE — Progress Notes (Addendum)
Patient ID: Megan Combs, female   DOB: 03/08/1961, 53 y.o.   MRN: [id number]  PCP: Megan Pillar, MD  Subjective:   HPI: Patient is a 53 y.o. female here for right shoulder injury.  Patient reports on 4/5 she [place] the [place] with [place] 60 lb dog on a training leash. The dog saw a bird and chased it while her right hand was still around end of leash. Caused a traction injury with arm stretched in front of her in flexion and pulled her hard to ground onto left side. Laid on ground for several minutes until she could get up and got into a car, went to hospital. Was very somnolent, concern for intracranial hemorrhage so CT was done of head and also of neck which were negative. Right shoulder radiographs were negative.  No dislocation as well. Given zofran, morphine, toradol. Has been wearing a sling, taking percocet as needed for pain as well as ibuprofen. Denies prior injury.  Past Medical History  Diagnosis Date  . Depression   . Arthritis     right knee  . Fibromyalgia   . Degenerative disc disease   . Fibromyalgia   . Plantar fasciitis of left foot     Current Outpatient Prescriptions on File Prior to Visit  Medication Sig Dispense Refill  . buPROPion (WELLBUTRIN XL) 300 MG 24 hr tablet Take 1 tablet (300 mg total) by mouth daily.  30 tablet  1  . estradiol (ESTRACE) 1 MG tablet Take 1 tablet (1 mg total) by mouth daily.  30 tablet  3  . progesterone (PROMETRIUM) 100 MG capsule Take 1 capsule (100 mg total) by mouth daily.  30 capsule  6  . venlafaxine XR (EFFEXOR XR) 37.5 MG 24 hr capsule Take 1 capsule (37.5 mg total) by mouth daily.  90 capsule  2  . b complex vitamins tablet Take 1 tablet by mouth daily.      . Calcium Carbonate-Vitamin D (CALCIUM 500 + D) 500-125 MG-UNIT TABS Take 1 tablet by mouth daily.      . Digestive Enzyme CAPS Take 1 capsule by mouth daily.      . ferrous sulfate 325 (65 FE) MG tablet Take 325 mg by mouth 3 (three) times a week.       . fish  oil-omega-3 fatty acids 1000 MG capsule Take 1 g by mouth daily.      Marland Kitchen FLORA-Q (FLORA-Q) CAPS capsule Take 1 capsule by mouth daily.      Marland Kitchen glucosamine-chondroitin 500-400 MG tablet Take 3 tablets by mouth daily.      . hyoscyamine (LEVBID) 0.375 MG 12 hr tablet Take 1 tablet (0.375 mg total) by mouth 2 (two) times daily.  60 tablet  11  . Multiple Vitamin (MULITIVITAMIN WITH MINERALS) TABS Take 1 tablet by mouth daily.       No current facility-administered medications on file prior to visit.    Past Surgical History  Procedure Laterality Date  . Knee arthroscopy    . Tonsillectomy    . Laproscopy    . Knee arthroscopy      Allergies  Allergen Reactions  . Dulcolax [Bisacodyl]     Dehydration   . Penicillins Hives  . Tape Dermatitis  . Zithromax [Azithromycin] Rash    History   Social History  . Marital Status: Married    Spouse Name: N/A    Number of Children: N/A  . Years of Education: N/A   Occupational History  . RN  Social History Main Topics  . Smoking status: Never Smoker   . Smokeless tobacco: Never Used  . Alcohol Use: No  . Drug Use: No  . Sexual Activity: Yes    Birth Control/ Protection: Post-menopausal   Other Topics Concern  . Not on file   Social History Narrative  . No narrative on file    Family History  Problem Relation Age of Onset  . Colon cancer Father   . Esophageal cancer Father   . Cancer Father   . Stroke Father   . Heart disease Father   . Hypertension Father   . Hyperlipidemia Father   . Heart disease Mother   . Diabetes Mother   . Arthritis Mother   . Heart attack Mother   . Hypertension Sister   . Diabetes Maternal Grandfather     BP 127/83  Pulse 62  Ht 5\' 6"  (1.676 m)  Wt 154 lb (69.854 kg)  BMI 24.87 kg/m2  LMP 10/28/2008  Review of Systems: See HPI above.    Objective:  Physical Exam:  Gen: NAD  Right shoulder: No swelling, ecchymoses.  No gross deformity. Diffuse anterior to posterior  tenderness. Able to externally rotate to 70 degrees, very painful.  Full IR.  Flexion and abduction very limited also to 70 degrees. Only resisted IR is not painful - unable to position for empty can or ER. Positive Hawkins, Neers. Negative Yergasons. Unable to position for apprehension. Negative sulcus. NV intact distally.    Assessment & Plan:  1. Right shoulder injury - Mechanism of injury along with her exam concerning for shoulder subluxation with rotator cuff tear (supraspinatus, infraspinatus).  Radiographs negative.  Advised we move forward with MRI to assess for full thickness rotator cuff tear.  In meantime use sling, icing, ibuprofen, percocet.  Follow up will depend on MRI results.  Addendum:  MRI reviewed and discussed with patient.  No evidence of labral tear or rotator cuff tear fortunately.  No sequelae of dislocation, subluxation.  She does have bursitis, tendinopathy and lateral trapezius tear.  She should do well with conservative measures.  Will start physical therapy.  Icing, ibuprofen, percocet.  F/u in 2 weeks to reassess.

## 2014-02-02 ENCOUNTER — Other Ambulatory Visit: Payer: Self-pay | Admitting: *Deleted

## 2014-02-02 ENCOUNTER — Ambulatory Visit
Admission: RE | Admit: 2014-02-02 | Discharge: 2014-02-02 | Disposition: A | Discharge status: Home / Self Care | Payer: BC Managed Care – PPO | Source: Ambulatory Visit | Attending: Family Medicine | Admitting: Family Medicine

## 2014-02-02 DIAGNOSIS — M25519 Pain in unspecified shoulder: Secondary | ICD-10-CM

## 2014-02-02 DIAGNOSIS — M25511 Pain in right shoulder: Secondary | ICD-10-CM

## 2014-02-02 DIAGNOSIS — S4991XA Unspecified injury of right shoulder and upper arm, initial encounter: Secondary | ICD-10-CM

## 2014-02-03 ENCOUNTER — Ambulatory Visit: Payer: BC Managed Care – PPO | Attending: Family Medicine

## 2014-02-03 DIAGNOSIS — M25619 Stiffness of unspecified shoulder, not elsewhere classified: Secondary | ICD-10-CM | POA: Insufficient documentation

## 2014-02-03 DIAGNOSIS — R5381 Other malaise: Secondary | ICD-10-CM | POA: Insufficient documentation

## 2014-02-03 DIAGNOSIS — IMO0001 Reserved for inherently not codable concepts without codable children: Secondary | ICD-10-CM | POA: Insufficient documentation

## 2014-02-03 DIAGNOSIS — M25519 Pain in unspecified shoulder: Secondary | ICD-10-CM | POA: Insufficient documentation

## 2014-02-06 ENCOUNTER — Encounter: Payer: Self-pay | Admitting: Family Medicine

## 2014-02-08 ENCOUNTER — Ambulatory Visit: Payer: BC Managed Care – PPO

## 2014-02-08 ENCOUNTER — Ambulatory Visit (HOSPITAL_BASED_OUTPATIENT_CLINIC_OR_DEPARTMENT_OTHER)
Admission: RE | Admit: 2014-02-08 | Discharge: 2014-02-08 | Disposition: A | Discharge status: Home / Self Care | Payer: BC Managed Care – PPO | Source: Ambulatory Visit | Attending: Family Medicine | Admitting: Family Medicine

## 2014-02-08 ENCOUNTER — Ambulatory Visit (INDEPENDENT_AMBULATORY_CARE_PROVIDER_SITE_OTHER): Payer: BC Managed Care – PPO | Admitting: Family Medicine

## 2014-02-08 VITALS — BP 127/79 | HR 72 | Ht 66.0 in | Wt 154.0 lb

## 2014-02-08 DIAGNOSIS — M25559 Pain in unspecified hip: Secondary | ICD-10-CM

## 2014-02-08 DIAGNOSIS — W19XXXA Unspecified fall, initial encounter: Secondary | ICD-10-CM | POA: Insufficient documentation

## 2014-02-08 DIAGNOSIS — M25551 Pain in right hip: Secondary | ICD-10-CM

## 2014-02-10 ENCOUNTER — Ambulatory Visit: Payer: BC Managed Care – PPO

## 2014-02-11 ENCOUNTER — Ambulatory Visit: Payer: BC Managed Care – PPO | Admitting: Physical Therapy

## 2014-02-15 ENCOUNTER — Ambulatory Visit (INDEPENDENT_AMBULATORY_CARE_PROVIDER_SITE_OTHER): Payer: BC Managed Care – PPO | Admitting: Family Medicine

## 2014-02-15 ENCOUNTER — Ambulatory Visit: Payer: BC Managed Care – PPO

## 2014-02-15 ENCOUNTER — Encounter: Payer: Self-pay | Admitting: Family Medicine

## 2014-02-15 VITALS — BP 126/78 | HR 89 | Ht 67.0 in | Wt 154.0 lb

## 2014-02-15 DIAGNOSIS — M25551 Pain in right hip: Secondary | ICD-10-CM | POA: Insufficient documentation

## 2014-02-15 DIAGNOSIS — S4991XA Unspecified injury of right shoulder and upper arm, initial encounter: Secondary | ICD-10-CM

## 2014-02-15 DIAGNOSIS — S4980XA Other specified injuries of shoulder and upper arm, unspecified arm, initial encounter: Secondary | ICD-10-CM

## 2014-02-15 DIAGNOSIS — S46909A Unspecified injury of unspecified muscle, fascia and tendon at shoulder and upper arm level, unspecified arm, initial encounter: Secondary | ICD-10-CM

## 2014-02-15 NOTE — Progress Notes (Signed)
Patient ID: [name], female   DOB: [birth year], 53 y.o.   MRN: [id number]  PCP: Kelton Pillar, MD  Subjective:   HPI: Patient is a 53 y.o. female here for right hip pain.  4/6: Patient reports on 4/5 she was at the beach with her 60 lb dog on a training leash. The dog saw a bird and chased it while her right hand was still around end of leash. Caused a traction injury with arm stretched in front of her in flexion and pulled her hard to ground onto left side. Laid on ground for several minutes until she could get up and got into a car, went to hospital. Was very somnolent, concern for intracranial hemorrhage so CT was done of head and also of neck which were negative. Right shoulder radiographs were negative.  No dislocation as well. Given zofran, morphine, toradol. Has been wearing a sling, taking percocet as needed for pain as well as ibuprofen. Denies prior injury.  4/14: Patient reports as her shoulder pain has improved she's noticed worsening right hip pain. Felt mostly at iliac crest. Very painful to touch and difficulty bearing weight. No groin pain. No numbness/tingling. No radiation down leg. Has not had radiographs of hip.  Past Medical History  Diagnosis Date  . Depression   . Arthritis     right knee  . Fibromyalgia   . Degenerative disc disease   . Fibromyalgia   . Plantar fasciitis of left foot     Current Outpatient Prescriptions on File Prior to Visit  Medication Sig Dispense Refill  . b complex vitamins tablet Take 1 tablet by mouth daily.      Marland Kitchen buPROPion (WELLBUTRIN XL) 300 MG 24 hr tablet Take 1 tablet (300 mg total) by mouth daily.  30 tablet  1  . Calcium Carbonate-Vitamin D (CALCIUM 500 + D) 500-125 MG-UNIT TABS Take 1 tablet by mouth daily.      . Digestive Enzyme CAPS Take 1 capsule by mouth daily.      Marland Kitchen estradiol (ESTRACE) 1 MG tablet Take 1 tablet (1 mg total) by mouth daily.  30 tablet  3  . ferrous sulfate 325 (65 FE) MG tablet Take  325 mg by mouth 3 (three) times a week.       . fish oil-omega-3 fatty acids 1000 MG capsule Take 1 g by mouth daily.      Marland Kitchen FLORA-Q (FLORA-Q) CAPS capsule Take 1 capsule by mouth daily.      Marland Kitchen glucosamine-chondroitin 500-400 MG tablet Take 3 tablets by mouth daily.      . hyoscyamine (LEVBID) 0.375 MG 12 hr tablet Take 1 tablet (0.375 mg total) by mouth 2 (two) times daily.  60 tablet  11  . Multiple Vitamin (MULITIVITAMIN WITH MINERALS) TABS Take 1 tablet by mouth daily.      Marland Kitchen oxyCODONE-acetaminophen (PERCOCET/ROXICET) 5-325 MG per tablet Take 1 tablet by mouth every 6 (six) hours as needed for severe pain.  60 tablet  0  . progesterone (PROMETRIUM) 100 MG capsule Take 1 capsule (100 mg total) by mouth daily.  30 capsule  6  . venlafaxine XR (EFFEXOR XR) 37.5 MG 24 hr capsule Take 1 capsule (37.5 mg total) by mouth daily.  90 capsule  2   No current facility-administered medications on file prior to visit.    Past Surgical History  Procedure Laterality Date  . Knee arthroscopy    . Tonsillectomy    . Laproscopy    . Knee  arthroscopy      Allergies  Allergen Reactions  . Dulcolax [Bisacodyl]     Dehydration   . Penicillins Hives  . Tape Dermatitis  . Zithromax [Azithromycin] Rash    History   Social History  . Marital Status: Married    Spouse Name: N/A    Number of Children: N/A  . Years of Education: N/A   Occupational History  . RN    Social History Main Topics  . Smoking status: Never Smoker   . Smokeless tobacco: Never Used  . Alcohol Use: No  . Drug Use: No  . Sexual Activity: Yes    Birth Control/ Protection: Post-menopausal   Other Topics Concern  . Not on file   Social History Narrative  . No narrative on file    Family History  Problem Relation Age of Onset  . Colon cancer Father   . Esophageal cancer Father   . Cancer Father   . Stroke Father   . Heart disease Father   . Hypertension Father   . Hyperlipidemia Father   . Heart disease  Mother   . Diabetes Mother   . Arthritis Mother   . Heart attack Mother   . Hypertension Sister   . Diabetes Maternal Grandfather     BP 127/79  Pulse 72  Ht 5\' 6"  (1.676 m)  Wt 154 lb (69.854 kg)  BMI 24.87 kg/m2  LMP 10/28/2008  Review of Systems: See HPI above.    Objective:  Physical Exam:  Gen: NAD  Right hip/back: No gross deformity, scoliosis. TTP right iliac crest laterally and ASIS.  No midline or bony TTP. FROM back and hip without pain. Strength LEs 5/5 all muscle groups.   2+ MSRs in patellar and achilles tendons, equal bilaterally. Negative SLRs. Sensation intact to light touch bilaterally. Negative logroll bilateral hips Negative fabers and piriformis stretches.    Assessment & Plan:  1. Right hip pain - Radiographs negative for fracture.  Consistent with hip pointer/contusion.  Reassured patient.  Icing, ibuprofen.  May take up to 6-8 weeks to resolve.  Activities as tolerated.

## 2014-02-15 NOTE — Assessment & Plan Note (Signed)
Radiographs negative for fracture.  Consistent with hip pointer/contusion.  Reassured patient.  Icing, ibuprofen.  May take up to 6-8 weeks to resolve.  Activities as tolerated.

## 2014-02-15 NOTE — Patient Instructions (Signed)
Continue with physical therapy - as you improve you typically will go fewer times per week and transition to a home program only. Ibuprofen as needed as you have been. Heat or ice 15 minutes at a time as needed. Follow up with me in 4 weeks. See letter for restrictions to start 1 week from now - we will revisit this at your appointment in 4 weeks.

## 2014-02-16 ENCOUNTER — Ambulatory Visit: Payer: BC Managed Care – PPO | Admitting: Physical Therapy

## 2014-02-17 ENCOUNTER — Encounter: Payer: Self-pay | Admitting: *Deleted

## 2014-02-17 ENCOUNTER — Encounter: Payer: Self-pay | Admitting: Family Medicine

## 2014-02-17 NOTE — Progress Notes (Signed)
Patient ID: [name], female   DOB: 08/27/1961, 53 y.o.   MRN: [id number]  PCP: Kelton Pillar, MD  Subjective:   HPI: Patient is a 53 y.o. female here for right shoulder injury.  4/6: Patient reports on 4/5 she was at the beach with her 60 lb dog on a training leash. The dog saw a bird and chased it while her right hand was still around end of leash. Caused a traction injury with arm stretched in front of her in flexion and pulled her hard to ground onto left side. Laid on ground for several minutes until she could get up and got into a car, went to hospital. Was very somnolent, concern for intracranial hemorrhage so CT was done of head and also of neck which were negative. Right shoulder radiographs were negative.  No dislocation as well. Given zofran, morphine, toradol. Has been wearing a sling, taking percocet as needed for pain as well as ibuprofen. Denies prior injury.  4/21: Patient reports shoulder is at least 50% better. Doing physical therapy last 2 weeks. Takes ibuprofen 800 to help her sleep. Icing and heating. Going without sling past few days but shoulder does fatigue easily.  Past Medical History  Diagnosis Date  . Depression   . Arthritis     right knee  . Fibromyalgia   . Degenerative disc disease   . Fibromyalgia   . Plantar fasciitis of left foot     Current Outpatient Prescriptions on File Prior to Visit  Medication Sig Dispense Refill  . b complex vitamins tablet Take 1 tablet by mouth daily.      Marland Kitchen buPROPion (WELLBUTRIN XL) 300 MG 24 hr tablet Take 1 tablet (300 mg total) by mouth daily.  30 tablet  1  . Calcium Carbonate-Vitamin D (CALCIUM 500 + D) 500-125 MG-UNIT TABS Take 1 tablet by mouth daily.      . Digestive Enzyme CAPS Take 1 capsule by mouth daily.      Marland Kitchen estradiol (ESTRACE) 1 MG tablet Take 1 tablet (1 mg total) by mouth daily.  30 tablet  3  . ferrous sulfate 325 (65 FE) MG tablet Take 325 mg by mouth 3 (three) times a week.        . fish oil-omega-3 fatty acids 1000 MG capsule Take 1 g by mouth daily.      Marland Kitchen FLORA-Q (FLORA-Q) CAPS capsule Take 1 capsule by mouth daily.      Marland Kitchen glucosamine-chondroitin 500-400 MG tablet Take 3 tablets by mouth daily.      . hyoscyamine (LEVBID) 0.375 MG 12 hr tablet Take 1 tablet (0.375 mg total) by mouth 2 (two) times daily.  60 tablet  11  . Multiple Vitamin (MULITIVITAMIN WITH MINERALS) TABS Take 1 tablet by mouth daily.      Marland Kitchen oxyCODONE-acetaminophen (PERCOCET/ROXICET) 5-325 MG per tablet Take 1 tablet by mouth every 6 (six) hours as needed for severe pain.  60 tablet  0  . progesterone (PROMETRIUM) 100 MG capsule Take 1 capsule (100 mg total) by mouth daily.  30 capsule  6  . venlafaxine XR (EFFEXOR XR) 37.5 MG 24 hr capsule Take 1 capsule (37.5 mg total) by mouth daily.  90 capsule  2   No current facility-administered medications on file prior to visit.    Past Surgical History  Procedure Laterality Date  . Knee arthroscopy    . Tonsillectomy    . Laproscopy    . Knee arthroscopy      Allergies  Allergen Reactions  . Dulcolax [Bisacodyl]     Dehydration   . Penicillins Hives  . Tape Dermatitis  . Zithromax [Azithromycin] Rash    History   Social History  . Marital Status: Married    Spouse Name: N/A    Number of Children: N/A  . Years of Education: N/A   Occupational History  . RN    Social History Main Topics  . Smoking status: Never Smoker   . Smokeless tobacco: Never Used  . Alcohol Use: No  . Drug Use: No  . Sexual Activity: Yes    Birth Control/ Protection: Post-menopausal   Other Topics Concern  . Not on file   Social History Narrative  . No narrative on file    Family History  Problem Relation Age of Onset  . Colon cancer Father   . Esophageal cancer Father   . Cancer Father   . Stroke Father   . Heart disease Father   . Hypertension Father   . Hyperlipidemia Father   . Heart disease Mother   . Diabetes Mother   . Arthritis Mother    . Heart attack Mother   . Hypertension Sister   . Diabetes Maternal Grandfather     BP 126/78  Pulse 89  Ht 5\' 7"  (1.702 m)  Wt 154 lb (69.854 kg)  BMI 24.11 kg/m2  LMP 10/28/2008  Review of Systems: See HPI above.    Objective:  Physical Exam:  Gen: NAD  Right shoulder: No swelling, ecchymoses.  No gross deformity. Mild anterior to posterior tenderness.  Tender mainly lateral trapezius. FROM now with painful arc - significant improvement. Pain with resisted empty can and ER with 5-/5 strength.  Full strength IR without pain. Positive Hawkins, Neers mildly. Negative Yergasons. Negative apprehension. NV intact distally.    Assessment & Plan:  1. Right shoulder injury - MRI showed only bursitis, tendinopathy and lateral trapezius tear.  Significant improvement with physical therapy past 2 weeks.  Continue with this and home exercise program.  Ibuprofen as needed.  Heat or ice as needed.  Restart work with restrictions in 1 week - f/u in 4 weeks and hopefully can release to full duty then.

## 2014-02-17 NOTE — Assessment & Plan Note (Signed)
MRI showed only bursitis, tendinopathy and lateral trapezius tear.  Significant improvement with physical therapy past 2 weeks.  Continue with this and home exercise program.  Ibuprofen as needed.  Heat or ice as needed.  Restart work with restrictions in 1 week - f/u in 4 weeks and hopefully can release to full duty then.

## 2014-02-18 ENCOUNTER — Ambulatory Visit: Payer: BC Managed Care – PPO | Admitting: Physical Therapy

## 2014-02-21 ENCOUNTER — Ambulatory Visit: Payer: BC Managed Care – PPO | Admitting: Physical Therapy

## 2014-02-22 ENCOUNTER — Ambulatory Visit: Payer: BC Managed Care – PPO

## 2014-02-24 ENCOUNTER — Ambulatory Visit: Payer: BC Managed Care – PPO

## 2014-02-28 ENCOUNTER — Ambulatory Visit: Payer: BC Managed Care – PPO | Attending: Family Medicine | Admitting: Physical Therapy

## 2014-02-28 DIAGNOSIS — IMO0001 Reserved for inherently not codable concepts without codable children: Secondary | ICD-10-CM | POA: Diagnosis not present

## 2014-02-28 DIAGNOSIS — M25519 Pain in unspecified shoulder: Secondary | ICD-10-CM | POA: Insufficient documentation

## 2014-02-28 DIAGNOSIS — M25619 Stiffness of unspecified shoulder, not elsewhere classified: Secondary | ICD-10-CM | POA: Insufficient documentation

## 2014-02-28 DIAGNOSIS — R5381 Other malaise: Secondary | ICD-10-CM | POA: Insufficient documentation

## 2014-03-01 ENCOUNTER — Ambulatory Visit: Payer: BC Managed Care – PPO

## 2014-03-01 DIAGNOSIS — IMO0001 Reserved for inherently not codable concepts without codable children: Secondary | ICD-10-CM | POA: Diagnosis not present

## 2014-03-03 ENCOUNTER — Other Ambulatory Visit: Payer: Self-pay | Admitting: *Deleted

## 2014-03-03 ENCOUNTER — Telehealth: Payer: Self-pay | Admitting: Family Medicine

## 2014-03-03 ENCOUNTER — Ambulatory Visit: Payer: BC Managed Care – PPO

## 2014-03-03 DIAGNOSIS — IMO0001 Reserved for inherently not codable concepts without codable children: Secondary | ICD-10-CM | POA: Diagnosis not present

## 2014-03-03 MED ORDER — DICLOFENAC SODIUM 75 MG PO TBEC: DELAYED_RELEASE_TABLET | ORAL | Status: DC

## 2014-03-03 NOTE — Telephone Encounter (Signed)
Ok to call in voltaren 75mg  twice a day with food.  #60, 1 refill.  Thanks!

## 2014-03-07 ENCOUNTER — Ambulatory Visit: Payer: BC Managed Care – PPO

## 2014-03-07 DIAGNOSIS — IMO0001 Reserved for inherently not codable concepts without codable children: Secondary | ICD-10-CM | POA: Diagnosis not present

## 2014-03-08 ENCOUNTER — Ambulatory Visit: Payer: BC Managed Care – PPO

## 2014-03-08 DIAGNOSIS — IMO0001 Reserved for inherently not codable concepts without codable children: Secondary | ICD-10-CM | POA: Diagnosis not present

## 2014-03-10 ENCOUNTER — Ambulatory Visit: Payer: BC Managed Care – PPO | Admitting: Physical Therapy

## 2014-03-10 ENCOUNTER — Ambulatory Visit (INDEPENDENT_AMBULATORY_CARE_PROVIDER_SITE_OTHER): Payer: BC Managed Care – PPO | Admitting: Family Medicine

## 2014-03-10 ENCOUNTER — Encounter: Payer: Self-pay | Admitting: Family Medicine

## 2014-03-10 VITALS — BP 126/82 | HR 83 | Ht 67.0 in | Wt 159.0 lb

## 2014-03-10 DIAGNOSIS — M25551 Pain in right hip: Secondary | ICD-10-CM

## 2014-03-10 DIAGNOSIS — S46909A Unspecified injury of unspecified muscle, fascia and tendon at shoulder and upper arm level, unspecified arm, initial encounter: Secondary | ICD-10-CM

## 2014-03-10 DIAGNOSIS — M25559 Pain in unspecified hip: Secondary | ICD-10-CM

## 2014-03-10 DIAGNOSIS — IMO0001 Reserved for inherently not codable concepts without codable children: Secondary | ICD-10-CM | POA: Diagnosis not present

## 2014-03-10 DIAGNOSIS — S4980XA Other specified injuries of shoulder and upper arm, unspecified arm, initial encounter: Secondary | ICD-10-CM

## 2014-03-10 DIAGNOSIS — S4991XA Unspecified injury of right shoulder and upper arm, initial encounter: Secondary | ICD-10-CM

## 2014-03-10 NOTE — Patient Instructions (Signed)
Continue with physical therapy, fascial release as you have been. Home exercises regularly for your hip also. Voltaren as needed (ok to take regularly). Light duty for 2 weeks then released to full duty. Heat or ice 15 minutes at a time as needed. Follow up with me in 6 weeks.

## 2014-03-14 ENCOUNTER — Encounter: Payer: Self-pay | Admitting: Family Medicine

## 2014-03-14 ENCOUNTER — Encounter: Payer: BC Managed Care – PPO | Admitting: Physical Therapy
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
## 2014-03-14 NOTE — Assessment & Plan Note (Signed)
2/2 hip pointer.  Continue with home exercises, fascial release.  Voltaren, heat/ice.  Light duty 2 weeks then back to full duty.  F/u in 6 weeks.

## 2014-03-14 NOTE — Assessment & Plan Note (Signed)
MRI showed only bursitis, tendinopathy and lateral trapezius tear.  Significant improvement with physical therapy.  Continue PT and HEP.  Light duty for 2 weeks then return to full duty.  Voltaren, heat/ice.  F/u in 6 weeks.

## 2014-03-15 ENCOUNTER — Ambulatory Visit: Payer: BC Managed Care – PPO | Admitting: Family Medicine

## 2014-03-16 ENCOUNTER — Ambulatory Visit: Payer: BC Managed Care – PPO

## 2014-03-16 DIAGNOSIS — IMO0001 Reserved for inherently not codable concepts without codable children: Secondary | ICD-10-CM | POA: Diagnosis not present

## 2014-03-17 ENCOUNTER — Ambulatory Visit: Payer: BC Managed Care – PPO

## 2014-03-17 DIAGNOSIS — IMO0001 Reserved for inherently not codable concepts without codable children: Secondary | ICD-10-CM | POA: Diagnosis not present

## 2014-03-22 ENCOUNTER — Encounter: Payer: BC Managed Care – PPO | Admitting: Physical Therapy

## 2014-03-23 ENCOUNTER — Ambulatory Visit: Payer: BC Managed Care – PPO

## 2014-03-23 DIAGNOSIS — IMO0001 Reserved for inherently not codable concepts without codable children: Secondary | ICD-10-CM | POA: Diagnosis not present

## 2014-03-25 ENCOUNTER — Ambulatory Visit: Payer: BC Managed Care – PPO | Admitting: Physical Therapy

## 2014-03-25 DIAGNOSIS — IMO0001 Reserved for inherently not codable concepts without codable children: Secondary | ICD-10-CM | POA: Diagnosis not present

## 2014-03-30 ENCOUNTER — Ambulatory Visit: Payer: BC Managed Care – PPO | Attending: Family Medicine | Admitting: Physical Therapy

## 2014-03-30 DIAGNOSIS — M25519 Pain in unspecified shoulder: Secondary | ICD-10-CM | POA: Insufficient documentation

## 2014-03-30 DIAGNOSIS — M25619 Stiffness of unspecified shoulder, not elsewhere classified: Secondary | ICD-10-CM | POA: Insufficient documentation

## 2014-03-30 DIAGNOSIS — IMO0001 Reserved for inherently not codable concepts without codable children: Secondary | ICD-10-CM | POA: Insufficient documentation

## 2014-03-30 DIAGNOSIS — R5381 Other malaise: Secondary | ICD-10-CM | POA: Insufficient documentation

## 2014-04-01 ENCOUNTER — Ambulatory Visit: Payer: BC Managed Care – PPO | Admitting: Physical Therapy

## 2014-04-05 ENCOUNTER — Encounter: Payer: Self-pay | Admitting: Gastroenterology

## 2014-04-05 ENCOUNTER — Ambulatory Visit (INDEPENDENT_AMBULATORY_CARE_PROVIDER_SITE_OTHER): Payer: BC Managed Care – PPO | Admitting: Gastroenterology

## 2014-04-05 VITALS — BP 114/70 | HR 88 | Ht 66.14 in | Wt 165.0 lb

## 2014-04-05 DIAGNOSIS — K589 Irritable bowel syndrome without diarrhea: Secondary | ICD-10-CM

## 2014-04-05 NOTE — Progress Notes (Signed)
Review of pertinent gastrointestinal problems:  1. Elevated risk for colon cancer: Personal history of adenomatous colon polyps and FH of colon cancer: father had colon cancer in 50's, she had polyp May 2007 removed by Dr. Sammuel Cooper (unclear pathology), colonoscopy 10/2011 Dr. Ardis Hughs found small tubular adenoma, recall recommended at 5 year interval  2. Acute vomiting, diarrheal illness 08/2013; was at wedding, several people also very ill, 3 of them were proven to have Salmonella. Presumed she also had salmonella, was bothered with post infectious change in bowels.   HPI: This is a  very pleasant 53 year old woman whom I last saw her 4 months ago.  Significant improvement since even last visit.  Eating fiber more.  Can still have gas and bloating at times.  Back on salads, fruits, most veggies.  Off probiotic and other digestive enzymes.  Very little reflux.  Injured her shoulder 2 months ago, tore some muscles and required pain meds briefly.  Working through it with PT.  Drinking dairy again.  Spicy foods are still a problem, creates feeling of urgency.   Past Medical History  Diagnosis Date  . Depression   . Arthritis     right knee  . Fibromyalgia   . Degenerative disc disease   . Fibromyalgia   . Plantar fasciitis of left foot   . Salmonella poisoning   . IBS (irritable bowel syndrome)     Past Surgical History  Procedure Laterality Date  . Knee arthroscopy    . Tonsillectomy    . Laproscopy    . Knee arthroscopy      Current Outpatient Prescriptions  Medication Sig Dispense Refill  . b complex vitamins tablet Take 1 tablet by mouth daily.      Marland Kitchen buPROPion (WELLBUTRIN XL) 300 MG 24 hr tablet Take 1 tablet (300 mg total) by mouth daily.  30 tablet  1  . Calcium Carbonate-Vitamin D (CALCIUM 500 + D) 500-125 MG-UNIT TABS Take 1 tablet by mouth daily.      . diclofenac (VOLTAREN) 75 MG EC tablet Take one tablet by mouth twice a day with food.  60 tablet  1  .  estradiol (ESTRACE) 1 MG tablet Take 1 tablet (1 mg total) by mouth daily.  30 tablet  3  . ferrous sulfate 325 (65 FE) MG tablet Take 325 mg by mouth 3 (three) times a week.       . fish oil-omega-3 fatty acids 1000 MG capsule Take 1 g by mouth daily.      Marland Kitchen glucosamine-chondroitin 500-400 MG tablet Take 3 tablets by mouth daily.      . Multiple Vitamin (MULITIVITAMIN WITH MINERALS) TABS Take 1 tablet by mouth daily.      Marland Kitchen oxyCODONE-acetaminophen (PERCOCET/ROXICET) 5-325 MG per tablet Take 1 tablet by mouth every 6 (six) hours as needed for severe pain.  60 tablet  0  . progesterone (PROMETRIUM) 100 MG capsule Take 1 capsule (100 mg total) by mouth daily.  30 capsule  6  . venlafaxine XR (EFFEXOR XR) 37.5 MG 24 hr capsule Take 1 capsule (37.5 mg total) by mouth daily.  90 capsule  2   No current facility-administered medications for this visit.    Allergies as of 04/05/2014 - Review Complete 04/05/2014  Allergen Reaction Noted  . Dulcolax [bisacodyl]  11/06/2011  . Penicillins Hives 05/28/2012  . Tape Dermatitis 05/28/2012  . Zithromax [azithromycin] Rash 11/06/2011    Family History  Problem Relation Age of Onset  . Colon cancer Father   .  Esophageal cancer Father   . Cancer Father   . Stroke Father   . Heart disease Father   . Hypertension Father   . Hyperlipidemia Father   . Heart disease Mother   . Diabetes Mother   . Arthritis Mother   . Heart attack Mother   . Hypertension Sister   . Diabetes Maternal Grandfather     History   Social History  . Marital Status: Married    Spouse Name: N/A    Number of Children: N/A  . Years of Education: N/A   Occupational History  . RN    Social History Main Topics  . Smoking status: Never Smoker   . Smokeless tobacco: Never Used  . Alcohol Use: No  . Drug Use: No  . Sexual Activity: Yes    Birth Control/ Protection: Post-menopausal   Other Topics Concern  . Not on file   Social History Narrative  . 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 She is eating salads, fruits and vegetables. She still has some issues with particular foods such as spicy foods, Peas.  She is eating dairy again. She understands that this is a common phenomenon after serious bowel injury such as what she suffered during her Salmonella gastroenteritis. She will call here if she has any further questions or concerns,.

## 2014-04-05 NOTE — Patient Instructions (Signed)
Continue slowly introducing new foods.   Keep gas ex around. Call Dr. Ardis Hughs as needed.

## 2014-04-06 ENCOUNTER — Ambulatory Visit: Payer: BC Managed Care – PPO | Admitting: Physical Therapy

## 2014-04-08 ENCOUNTER — Ambulatory Visit: Payer: BC Managed Care – PPO | Admitting: Physical Therapy

## 2014-04-21 ENCOUNTER — Ambulatory Visit (INDEPENDENT_AMBULATORY_CARE_PROVIDER_SITE_OTHER): Payer: BC Managed Care – PPO | Admitting: Family Medicine

## 2014-04-21 ENCOUNTER — Encounter: Payer: Self-pay | Admitting: Family Medicine

## 2014-04-21 VITALS — BP 118/80 | HR 89 | Ht 66.0 in | Wt 157.3 lb

## 2014-04-21 DIAGNOSIS — Z5189 Encounter for other specified aftercare: Secondary | ICD-10-CM

## 2014-04-21 DIAGNOSIS — S4991XD Unspecified injury of right shoulder and upper arm, subsequent encounter: Secondary | ICD-10-CM

## 2014-04-21 DIAGNOSIS — Q619 Cystic kidney disease, unspecified: Secondary | ICD-10-CM

## 2014-04-21 DIAGNOSIS — S46909A Unspecified injury of unspecified muscle, fascia and tendon at shoulder and upper arm level, unspecified arm, initial encounter: Secondary | ICD-10-CM

## 2014-04-21 DIAGNOSIS — N281 Cyst of kidney, acquired: Secondary | ICD-10-CM

## 2014-04-21 DIAGNOSIS — S4980XA Other specified injuries of shoulder and upper arm, unspecified arm, initial encounter: Secondary | ICD-10-CM

## 2014-04-25 ENCOUNTER — Other Ambulatory Visit: Payer: Self-pay | Admitting: *Deleted

## 2014-04-25 ENCOUNTER — Encounter: Payer: Self-pay | Admitting: Family Medicine

## 2014-04-25 NOTE — Assessment & Plan Note (Signed)
2/2 hip pointer.  Continue with home exercises, fascial release.  F/u prn.
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
## 2014-04-25 NOTE — Assessment & Plan Note (Signed)
MRI showed only bursitis, tendinopathy and lateral trapezius tear.  Much improved with occasional stiffness.  Will discharge at this time - continue working with her trainer.  F/u prn.

## 2014-04-25 NOTE — Telephone Encounter (Signed)
Refill req Progesterone

## 2014-04-26 MED ORDER — PROGESTERONE MICRONIZED 100 MG PO CAPS: 100.0000 mg | ORAL_CAPSULE | Freq: Every day | ORAL | Status: DC

## 2014-04-26 NOTE — Telephone Encounter (Signed)
Called pt to find out if MM was done before we refilled Progesterone. LMOM - need to know MM status before refill.

## 2014-04-26 NOTE — Telephone Encounter (Signed)
Megan Combs    Call pt and let her know that I only gave her 30  pills of progesterone as I do not have a yearly mammogram reports.  Did she get her mammogram done?  If so I need report or advise her that I will not refill any more HT until it is done  Message back with response

## 2014-05-02 ENCOUNTER — Other Ambulatory Visit: Payer: Self-pay | Admitting: *Deleted

## 2014-05-02 DIAGNOSIS — N951 Menopausal and female climacteric states: Secondary | ICD-10-CM

## 2014-05-02 NOTE — Telephone Encounter (Signed)
Incoming fax from [facility] requesting Estradiol 1 mg   Last AEX 05/14/13 Last refill 12/31/13 #30/ 3 refills No future appt scheduled.  MMG: 12/24/2012 [id number]  Please approve or deny rx.

## 2014-05-03 ENCOUNTER — Telehealth: Payer: Self-pay | Admitting: Gynecology

## 2014-05-03 MED ORDER — ESTRADIOL 1 MG PO TABS: 1.0000 mg | ORAL_TABLET | Freq: Every day | ORAL | Status: DC

## 2014-05-03 NOTE — Telephone Encounter (Signed)
Pt wants to discuss her medication with Dr. Charlies Constable she feels as though it is not working

## 2014-05-03 NOTE — Telephone Encounter (Signed)
Spoke with patient. Patient states that she is currently on Effexor for hot flashes and it is causing her "significant sexual side effects." Patient would like to come in to discuss alternatives with Dr.Lathrop. Appointment scheduled for next Wednesday 7/15 at 9:30am. Patient agreeable to date and time.  Routing to provider for final review. Patient agreeable to disposition. Will close encounter

## 2014-05-11 ENCOUNTER — Ambulatory Visit (INDEPENDENT_AMBULATORY_CARE_PROVIDER_SITE_OTHER): Payer: BC Managed Care – PPO | Admitting: Gynecology

## 2014-05-11 VITALS — BP 126/76 | HR 64 | Resp 12 | Ht 66.0 in | Wt 161.0 lb

## 2014-05-11 DIAGNOSIS — N951 Menopausal and female climacteric states: Secondary | ICD-10-CM

## 2014-05-11 NOTE — Patient Instructions (Signed)
luteva for vaginal dryness Decrease effexor to every other day for 10d then stop Watch for anorgasmia over the next 72m

## 2014-05-11 NOTE — Progress Notes (Signed)
Pt here to discuss her medications.  She is doing well on the effexor and estrogen and prometrium from a menopausal perspective, no hot flashes or night sweats but states that she has noticed issues with anorgasmia.  There is no dyspareunia but some dryness for which she is using EVOO. Pt is interested in perhaps stopping her effexor to see if that is the cause. We discussed that she is at the lowest dose although she may have a slight increase in seratonin from the effects of wellbutrin but should be safe to stop, she can alternate days for 10d and then stop. She should see affects quickly after stopping. Pt concerned if hot flashes return, we can increase estrogen if needed She denies bleeding on estrogen/progestin combination. She states that she had a bad case of IBS related to salmonella poisoning in November that is affected aborption of foods and wonders if this can be related, but denies dysparunea. If she wants, she can try the patch placebo that we sent her earlier and consider changing to non-oral medciation. Questions addressed Reminded overdue for both annual and mammogram, 3D recommended 12m spent counselin, >50% face to face

## 2014-05-18 ENCOUNTER — Ambulatory Visit (INDEPENDENT_AMBULATORY_CARE_PROVIDER_SITE_OTHER): Payer: BC Managed Care – PPO | Admitting: Internal Medicine

## 2014-05-18 ENCOUNTER — Encounter: Payer: Self-pay | Admitting: Internal Medicine

## 2014-05-18 ENCOUNTER — Other Ambulatory Visit: Payer: Self-pay | Admitting: Internal Medicine

## 2014-05-18 VITALS — BP 128/73 | HR 76 | Temp 97.5°F | Resp 17 | Ht 66.0 in | Wt 160.0 lb

## 2014-05-18 DIAGNOSIS — R197 Diarrhea, unspecified: Secondary | ICD-10-CM

## 2014-05-18 DIAGNOSIS — Z1231 Encounter for screening mammogram for malignant neoplasm of breast: Secondary | ICD-10-CM

## 2014-05-18 DIAGNOSIS — R110 Nausea: Secondary | ICD-10-CM

## 2014-05-18 LAB — COMPREHENSIVE METABOLIC PANEL
ALK PHOS: 110 U/L (ref 39–117)
ALT: 13 U/L (ref 0–35)
AST: 28 U/L (ref 0–37)
Albumin: 4.2 g/dL (ref 3.5–5.2)
BILIRUBIN TOTAL: 0.3 mg/dL (ref 0.2–1.2)
BUN: 19 mg/dL (ref 6–23)
CO2: 24 meq/L (ref 19–32)
CREATININE: 0.76 mg/dL (ref 0.50–1.10)
Calcium: 9.3 mg/dL (ref 8.4–10.5)
Chloride: 106 mEq/L (ref 96–112)
Glucose, Bld: 79 mg/dL (ref 70–99)
Potassium: 4.2 mEq/L (ref 3.5–5.3)
SODIUM: 138 meq/L (ref 135–145)
TOTAL PROTEIN: 7.3 g/dL (ref 6.0–8.3)

## 2014-05-18 LAB — CBC WITH DIFFERENTIAL/PLATELET
BASOS ABS: 0 10*3/uL (ref 0.0–0.1)
BASOS PCT: 0 % (ref 0–1)
EOS ABS: 0.2 10*3/uL (ref 0.0–0.7)
EOS PCT: 2 % (ref 0–5)
HCT: 39.9 % (ref 36.0–46.0)
Hemoglobin: 13.7 g/dL (ref 12.0–15.0)
Lymphocytes Relative: 37 % (ref 12–46)
Lymphs Abs: 3 10*3/uL (ref 0.7–4.0)
MCH: 30.5 pg (ref 26.0–34.0)
MCHC: 34.3 g/dL (ref 30.0–36.0)
MCV: 88.9 fL (ref 78.0–100.0)
Monocytes Absolute: 0.5 10*3/uL (ref 0.1–1.0)
Monocytes Relative: 6 % (ref 3–12)
NEUTROS PCT: 55 % (ref 43–77)
Neutro Abs: 4.5 10*3/uL (ref 1.7–7.7)
PLATELETS: 211 10*3/uL (ref 150–400)
RBC: 4.49 MIL/uL (ref 3.87–5.11)
RDW: 13 % (ref 11.5–15.5)
WBC: 8.1 10*3/uL (ref 4.0–10.5)

## 2014-05-18 LAB — AMYLASE: AMYLASE: 60 U/L (ref 0–105)
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
## 2014-05-19 ENCOUNTER — Encounter: Payer: Self-pay | Admitting: Internal Medicine

## 2014-05-24 ENCOUNTER — Ambulatory Visit: Admission: RE | Admit: 2014-05-24 | Payer: BC Managed Care – PPO | Source: Ambulatory Visit

## 2014-05-24 ENCOUNTER — Other Ambulatory Visit: Payer: Self-pay

## 2014-05-24 ENCOUNTER — Ambulatory Visit
Admission: RE | Admit: 2014-05-24 | Discharge: 2014-05-24 | Disposition: A | Discharge status: Home / Self Care | Payer: BC Managed Care – PPO | Source: Ambulatory Visit

## 2014-05-24 DIAGNOSIS — Z1231 Encounter for screening mammogram for malignant neoplasm of breast: Secondary | ICD-10-CM

## 2014-06-07 ENCOUNTER — Other Ambulatory Visit: Payer: Self-pay | Admitting: *Deleted

## 2014-06-07 MED ORDER — PROGESTERONE MICRONIZED 100 MG PO CAPS: 100.0000 mg | ORAL_CAPSULE | Freq: Every day | ORAL | Status: DC

## 2014-06-07 NOTE — Telephone Encounter (Signed)
Received fax from [facility] requesting refill on 100 mg PROGESTERONE.

## 2014-06-16 ENCOUNTER — Encounter: Payer: Self-pay | Admitting: Family Medicine

## 2014-06-16 ENCOUNTER — Ambulatory Visit (INDEPENDENT_AMBULATORY_CARE_PROVIDER_SITE_OTHER): Payer: BC Managed Care – PPO | Admitting: Family Medicine

## 2014-06-16 VITALS — BP 117/76 | Ht 66.5 in | Wt 160.0 lb

## 2014-06-16 DIAGNOSIS — M5442 Lumbago with sciatica, left side: Secondary | ICD-10-CM

## 2014-06-16 DIAGNOSIS — M5430 Sciatica, unspecified side: Secondary | ICD-10-CM

## 2014-06-16 NOTE — Patient Instructions (Signed)
You have lumbar radiculopathy (a pinched nerve in your low back). A prednisone dose pack is the best option for immediate relief and may be prescribed. Day after finishing prednisone ok to restart ibuprofen with food for pain and inflammation. Oxycodone as needed for severe pain (no driving on this medicine). Stay as active as possible. Physical therapy has been shown to be helpful as well - would likely add this in 1-2 weeks after improvement from the prednisone dose pack you have. Strengthening of low back muscles, abdominal musculature are key for long term pain relief. I would need to see a copy of the MRI and report to let you know what I think - often you would have to sign a release with them for this.

## 2014-06-20 ENCOUNTER — Ambulatory Visit (INDEPENDENT_AMBULATORY_CARE_PROVIDER_SITE_OTHER): Payer: BC Managed Care – PPO | Admitting: Internal Medicine

## 2014-06-20 ENCOUNTER — Encounter: Payer: Self-pay | Admitting: Family Medicine

## 2014-06-20 ENCOUNTER — Encounter: Payer: Self-pay | Admitting: Internal Medicine

## 2014-06-20 VITALS — BP 115/54 | HR 69 | Resp 16 | Ht 66.0 in | Wt 163.0 lb

## 2014-06-20 DIAGNOSIS — M545 Low back pain, unspecified: Secondary | ICD-10-CM | POA: Insufficient documentation

## 2014-06-20 DIAGNOSIS — IMO0002 Reserved for concepts with insufficient information to code with codable children: Secondary | ICD-10-CM | POA: Diagnosis not present

## 2014-06-20 DIAGNOSIS — N951 Menopausal and female climacteric states: Secondary | ICD-10-CM

## 2014-06-20 DIAGNOSIS — M5416 Radiculopathy, lumbar region: Secondary | ICD-10-CM

## 2014-06-20 NOTE — Progress Notes (Signed)
Patient ID: Megan Combs, female   DOB: [birth year], 53 y.o.   MRN: [id number]  PCP: Kelton Pillar, MD  Subjective:   HPI: Patient is a 53 y.o. female here for low back pain.  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 Immediate pain in left side with associated numbness and tingling down left leg wrapping around to medial left foot. Similar to prior issues with disc bulges in back that had completely recovered prior to this recent incident. Tried ice, heat, ibuprofen. Went to [facility] and prescribed a medrol dose pack and MRI ordered as well. No bowel/bladder dysfunction.  Past Medical History  Diagnosis Date  . Depression   . Arthritis     right knee  . Fibromyalgia   . Degenerative disc disease   . Fibromyalgia   . Plantar fasciitis of left foot   . Salmonella poisoning   . IBS (irritable bowel syndrome)     Current Outpatient Prescriptions on File Prior to Visit  Medication Sig Dispense Refill  . b complex vitamins tablet Take 1 tablet by mouth daily.      Marland Kitchen buPROPion (WELLBUTRIN XL) 300 MG 24 hr tablet Take 1 tablet (300 mg total) by mouth daily.  30 tablet  1  . Calcium Carbonate-Vitamin D (CALCIUM 500 + D) 500-125 MG-UNIT TABS Take 1 tablet by mouth daily.      . diclofenac (VOLTAREN) 75 MG EC tablet Take one tablet by mouth twice a day with food.  60 tablet  1  . estradiol (ESTRACE) 1 MG tablet Take 1 tablet (1 mg total) by mouth daily.  30 tablet  3  . ferrous sulfate 325 (65 FE) MG tablet Take 325 mg by mouth 3 (three) times a week.       . fish oil-omega-3 fatty acids 1000 MG capsule Take 1 g by mouth daily.      Marland Kitchen glucosamine-chondroitin 500-400 MG tablet Take 3 tablets by mouth daily.      . Multiple Vitamin (MULITIVITAMIN WITH MINERALS) TABS Take 1 tablet by mouth daily.      . progesterone (PROMETRIUM) 100 MG capsule Take 1 capsule (100 mg  total) by mouth daily.  90 capsule  1  . venlafaxine XR (EFFEXOR XR) 37.5 MG 24 hr capsule Take 1 capsule (37.5 mg total) by mouth daily.  90 capsule  2   No current facility-administered medications on file prior to visit.    Past Surgical History  Procedure Laterality Date  . Knee arthroscopy    . Tonsillectomy    . Laproscopy    . Knee arthroscopy      Allergies  Allergen Reactions  . Dulcolax [Bisacodyl]     Dehydration   . Penicillins Hives  . Tape Dermatitis  . Zithromax [Azithromycin] Rash    History   Social History  . Marital Status: Married    Spouse Name: N/A    Number of Children: N/A  . Years of Education: N/A   Occupational History  . RN    Social History Main Topics  . Smoking status: Never Smoker   . Smokeless tobacco: Never Used  . Alcohol Use: No  . Drug Use: No  . Sexual Activity: Yes    Birth Control/ Protection: Post-menopausal   Other Topics Concern  . Not on file   Social History Narrative  . No narrative on file  Family History  Problem Relation Age of Onset  . Colon cancer Father   . Esophageal cancer Father   . Cancer Father   . Stroke Father   . Heart disease Father   . Hypertension Father   . Hyperlipidemia Father   . Heart disease Mother   . Diabetes Mother   . Arthritis Mother   . Heart attack Mother   . Hypertension Sister   . Diabetes Maternal Grandfather     BP 117/76  Ht 5' 6.5" (1.689 m)  Wt 160 lb (72.576 kg)  BMI 25.44 kg/m2  LMP 10/28/2008  Review of Systems: See HPI above.    Objective:  Physical Exam:  Gen: NAD  Back: No gross deformity, scoliosis. TTP left low lumbar paraspinal region.  No midline or bony TTP. FROM. Strength LEs 5/5 all muscle groups.   2+ MSRs in patellar and achilles tendons, equal bilaterally. Negative SLRs. Sensation diminished to light touch lateral left thigh, medial left lower leg. Negative logroll bilateral hips Negative fabers and piriformis stretches.     Assessment & Plan:  1. Low back pain - consistent with lumbar radiculopathy from her fall.  Agree with continuing prednisone.  Oxycodone as needed for pain.  Physical therapy if improving after 1-2 weeks.  Go ahead with MRI as well.  Call in 1-2 weeks to let me know how she's doing - advised I'd be happy to review the MRI and report with her as well.

## 2014-06-20 NOTE — Progress Notes (Signed)
Subjective:    Patient ID: Megan Combs, female    DOB: [birth year], 53 y.o.   MRN: [id number]  HPI Megan Combs is here for my opinion regarding medication   She was on Effexor, oral estradiol 1 mg with prometrium 100 mg daily for hot flushes  Given to her by her GYN.  She did not like the sexual SE profile on Effexor and tried to wean herself off by taking Effexor qod.  She was having dizziness and "funny feeling" .  Off Effexor now for 5-6 days  She began prednisone for her sciatica and this helped with withdrawal symptoms from Effexor.  She still has 6 days left of oral prednisone  She is having some flushing at night and want my opinion of options.  She has been on HT since 2013 and recent mm negative.    Allergies  Allergen Reactions  . Dulcolax [Bisacodyl]     Dehydration   . Penicillins Hives  . Tape Dermatitis  . Zithromax [Azithromycin] Rash   Past Medical History  Diagnosis Date  . Depression   . Arthritis     right knee  . Fibromyalgia   . Degenerative disc disease   . Fibromyalgia   . Plantar fasciitis of left foot   . Salmonella poisoning   . IBS (irritable bowel syndrome)    Past Surgical History  Procedure Laterality Date  . Knee arthroscopy    . Tonsillectomy    . Laproscopy    . Knee arthroscopy     History   Social History  . Marital Status: Married    Spouse Name: N/A    Number of Children: N/A  . Years of Education: N/A   Occupational History  . RN    Social History Main Topics  . Smoking status: Never Smoker   . Smokeless tobacco: Never Used  . Alcohol Use: No  . Drug Use: No  . Sexual Activity: Yes    Birth Control/ Protection: Post-menopausal   Other Topics Concern  . Not on file   Social History Narrative  . No narrative on file   Family History  Problem Relation Age of Onset  . Colon cancer Father   . Esophageal cancer Father   . Cancer Father   . Stroke Father   . Heart disease Father   . Hypertension Father   .  Hyperlipidemia Father   . Heart disease Mother   . Diabetes Mother   . Arthritis Mother   . Heart attack Mother   . Hypertension Sister   . Diabetes Maternal Grandfather    Patient Active Problem List   Diagnosis Date Noted  . Low back pain 06/20/2014  . Right hip pain 02/15/2014  . Right shoulder injury 02/01/2014  . Salmonella 10/27/2013  . Breast density 03/17/2013  . Renal cyst, left 01/06/2013  . Family history of colon cancer 01/03/2013  . Elevated alkaline phosphatase measurement 01/03/2013  . Articular cartilage disorder of knee 12/24/2012  . Plantar fasciitis 07/21/2012  . Anxiety 07/02/2012  . History of anemia 07/01/2012  . Depression 07/01/2012  . Menopause 07/01/2012  . Menopausal hot flushes 07/01/2012  . DJD (degenerative joint disease) 07/01/2012  . Other and unspecified hyperlipidemia 07/01/2012  . Vaginal dryness, menopausal 07/01/2012  . Urge incontinence of urine 07/01/2012  . Fibromyalgia   . Family history of malignant neoplasm 12/25/2011  . Personal history of colonic polyps 12/25/2011   Current Outpatient Prescriptions on File Prior to Visit  Medication Sig  Dispense Refill  . b complex vitamins tablet Take 1 tablet by mouth daily.      Marland Kitchen buPROPion (WELLBUTRIN XL) 300 MG 24 hr tablet Take 1 tablet (300 mg total) by mouth daily.  30 tablet  1  . Calcium Carbonate-Vitamin D (CALCIUM 500 + D) 500-125 MG-UNIT TABS Take 1 tablet by mouth daily.      . diclofenac (VOLTAREN) 75 MG EC tablet Take one tablet by mouth twice a day with food.  60 tablet  1  . estradiol (ESTRACE) 1 MG tablet Take 1 tablet (1 mg total) by mouth daily.  30 tablet  3  . ferrous sulfate 325 (65 FE) MG tablet Take 325 mg by mouth 3 (three) times a week.       . fish oil-omega-3 fatty acids 1000 MG capsule Take 1 g by mouth daily.      Marland Kitchen glucosamine-chondroitin 500-400 MG tablet Take 3 tablets by mouth daily.      . Multiple Vitamin (MULITIVITAMIN WITH MINERALS) TABS Take 1 tablet by  mouth daily.      . predniSONE (DELTASONE) 20 MG tablet       . progesterone (PROMETRIUM) 100 MG capsule Take 1 capsule (100 mg total) by mouth daily.  90 capsule  1  . venlafaxine XR (EFFEXOR XR) 37.5 MG 24 hr capsule Take 1 capsule (37.5 mg total) by mouth daily.  90 capsule  2   No current facility-administered medications on file prior to visit.       Review of Systems See HPI    Objective:   Physical Exam  Physical Exam  Nursing note and vitals reviewed.  Constitutional: She is oriented to person, place, and time. She appears well-developed and well-nourished.  HENT:  Head: Normocephalic and atraumatic.  Cardiovascular: Normal rate and regular rhythm. Exam reveals no gallop and no friction rub.  No murmur heard.  Pulmonary/Chest: Breath sounds normal. She has no wheezes. She has no rales.  Neurological: She is alert and oriented to person, place, and time.  Skin: Skin is warm and dry.  Psychiatric: She has a normal mood and affect. Her behavior is normal.         Assessment & Plan:  Menopausal hot flushes:  See Dr.  . Brion Aliment recent note.  I agree she could try slightly higher dose of oral estrogen if hot flushes worsening>  Pt intolerant of SSRi's and SNRI's.  She also does not like neurontin.    Only other option is low dose clonidine but her SBP tends to be on the low  Side.    I have not given any RX today  SSRI/SNRI intolerance  Effexor withdrawal symptoms better now that she is on prednisone  If returns when steroid D/C's she is to see me in office    Lumbar radiculopathy  Continue prednisone   See above

## 2014-06-20 NOTE — Assessment & Plan Note (Signed)
consistent with lumbar radiculopathy from her fall.  Agree with continuing prednisone.  Oxycodone as needed for pain.  Physical therapy if improving after 1-2 weeks.  Go ahead with MRI as well.  Call in 1-2 weeks to let me know how she's doing - advised I'd be happy to review the MRI and report with her as well.

## 2014-06-29 ENCOUNTER — Other Ambulatory Visit (INDEPENDENT_AMBULATORY_CARE_PROVIDER_SITE_OTHER): Payer: BC Managed Care – PPO

## 2014-06-29 ENCOUNTER — Encounter: Payer: Self-pay | Admitting: Cardiology

## 2014-06-29 ENCOUNTER — Ambulatory Visit (INDEPENDENT_AMBULATORY_CARE_PROVIDER_SITE_OTHER): Payer: BC Managed Care – PPO | Admitting: Cardiology

## 2014-06-29 VITALS — BP 126/72 | HR 95 | Ht 67.0 in | Wt 161.0 lb

## 2014-06-29 DIAGNOSIS — I493 Ventricular premature depolarization: Secondary | ICD-10-CM | POA: Insufficient documentation

## 2014-06-29 DIAGNOSIS — R350 Frequency of micturition: Secondary | ICD-10-CM | POA: Diagnosis not present

## 2014-06-29 DIAGNOSIS — E785 Hyperlipidemia, unspecified: Secondary | ICD-10-CM | POA: Insufficient documentation

## 2014-06-29 DIAGNOSIS — R002 Palpitations: Secondary | ICD-10-CM

## 2014-06-29 DIAGNOSIS — Z8249 Family history of ischemic heart disease and other diseases of the circulatory system: Secondary | ICD-10-CM | POA: Insufficient documentation

## 2014-06-29 MED ORDER — CIPROFLOXACIN HCL 500 MG PO TABS: 500.0000 mg | ORAL_TABLET | Freq: Two times a day (BID) | ORAL | Status: DC

## 2014-06-29 NOTE — Patient Instructions (Signed)
Your physician recommends that you continue on your current medications as directed. Please refer to the Current Medication list given to you today  Your physician recommends that you return for a FASTING lipid profile and ALT within the next week. Please schedule before leaving today  Your physician wants you to follow-up in: 1 year with Dr Mallie Snooks will receive a reminder letter in the mail two months in advance. If you don't receive a letter, please call our office to schedule the follow-up appointment.

## 2014-06-29 NOTE — Progress Notes (Signed)
[name], [name]  [address] Phone: [phone] Fax:  [phone]  Date:  06/29/2014   ID:  Megan Combs, DOB [birth year], MRN [id number]  PCP:  Kelton Pillar, MD  Cardiologist:  Fransico Him, MD     History of Present Illness: Megan Combs is a 53 y.o. female with a history of dyslipidemia, family history of CAD and palpitations with heart monitor showing PVC's.  She now presents today for followup.  She is doing well.  She denies any chest pain, SOB, DOE, LE edema, dizziness or syncope.  Since I saw her last she has had Salmonella and then developed associated IBS which has drastically affected her diet.  She was treated with steroids for a while and had some palpitations while on steroids which settled out.     Wt Readings from Last 3 Encounters:  06/29/14 161 lb (73.029 kg)  06/20/14 163 lb (73.936 kg)  06/16/14 160 lb (72.576 kg)     Past Medical History  Diagnosis Date  . Depression   . Arthritis     right knee  . Fibromyalgia   . Degenerative disc disease   . Fibromyalgia   . Plantar fasciitis of left foot   . Salmonella poisoning   . IBS (irritable bowel syndrome)   . Hyperlipidemia   . PVC (premature ventricular contraction)   . Family history of early CAD     Current Outpatient Prescriptions  Medication Sig Dispense Refill  . b complex vitamins tablet Take 1 tablet by mouth daily.      Marland Kitchen buPROPion (WELLBUTRIN XL) 300 MG 24 hr tablet Take 1 tablet (300 mg total) by mouth daily.  30 tablet  1  . Calcium Carbonate-Vitamin D (CALCIUM 500 + D) 500-125 MG-UNIT TABS Take 1 tablet by mouth daily.      . diclofenac (VOLTAREN) 75 MG EC tablet Take 75 mg by mouth as needed. Take one tablet by mouth twice a day with food.      Marland Kitchen estradiol (ESTRACE) 1 MG tablet Take 1 tablet (1 mg total) by mouth daily.  30 tablet  3  . ferrous sulfate 325 (65 FE) MG tablet Take 325 mg by mouth 3 (three) times a week.       . fish oil-omega-3 fatty acids 1000  MG capsule Take 1 g by mouth daily.      Marland Kitchen glucosamine-chondroitin 500-400 MG tablet Take 3 tablets by mouth daily.      . Magnesium 500 MG CAPS Take 1 capsule by mouth daily.      . Multiple Vitamin (MULITIVITAMIN WITH MINERALS) TABS Take 1 tablet by mouth daily.      . progesterone (PROMETRIUM) 100 MG capsule Take 1 capsule (100 mg total) by mouth daily.  90 capsule  1   No current facility-administered medications for this visit.    Allergies:    Allergies  Allergen Reactions  . Dulcolax [Bisacodyl]     Dehydration   . Effexor [Venlafaxine]     Intolerant SSRI/SNRI  . Penicillins Hives  . Tape Dermatitis  . Zithromax [Azithromycin] Rash    Social History:  The patient  reports that she has never smoked. She has never used smokeless tobacco. She reports that she does not drink alcohol or use illicit drugs.   Family History:  The patient's family history includes Arthritis in her mother; Cancer in her father; Colon cancer in her father; Diabetes in her maternal grandfather and mother; Esophageal cancer  in her father; Heart attack in her mother; Heart disease in her father and mother; Hyperlipidemia in her father; Hypertension in her father and sister; Stroke in her father.   ROS:  Please see the history of present illness.      All other systems reviewed and negative.   PHYSICAL EXAM: VS:  BP 126/72  Pulse 95  Ht 5\' 7"  (1.702 m)  Wt 161 lb (73.029 kg)  BMI 25.21 kg/m2  LMP 10/28/2008 Well nourished, well developed, in no acute distress HEENT: normal Neck: no JVD Cardiac:  normal S1, S2; RRR; no murmur Lungs:  clear to auscultation bilaterally, no wheezing, rhonchi or rales Abd: soft, nontender, no hepatomegaly Ext: no edema Skin: warm and dry Neuro:  CNs 2-12 intact, no focal abnormalities noted  EKG: NSR with rSR' in V1 and V2      ASSESSMENT AND PLAN:  1. Dyslipidemia - diet controlled.  I will recheck her lipids since her diet has changed tremendously since  developing infection related IBS. - recheck lipids 2. Family history of CAD 3. Palpitation with Holter showing PVC's 4. PVC's - asymptomatic  Followup with me in 1 year  Signed, Fransico Him, MD 06/29/2014 2:03 PM

## 2014-06-30 ENCOUNTER — Ambulatory Visit (INDEPENDENT_AMBULATORY_CARE_PROVIDER_SITE_OTHER): Payer: BC Managed Care – PPO | Admitting: Internal Medicine

## 2014-06-30 ENCOUNTER — Encounter: Payer: Self-pay | Admitting: Internal Medicine

## 2014-06-30 VITALS — BP 114/70 | HR 96 | Resp 16 | Ht 67.0 in | Wt 161.0 lb

## 2014-06-30 DIAGNOSIS — N951 Menopausal and female climacteric states: Secondary | ICD-10-CM | POA: Diagnosis not present

## 2014-06-30 DIAGNOSIS — N390 Urinary tract infection, site not specified: Secondary | ICD-10-CM

## 2014-06-30 DIAGNOSIS — Z789 Other specified health status: Secondary | ICD-10-CM

## 2014-06-30 NOTE — Patient Instructions (Addendum)
Try  I-cool for symptoms    See me as needed

## 2014-07-02 LAB — CULTURE, URINE COMPREHENSIVE: Colony Count: 80000

## 2014-07-04 DIAGNOSIS — Z789 Other specified health status: Secondary | ICD-10-CM | POA: Insufficient documentation

## 2014-07-04 NOTE — Progress Notes (Signed)
Subjective:    Patient ID: Megan Combs, female    DOB: [birth year], 53 y.o.   MRN: [id number]  HPI  Megan Combs is feeling much better after initiation of Cipro for her UTI.  Culture is pending  She is off her prednisone now and still feels  Some Dizziness with coming off Effexor, but this is improving as well.  She is very sensitive to [employer] and SNRI's   Still has some hot flushing on 1 mg of oral estradiol  By her GYN  Allergies  Allergen Reactions  . Dulcolax [Bisacodyl]     Dehydration   . Effexor [Venlafaxine]     Intolerant SSRI/SNRI  . Penicillins Hives  . Tape Dermatitis  . Zithromax [Azithromycin] Rash   Past Medical History  Diagnosis Date  . Depression   . Arthritis     right knee  . Fibromyalgia   . Degenerative disc disease   . Fibromyalgia   . Plantar fasciitis of left foot   . Salmonella poisoning   . IBS (irritable bowel syndrome)   . Hyperlipidemia   . PVC (premature ventricular contraction)   . Family history of early CAD    Past Surgical History  Procedure Laterality Date  . Knee arthroscopy    . Tonsillectomy    . Laproscopy    . Knee arthroscopy     History   Social History  . Marital Status: Married    Spouse Name: N/A    Number of Children: N/A  . Years of Education: N/A   Occupational History  . RN    Social History Main Topics  . Smoking status: Never Smoker   . Smokeless tobacco: Never Used  . Alcohol Use: No  . Drug Use: No  . Sexual Activity: Yes    Birth Control/ Protection: Post-menopausal   Other Topics Concern  . Not on file   Social History Narrative  . No narrative on file   Family History  Problem Relation Age of Onset  . Colon cancer Father   . Esophageal cancer Father   . Cancer Father   . Stroke Father   . Heart disease Father   . Hypertension Father   . Hyperlipidemia Father   . Heart disease Mother   . Diabetes Mother   . Arthritis Mother   . Heart attack Mother   . Hypertension Sister   .  Diabetes Maternal Grandfather    Patient Active Problem List   Diagnosis Date Noted  . Dyslipidemia 06/29/2014  . Family history of early CAD 06/29/2014  . Palpitations 06/29/2014  . Hyperlipidemia   . PVC (premature ventricular contraction)   . Low back pain 06/20/2014  . Right hip pain 02/15/2014  . Right shoulder injury 02/01/2014  . Salmonella 10/27/2013  . Breast density 03/17/2013  . Renal cyst, left 01/06/2013  . Family history of colon cancer 01/03/2013  . Elevated alkaline phosphatase measurement 01/03/2013  . Articular cartilage disorder of knee 12/24/2012  . Plantar fasciitis 07/21/2012  . Anxiety 07/02/2012  . History of anemia 07/01/2012  . Depression 07/01/2012  . Menopause 07/01/2012  . Menopausal hot flushes 07/01/2012  . DJD (degenerative joint disease) 07/01/2012  . Other and unspecified hyperlipidemia 07/01/2012  . Vaginal dryness, menopausal 07/01/2012  . Urge incontinence of urine 07/01/2012  . Fibromyalgia   . Family history of malignant neoplasm 12/25/2011  . Personal history of colonic polyps 12/25/2011   Current Outpatient Prescriptions on File Prior to Visit  Medication Sig Dispense  Refill  . b complex vitamins tablet Take 1 tablet by mouth daily.      Marland Kitchen buPROPion (WELLBUTRIN XL) 300 MG 24 hr tablet Take 1 tablet (300 mg total) by mouth daily.  30 tablet  1  . Calcium Carbonate-Vitamin D (CALCIUM 500 + D) 500-125 MG-UNIT TABS Take 1 tablet by mouth daily.      . ciprofloxacin (CIPRO) 500 MG tablet Take 1 tablet (500 mg total) by mouth 2 (two) times daily.  10 tablet  0  . diclofenac (VOLTAREN) 75 MG EC tablet Take 75 mg by mouth as needed. Take one tablet by mouth twice a day with food.      Marland Kitchen estradiol (ESTRACE) 1 MG tablet Take 1 tablet (1 mg total) by mouth daily.  30 tablet  3  . ferrous sulfate 325 (65 FE) MG tablet Take 325 mg by mouth 3 (three) times a week.       . fish oil-omega-3 fatty acids 1000 MG capsule Take 1 g by mouth daily.      Marland Kitchen  glucosamine-chondroitin 500-400 MG tablet Take 3 tablets by mouth daily.      . Magnesium 500 MG CAPS Take 1 capsule by mouth daily.      . Multiple Vitamin (MULITIVITAMIN WITH MINERALS) TABS Take 1 tablet by mouth daily.      . progesterone (PROMETRIUM) 100 MG capsule Take 1 capsule (100 mg total) by mouth daily.  90 capsule  1   No current facility-administered medications on file prior to visit.      Review of Systems See HPI    Objective:   Physical Exam Physical Exam  Nursing note and vitals reviewed.  Constitutional: She is oriented to person, place, and time. She appears well-developed and well-nourished.  HENT:  Head: Normocephalic and atraumatic.  Cardiovascular: Normal rate and regular rhythm. Exam reveals no gallop and no friction rub.  No murmur heard.  Pulmonary/Chest: Breath sounds normal. She has no wheezes. She has no rales.  Neurological: She is alert and oriented to person, place, and time.  Skin: Skin is warm and dry.  Psychiatric: She has a normal mood and affect. Her behavior is normal.              Assessment & Plan:  Sexual  SE of SSRI  / SNRI  Hopefully anorgasmia will improve when off meds  Menopausal hot flushes  Ok to try OTC genistein to see if this will help.  If not will leave to GYn discretion regarding dose change of estradiol  Recent  UTI  Culture pending

## 2014-07-05 ENCOUNTER — Telehealth: Payer: Self-pay | Admitting: Family Medicine

## 2014-07-05 NOTE — Telephone Encounter (Signed)
If she wants to go ahead with this, now would be the time (we discussed starting 1-2 weeks after our visit if she was feeling better).  Ok to put in referral if she wants to go to the [facility] location for low back pain.  Thanks!

## 2014-07-05 NOTE — Addendum Note (Signed)
Addended by: Sherrie George F on: 07/05/2014 04:07 PM   Modules accepted: Orders

## 2014-07-11 ENCOUNTER — Other Ambulatory Visit (INDEPENDENT_AMBULATORY_CARE_PROVIDER_SITE_OTHER): Payer: BC Managed Care – PPO

## 2014-07-11 ENCOUNTER — Ambulatory Visit: Payer: BC Managed Care – PPO | Attending: Family Medicine | Admitting: Physical Therapy

## 2014-07-11 DIAGNOSIS — R5381 Other malaise: Secondary | ICD-10-CM | POA: Insufficient documentation

## 2014-07-11 DIAGNOSIS — M25569 Pain in unspecified knee: Secondary | ICD-10-CM | POA: Diagnosis not present

## 2014-07-11 DIAGNOSIS — M545 Low back pain, unspecified: Secondary | ICD-10-CM | POA: Diagnosis not present

## 2014-07-11 DIAGNOSIS — IMO0001 Reserved for inherently not codable concepts without codable children: Secondary | ICD-10-CM | POA: Insufficient documentation

## 2014-07-11 DIAGNOSIS — E785 Hyperlipidemia, unspecified: Secondary | ICD-10-CM

## 2014-07-11 LAB — LIPID PANEL
CHOL/HDL RATIO: 3
Cholesterol: 185 mg/dL (ref 0–200)
HDL: 71.9 mg/dL (ref >39.00)
LDL Cholesterol: 99 mg/dL (ref 0–99)
NONHDL: 113.1
Triglycerides: 73 mg/dL (ref 0.0–149.0)
VLDL: 14.6 mg/dL (ref 0.0–40.0)

## 2014-07-11 LAB — ALT: ALT: 16 U/L (ref 0–35)

## 2014-07-12 ENCOUNTER — Ambulatory Visit: Payer: BC Managed Care – PPO | Admitting: Physical Therapy

## 2014-07-12 DIAGNOSIS — IMO0001 Reserved for inherently not codable concepts without codable children: Secondary | ICD-10-CM | POA: Diagnosis not present

## 2014-07-18 ENCOUNTER — Ambulatory Visit: Payer: BC Managed Care – PPO | Admitting: Physical Therapy

## 2014-07-18 DIAGNOSIS — IMO0001 Reserved for inherently not codable concepts without codable children: Secondary | ICD-10-CM | POA: Diagnosis not present

## 2014-07-20 ENCOUNTER — Ambulatory Visit: Payer: BC Managed Care – PPO | Admitting: Physical Therapy

## 2014-07-20 DIAGNOSIS — IMO0001 Reserved for inherently not codable concepts without codable children: Secondary | ICD-10-CM | POA: Diagnosis not present

## 2014-07-25 ENCOUNTER — Ambulatory Visit: Payer: BC Managed Care – PPO | Admitting: Physical Therapy

## 2014-07-25 DIAGNOSIS — IMO0001 Reserved for inherently not codable concepts without codable children: Secondary | ICD-10-CM | POA: Diagnosis not present

## 2014-07-27 ENCOUNTER — Ambulatory Visit: Payer: BC Managed Care – PPO | Admitting: Physical Therapy

## 2014-07-27 DIAGNOSIS — IMO0001 Reserved for inherently not codable concepts without codable children: Secondary | ICD-10-CM | POA: Diagnosis not present

## 2014-08-01 ENCOUNTER — Ambulatory Visit: Payer: BC Managed Care – PPO | Attending: Family Medicine | Admitting: Physical Therapy

## 2014-08-01 ENCOUNTER — Telehealth: Payer: Self-pay | Admitting: Gastroenterology

## 2014-08-01 DIAGNOSIS — Z5189 Encounter for other specified aftercare: Secondary | ICD-10-CM | POA: Diagnosis not present

## 2014-08-01 DIAGNOSIS — M797 Fibromyalgia: Secondary | ICD-10-CM | POA: Insufficient documentation

## 2014-08-01 DIAGNOSIS — R5381 Other malaise: Secondary | ICD-10-CM | POA: Diagnosis not present

## 2014-08-01 DIAGNOSIS — M545 Low back pain: Secondary | ICD-10-CM | POA: Diagnosis not present

## 2014-08-01 DIAGNOSIS — M79662 Pain in left lower leg: Secondary | ICD-10-CM | POA: Diagnosis not present

## 2014-08-01 NOTE — Telephone Encounter (Signed)
Pt passed a blood clot mixed with stool on Saturday. Has a family history of colon cancer.  Scheduled to see [name] for 08/02/14 130 pm

## 2014-08-02 ENCOUNTER — Ambulatory Visit (INDEPENDENT_AMBULATORY_CARE_PROVIDER_SITE_OTHER): Payer: BC Managed Care – PPO | Admitting: Physician Assistant

## 2014-08-02 ENCOUNTER — Other Ambulatory Visit (INDEPENDENT_AMBULATORY_CARE_PROVIDER_SITE_OTHER): Payer: BC Managed Care – PPO

## 2014-08-02 ENCOUNTER — Encounter: Payer: Self-pay | Admitting: Physician Assistant

## 2014-08-02 VITALS — BP 120/70 | HR 66 | Ht 67.0 in | Wt 165.2 lb

## 2014-08-02 DIAGNOSIS — R194 Change in bowel habit: Secondary | ICD-10-CM

## 2014-08-02 DIAGNOSIS — K625 Hemorrhage of anus and rectum: Secondary | ICD-10-CM

## 2014-08-02 DIAGNOSIS — Z800 Family history of malignant neoplasm of digestive organs: Secondary | ICD-10-CM

## 2014-08-02 DIAGNOSIS — R1032 Left lower quadrant pain: Secondary | ICD-10-CM

## 2014-08-02 LAB — CBC WITH DIFFERENTIAL/PLATELET
BASOS PCT: 0.6 % (ref 0.0–3.0)
Basophils Absolute: 0 10*3/uL (ref 0.0–0.1)
EOS PCT: 2.3 % (ref 0.0–5.0)
Eosinophils Absolute: 0.2 10*3/uL (ref 0.0–0.7)
HEMATOCRIT: 38.4 % (ref 36.0–46.0)
HEMOGLOBIN: 13.4 g/dL (ref 12.0–15.0)
LYMPHS ABS: 2.6 10*3/uL (ref 0.7–4.0)
LYMPHS PCT: 33.6 % (ref 12.0–46.0)
MCHC: 34.8 g/dL (ref 30.0–36.0)
MCV: 90.2 fl (ref 78.0–100.0)
MONOS PCT: 6.6 % (ref 3.0–12.0)
Monocytes Absolute: 0.5 10*3/uL (ref 0.1–1.0)
NEUTROS ABS: 4.5 10*3/uL (ref 1.4–7.7)
Neutrophils Relative %: 56.9 % (ref 43.0–77.0)
Platelets: 185 10*3/uL (ref 150.0–400.0)
RBC: 4.25 Mil/uL (ref 3.87–5.11)
RDW: 12.5 % (ref 11.5–15.5)
WBC: 7.9 10*3/uL (ref 4.0–10.5)

## 2014-08-02 LAB — SEDIMENTATION RATE: Sed Rate: 30 mm/hr — ABNORMAL HIGH (ref 0–22)

## 2014-08-02 LAB — HIGH SENSITIVITY CRP: CRP, High Sensitivity: 0.58 mg/L (ref 0.000–5.000)

## 2014-08-02 MED ORDER — MOVIPREP 100 G PO SOLR: 1.0000 | ORAL | Status: DC

## 2014-08-02 NOTE — Patient Instructions (Addendum)
Please go to the basement level to have your labs drawn.  You have been scheduled for a colonoscopy. Please follow written instructions given to you at your visit today.  Please pick up your prep kit at the pharmacy within the next 1-3 days. [facility] If you use inhalers (even only as needed), please bring them with you on the day of your procedure. Your physician has requested that you go to www.startemmi.com and enter the access code given to you at your visit today. This web site gives a general overview about your procedure. However, you should still follow specific instructions given to you by our office regarding your preparation for the procedure.

## 2014-08-02 NOTE — Progress Notes (Signed)
Subjective:    Patient ID: Megan Combs, female    DOB: 09-30-1961, 53 y.o.   MRN: [id number]  HPI  Megan Combs is a pleasant 53 year old white female known to Dr. Ardis Hughs. 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 She has family history of colon cancer in her father diagnosed at age 29, he is deceased from esophageal cancer at age 56. Patient had undergone colonoscopy in January of 2013 and was found to have one small tubular adenoma. She was felt to have had an infectious enterocolitis last fall from Salmonella and then developed post infectious IBS-type symptoms. She was last seen by Dr. Ardis Hughs in March of 2015 and was somewhat improved at that time but still having abdominal gas and bloating and looser stools. She comes in today after an acute episode this past weekend. She says she had a normal bowel movement and then about 1 hour later had some urge for bowel movement and passed a blood clot and mucous into the commode. She says this was not associated with any abdominal pain. She has not seen any blood since that time did have some mild abdominal cramping later in the day. No fever or chills. She has had normal bowel movement since but is very concerned about the passage of blood. She has no current hemorrhoidal symptoms rectal pain and discomfort etc. She states she's very worried about Crohn's disease because she hasn't felt right ever since she had the infectious colitis last fall. She says her symptoms have gradually worsened over the past 3 or 4 months with ongoing abdominal tenderness particularly on the left side gassiness abdominal bloating and tendency towards loose stools. She's not on any regular aspirin or NSAIDs. She did have an injury to her back towards the end of August and was put on a course of steroids and says that her GI symptoms improved significantly with a course of steroids.   Review of Systems  Constitutional: Negative.   HENT: Negative.   Respiratory: Negative.     Cardiovascular: Negative.   Gastrointestinal: Positive for abdominal pain and blood in stool.  Endocrine: Negative.   Genitourinary: Negative.   Musculoskeletal: Negative.   Skin: Negative.   Allergic/Immunologic: Negative.   Neurological: Negative.   Hematological: Negative.   Psychiatric/Behavioral: Negative.    Outpatient Prescriptions Prior to Visit  Medication Sig Dispense Refill  . b complex vitamins tablet Take 1 tablet by mouth daily.      Marland Kitchen buPROPion (WELLBUTRIN XL) 300 MG 24 hr tablet Take 1 tablet (300 mg total) by mouth daily.  30 tablet  1  . Calcium Carbonate-Vitamin D (CALCIUM 500 + D) 500-125 MG-UNIT TABS Take 1 tablet by mouth daily.      Marland Kitchen estradiol (ESTRACE) 1 MG tablet Take 1 tablet (1 mg total) by mouth daily.  30 tablet  3  . ferrous sulfate 325 (65 FE) MG tablet Take 325 mg by mouth 3 (three) times a week.       . fish oil-omega-3 fatty acids 1000 MG capsule Take 1 g by mouth daily.      Marland Kitchen glucosamine-chondroitin 500-400 MG tablet Take 3 tablets by mouth daily.      . Multiple Vitamin (MULITIVITAMIN WITH MINERALS) TABS Take 1 tablet by mouth daily.      . progesterone (PROMETRIUM) 100 MG capsule Take 1 capsule (100 mg total) by mouth daily.  90 capsule  1  . ciprofloxacin (CIPRO) 500 MG tablet Take 1 tablet (500 mg total) by mouth  2 (two) times daily.  10 tablet  0  . diclofenac (VOLTAREN) 75 MG EC tablet Take 75 mg by mouth as needed. Take one tablet by mouth twice a day with food.      . Magnesium 500 MG CAPS Take 1 capsule by mouth daily.      Marland Kitchen venlafaxine XR (EFFEXOR-XR) 37.5 MG 24 hr capsule        No facility-administered medications prior to visit.   Allergies  Allergen Reactions  . Dulcolax [Bisacodyl]     Dehydration   . Effexor [Venlafaxine]     Intolerant SSRI/SNRI  . Penicillins Hives  . Prednisone     Paranoid , anxious, hyper emotionally, could not sleep, odd dreams  . Tape Dermatitis  . Zithromax [Azithromycin] Rash   Patient Active  Problem List   Diagnosis Date Noted  . Medication intolerance 07/04/2014  . Dyslipidemia 06/29/2014  . Family history of early CAD 06/29/2014  . Palpitations 06/29/2014  . Hyperlipidemia   . PVC (premature ventricular contraction)   . Low back pain 06/20/2014  . Right hip pain 02/15/2014  . Right shoulder injury 02/01/2014  . Salmonella 10/27/2013  . Breast density 03/17/2013  . Renal cyst, left 01/06/2013  . Family history of colon cancer 01/03/2013  . Elevated alkaline phosphatase measurement 01/03/2013  . Articular cartilage disorder of knee 12/24/2012  . Plantar fasciitis 07/21/2012  . Anxiety 07/02/2012  . History of anemia 07/01/2012  . Depression 07/01/2012  . Menopause 07/01/2012  . Menopausal hot flushes 07/01/2012  . DJD (degenerative joint disease) 07/01/2012  . Other and unspecified hyperlipidemia 07/01/2012  . Vaginal dryness, menopausal 07/01/2012  . Urge incontinence of urine 07/01/2012  . Fibromyalgia   . Family history of malignant neoplasm 12/25/2011  . Personal history of colonic polyps 12/25/2011   History  Substance Use Topics  . Smoking status: Never Smoker   . Smokeless tobacco: Never Used  . Alcohol Use: No      family history includes Arthritis in her mother; Cancer in her father; Colon cancer in her father; Diabetes in her maternal grandfather and mother; Esophageal cancer in her father; Heart attack in her mother; Heart disease in her father and mother; Hyperlipidemia in her father; Hypertension in her father and sister; Stroke in her father.  Objective:   Physical Exam  well-developed white female in no acute distress, pleasant blood pressure 120/70 pulse 66 height 5 foot 7 weight 165. HEENT; nontraumatic normocephalic EOMI PERRLA sclera anicteric, Supple ;no JVD, Cardiovascular; regular rate and rhythm with S1-S2 no murmur or gallop, Pulmonary ;clear bilaterally, Abdomen ;soft nondistended bowel sounds are active she is tender in the left mid and  left lower quadrant there is no guarding or rebound no palpable mass or hepatosplenomegaly, Rectal; exam no external hemorrhoid noted digital exam is negative stool currently heme-negative, Extremities; no clubbing cyanosis or edema skin warm and dry, Psych; mood and affect appropriate        Assessment & Plan:  #69  53 year old female with an episode of hematochezia with passage of a mucoid blood clot from her rectum, 1 hour post normal bowel movement. Etiology of bleeding not clear she does not have any external hemorrhoids or previously documented internal hemorrhoids. Need to rule out colon lesion or colonic inflammatory process. #2 positive family history of colon cancer in patient's father #3 personal history of adenomatous colon polyps last colonoscopy January 2013 #4 1 year history of altered bowel habits with looser stools abdominal gas and bloating  and now with more persistent left-sided abdominal discomfort over the past couple of months. Symptoms initially began after an episode of an acute infectious colitis and were felt to be a post infectious IBS, with persistent left-sided pain rule out inflammatory process i.e. IBD   Plan; check CBC with differential, CRP and sedimentation rate today Schedule for colonoscopy with Dr. Ardis Hughs. Procedure discussed in detail with the patient and she is agreeable to proceed. She has been using a daily probiotic and will continue. Further plans pending results of labs and colonoscopy.

## 2014-08-03 ENCOUNTER — Ambulatory Visit: Payer: BC Managed Care – PPO | Admitting: Physical Therapy

## 2014-08-03 DIAGNOSIS — Z5189 Encounter for other specified aftercare: Secondary | ICD-10-CM | POA: Diagnosis not present

## 2014-08-03 NOTE — Progress Notes (Signed)
I agre with the note, plan above

## 2014-08-08 ENCOUNTER — Ambulatory Visit: Payer: BC Managed Care – PPO | Admitting: Physical Therapy

## 2014-08-08 DIAGNOSIS — Z5189 Encounter for other specified aftercare: Secondary | ICD-10-CM | POA: Diagnosis not present

## 2014-08-10 ENCOUNTER — Ambulatory Visit: Payer: BC Managed Care – PPO | Admitting: Physical Therapy

## 2014-08-10 ENCOUNTER — Emergency Department (HOSPITAL_COMMUNITY): Payer: BC Managed Care – PPO

## 2014-08-10 ENCOUNTER — Emergency Department (HOSPITAL_COMMUNITY)
Admission: EM | Admit: 2014-08-10 | Discharge: 2014-08-10 | Disposition: A | Discharge status: Home / Self Care | Payer: BC Managed Care – PPO | Attending: Emergency Medicine | Admitting: Emergency Medicine

## 2014-08-10 ENCOUNTER — Encounter (HOSPITAL_COMMUNITY): Payer: Self-pay | Admitting: Emergency Medicine

## 2014-08-10 ENCOUNTER — Telehealth: Payer: Self-pay | Admitting: Gastroenterology

## 2014-08-10 DIAGNOSIS — F329 Major depressive disorder, single episode, unspecified: Secondary | ICD-10-CM | POA: Diagnosis not present

## 2014-08-10 DIAGNOSIS — M13861 Other specified arthritis, right knee: Secondary | ICD-10-CM | POA: Insufficient documentation

## 2014-08-10 DIAGNOSIS — Z8619 Personal history of other infectious and parasitic diseases: Secondary | ICD-10-CM | POA: Diagnosis not present

## 2014-08-10 DIAGNOSIS — Z880 Allergy status to penicillin: Secondary | ICD-10-CM | POA: Insufficient documentation

## 2014-08-10 DIAGNOSIS — Z8719 Personal history of other diseases of the digestive system: Secondary | ICD-10-CM | POA: Insufficient documentation

## 2014-08-10 DIAGNOSIS — Z79899 Other long term (current) drug therapy: Secondary | ICD-10-CM | POA: Insufficient documentation

## 2014-08-10 DIAGNOSIS — E785 Hyperlipidemia, unspecified: Secondary | ICD-10-CM | POA: Insufficient documentation

## 2014-08-10 DIAGNOSIS — R109 Unspecified abdominal pain: Secondary | ICD-10-CM | POA: Diagnosis not present

## 2014-08-10 DIAGNOSIS — R112 Nausea with vomiting, unspecified: Secondary | ICD-10-CM | POA: Insufficient documentation

## 2014-08-10 DIAGNOSIS — Z792 Long term (current) use of antibiotics: Secondary | ICD-10-CM | POA: Diagnosis not present

## 2014-08-10 LAB — CBC WITH DIFFERENTIAL/PLATELET
BASOS ABS: 0 10*3/uL (ref 0.0–0.1)
Basophils Relative: 0 % (ref 0–1)
Eosinophils Absolute: 0.2 10*3/uL (ref 0.0–0.7)
Eosinophils Relative: 3 % (ref 0–5)
HCT: 38.4 % (ref 36.0–46.0)
Hemoglobin: 13.1 g/dL (ref 12.0–15.0)
LYMPHS PCT: 32 % (ref 12–46)
Lymphs Abs: 2 10*3/uL (ref 0.7–4.0)
MCH: 30.7 pg (ref 26.0–34.0)
MCHC: 34.1 g/dL (ref 30.0–36.0)
MCV: 89.9 fL (ref 78.0–100.0)
Monocytes Absolute: 0.4 10*3/uL (ref 0.1–1.0)
Monocytes Relative: 7 % (ref 3–12)
NEUTROS PCT: 58 % (ref 43–77)
Neutro Abs: 3.6 10*3/uL (ref 1.7–7.7)
PLATELETS: 178 10*3/uL (ref 150–400)
RBC: 4.27 MIL/uL (ref 3.87–5.11)
RDW: 12.5 % (ref 11.5–15.5)
WBC: 6.2 10*3/uL (ref 4.0–10.5)

## 2014-08-10 LAB — LIPASE, BLOOD: Lipase: 24 U/L (ref 11–59)

## 2014-08-10 LAB — COMPREHENSIVE METABOLIC PANEL
ALK PHOS: 117 U/L (ref 39–117)
ALT: 14 U/L (ref 0–35)
AST: 31 U/L (ref 0–37)
Albumin: 3.4 g/dL — ABNORMAL LOW (ref 3.5–5.2)
Anion gap: 10 (ref 5–15)
BUN: 19 mg/dL (ref 6–23)
CO2: 27 meq/L (ref 19–32)
Calcium: 9.3 mg/dL (ref 8.4–10.5)
Chloride: 106 mEq/L (ref 96–112)
Creatinine, Ser: 0.79 mg/dL (ref 0.50–1.10)
GFR calc non Af Amer: >90 mL/min (ref >90)
GLUCOSE: 70 mg/dL (ref 70–99)
POTASSIUM: 4.3 meq/L (ref 3.7–5.3)
SODIUM: 143 meq/L (ref 137–147)
Total Bilirubin: 0.2 mg/dL — ABNORMAL LOW (ref 0.3–1.2)
Total Protein: 7 g/dL (ref 6.0–8.3)

## 2014-08-10 MED ORDER — IOHEXOL 300 MG/ML  SOLN
100.0000 mL | Freq: Once | INTRAMUSCULAR | Status: AC | PRN
  Administered 2014-08-10: 100 mL via INTRAVENOUS

## 2014-08-10 MED ORDER — IOHEXOL 300 MG/ML  SOLN
50.0000 mL | Freq: Once | INTRAMUSCULAR | Status: AC | PRN
  Administered 2014-08-10: 50 mL via ORAL

## 2014-08-10 MED ORDER — ONDANSETRON HCL 4 MG/2ML IJ SOLN
4.0000 mg | Freq: Once | INTRAMUSCULAR | Status: DC
  Filled 2014-08-10: qty 2

## 2014-08-10 MED ORDER — ONDANSETRON HCL 4 MG PO TABS: 4.0000 mg | ORAL_TABLET | Freq: Four times a day (QID) | ORAL | Status: DC

## 2014-08-10 MED ORDER — FENTANYL CITRATE 0.05 MG/ML IJ SOLN
50.0000 ug | Freq: Once | INTRAMUSCULAR | Status: AC
  Administered 2014-08-10: 50 ug via INTRAVENOUS
  Filled 2014-08-10: qty 2

## 2014-08-10 NOTE — Discharge Instructions (Signed)

## 2014-08-10 NOTE — ED Notes (Signed)
Bed: WA17 Expected date:  Expected time:  Means of arrival:  Comments: NVD

## 2014-08-10 NOTE — ED Provider Notes (Signed)
CSN: [id number]     Arrival date & time 08/10/14  3151 History   First MD Initiated Contact with Patient 08/10/14 [phone]     Chief Complaint  Patient presents with  . Emesis  . Abdominal Pain     HPI Patient presents with abdominal pain in distention associated with nausea and emesis today.  Patient had past a blood clot approximately 10 days ago and follow up with her family doctor.  Her family doctor did rectal and a heme occult test which was negative.  She is scheduled for colonoscopy in approximately 10 days.  She denies any associated fever or chills.  She did have a normal bowel movement yesterday.  There was no blood.  She is a past history of irritable bowel and salmonella. Past Medical History  Diagnosis Date  . Depression   . Arthritis     right knee  . Fibromyalgia   . Degenerative disc disease   . Fibromyalgia   . Plantar fasciitis of left foot   . Salmonella poisoning   . IBS (irritable bowel syndrome)   . Hyperlipidemia   . PVC (premature ventricular contraction)   . Family history of early CAD   . Lumbar radiculitis    Past Surgical History  Procedure Laterality Date  . Knee arthroscopy    . Tonsillectomy    . Laproscopy    . Knee arthroscopy     Family History  Problem Relation Age of Onset  . Colon cancer Father   . Esophageal cancer Father   . Cancer Father   . Stroke Father   . Heart disease Father   . Hypertension Father   . Hyperlipidemia Father   . Heart disease Mother   . Diabetes Mother   . Arthritis Mother   . Heart attack Mother   . Hypertension Sister   . Diabetes Maternal Grandfather    History  Substance Use Topics  . Smoking status: Never Smoker   . Smokeless tobacco: Never Used  . Alcohol Use: No   OB History   Grav Para Term Preterm Abortions TAB SAB Ect Mult Living   2    1   1   0     Review of Systems  All other systems reviewed and are negative  Allergies  Dulcolax; Effexor; Penicillins; Prednisone; Tape; and  Zithromax  Home Medications   Prior to Admission medications   Medication Sig Start Date End Date Taking? Authorizing Provider  b complex vitamins tablet Take 1 tablet by mouth daily.   Yes Historical Provider, MD  buPROPion (WELLBUTRIN XL) 300 MG 24 hr tablet Take 1 tablet (300 mg total) by mouth daily. 05/25/13  Yes Lanice Shirts, MD  Calcium Carbonate-Vitamin D (CALCIUM 500 + D) 500-125 MG-UNIT TABS Take 1 tablet by mouth daily.   Yes Historical Provider, MD  diclofenac (VOLTAREN) 75 MG EC tablet Take 75 mg by mouth 2 (two) times daily.   Yes Historical Provider, MD  estradiol (ESTRACE) 1 MG tablet Take 1 tablet (1 mg total) by mouth daily.   Yes Azalia Bilis, MD  ferrous sulfate 325 (65 FE) MG tablet Take 325 mg by mouth every Monday, Wednesday, and Friday.    Yes Historical Provider, MD  fish oil-omega-3 fatty acids 1000 MG capsule Take 1 g by mouth daily.   Yes Historical Provider, MD  glucosamine-chondroitin 500-400 MG tablet Take 3 tablets by mouth daily.   Yes Historical Provider, MD  Multiple Vitamin (MULITIVITAMIN WITH MINERALS) TABS  Take 1 tablet by mouth daily.   Yes Historical Provider, MD  progesterone (PROMETRIUM) 100 MG capsule Take 1 capsule (100 mg total) by mouth daily. 06/07/14  Yes Lanice Shirts, MD  ondansetron (ZOFRAN) 4 MG tablet Take 1 tablet (4 mg total) by mouth every 6 (six) hours. 08/10/14   Dot Lanes, MD   BP 125/71  Pulse 67  Temp(Src) 98.3 F (36.8 C) (Oral)  Resp 19  SpO2 100%  LMP 10/28/2008 Physical Exam Physical Exam  Nursing note and vitals reviewed. Constitutional: She is oriented to person, place, and time. She appears well-developed and well-nourished. No distress.  HENT:  Head: Normocephalic and atraumatic.  Eyes: Pupils are equal, round, and reactive to light.  Neck: Normal range of motion.  Cardiovascular: Normal rate and intact distal pulses.   Pulmonary/Chest: No respiratory distress.  Abdominal: Normal appearance.  She exhibits no distension.  no rebound no guarding tenderness.  Bowel sounds are active.  Slight increase in tympany. Musculoskeletal: Normal range of motion.  Neurological: She is alert and oriented to person, place, and time. No cranial nerve deficit.  Skin: Skin is warm and dry. No rash noted.  Psychiatric: She has a normal mood and affect. Her behavior is normal.  she appears anxious  ED Course  Procedures (including critical care time) Medications  ondansetron (ZOFRAN) injection 4 mg (0 mg Intravenous Hold 08/10/14 1013)  fentaNYL (SUBLIMAZE) injection 50 mcg (50 mcg Intravenous Given 08/10/14 1013)  iohexol (OMNIPAQUE) 300 MG/ML solution 50 mL (50 mLs Oral Contrast Given 08/10/14 1153)  iohexol (OMNIPAQUE) 300 MG/ML solution 100 mL (100 mLs Intravenous Contrast Given 08/10/14 1153)    Labs Review Labs Reviewed  COMPREHENSIVE METABOLIC PANEL - Abnormal; Notable for the following:    Albumin 3.4 (*)    Total Bilirubin 0.2 (*)    All other components within normal limits  CBC WITH DIFFERENTIAL  LIPASE, BLOOD    Imaging Review Ct Abdomen Pelvis W Contrast  08/10/2014   CLINICAL DATA:  Abdominal pain, vomiting, recent history of lower GI bleed  EXAM: CT ABDOMEN AND PELVIS WITH CONTRAST  TECHNIQUE: Multidetector CT imaging of the abdomen and pelvis was performed using the standard protocol following bolus administration of intravenous contrast.  CONTRAST:  94mL OMNIPAQUE IOHEXOL 300 MG/ML SOLN, 170mL OMNIPAQUE IOHEXOL 300 MG/ML SOLN  COMPARISON:  Ultrasound abdomen of 01/01/2013  FINDINGS: The lung bases are clear. The liver enhances with no focal abnormality other than a probable cyst posteriorly in the right lobe. No calcified gallstones are seen. The pancreas is normal in size in the pancreatic duct is not dilated. The adrenal glands and spleen are unremarkable. The stomach is moderately fluid distended with no abnormality noted. The kidneys enhance with no calculus or mass.  Low-attenuation within the left renal pelvis on delayed images is due to a left parapelvic renal cyst. No hydronephrosis is seen in the proximal ureters are normal in caliber. The abdominal aorta is normal in caliber. No adenopathy is noted.  The urinary bladder is moderately urine distended with no abnormality noted. The uterus is normal in size. No adnexal lesion is seen. No fluid is noted within the pelvis. There is a moderate amount of feces throughout the colon. The terminal ileum is unremarkable. The appendix is moderately well seen with no evidence of appendicitis. Mild curvature of the lumbar spine convex to the left is noted.  IMPRESSION: 1. No explanation for the patient's abdominal pain is seen. No renal calculi are noted. 2.  The appendix and terminal ileum are unremarkable. 3. Left parapelvic renal cyst.   Electronically Signed   By: Ivar Drape M.D.   On: 08/10/2014 12:32    Patient has scheduled colonoscopy for next Tuesday.  This will be further delineation for her bout with blood in her stool.  All labs and CT of the abdomen are unremarkable for any acute process.  Patient appears stable for discharge.  She'll begin prescription for Zofran for nausea and advised to return should new or worsening symptoms occur.  MDM   Final diagnoses:  Non-intractable vomiting with nausea, vomiting of unspecified type        Dot Lanes, MD 08/10/14 1249

## 2014-08-10 NOTE — ED Notes (Addendum)
Per EMS patient reports to ED for abdominal pain, emesis onset today. Pt states 10 days ago she had 1 lower GI bleed episode which self-resolved and has an appointment for a colonoscopy due to family Hx of colon cancer, no personal Hx. Positive orthostatics. EMS administered 8 mg zofran en route.

## 2014-08-10 NOTE — ED Notes (Signed)
Patient transported to CT 

## 2014-08-10 NOTE — ED Notes (Signed)
MD at bedside. 

## 2014-08-10 NOTE — Telephone Encounter (Signed)
The pt is currently at the ED at [facility] being treated.  She will call if she has concerns or questions for Dr Ardis Hughs.  I advised her to keep her colon with Dr Ardis Hughs unless this is changed today at the ER

## 2014-08-10 NOTE — ED Notes (Signed)
Patient was educated not to drive, operate heavy machinery, or drink alcohol while taking narcotic medication.  

## 2014-08-12 ENCOUNTER — Other Ambulatory Visit: Payer: Self-pay

## 2014-08-15 ENCOUNTER — Ambulatory Visit: Payer: BC Managed Care – PPO | Admitting: Physical Therapy

## 2014-08-15 DIAGNOSIS — Z5189 Encounter for other specified aftercare: Secondary | ICD-10-CM | POA: Diagnosis not present

## 2014-08-16 ENCOUNTER — Encounter: Payer: Self-pay | Admitting: Gastroenterology

## 2014-08-16 ENCOUNTER — Ambulatory Visit (AMBULATORY_SURGERY_CENTER): Payer: BC Managed Care – PPO | Admitting: Gastroenterology

## 2014-08-16 VITALS — BP 116/70 | HR 59 | Temp 97.9°F | Resp 29 | Ht 67.0 in | Wt 165.0 lb

## 2014-08-16 DIAGNOSIS — K644 Residual hemorrhoidal skin tags: Secondary | ICD-10-CM

## 2014-08-16 DIAGNOSIS — K625 Hemorrhage of anus and rectum: Secondary | ICD-10-CM

## 2014-08-16 DIAGNOSIS — Z800 Family history of malignant neoplasm of digestive organs: Secondary | ICD-10-CM

## 2014-08-16 MED ORDER — SODIUM CHLORIDE 0.9 % IV SOLN: 500.0000 mL | INTRAVENOUS | Status: DC

## 2014-08-16 NOTE — Patient Instructions (Signed)
YOU HAD AN ENDOSCOPIC PROCEDURE TODAY AT THE [facility]: Refer to the procedure report that was given to you for any specific questions about what was found during the examination.  If the procedure report does not answer your questions, please call your gastroenterologist to clarify.  If you requested that your care partner not be given the details of your procedure findings, then the procedure report has been included in a sealed envelope for you to review at your convenience later.  YOU SHOULD EXPECT: Some feelings of bloating in the abdomen. Passage of more gas than usual.  Walking can help get rid of the air that was put into your GI tract during the procedure and reduce the bloating. If you had a lower endoscopy (such as a colonoscopy or flexible sigmoidoscopy) you may notice spotting of blood in your stool or on the toilet paper. If you underwent a bowel prep for your procedure, then you may not have a normal bowel movement for a few days.  DIET: Your first meal following the procedure should be a light meal and then it is ok to progress to your normal diet.  A half-sandwich or bowl of soup is an example of a good first meal.  Heavy or fried foods are harder to digest and may make you feel nauseous or bloated.  Likewise meals heavy in dairy and vegetables can cause extra gas to form and this can also increase the bloating.  Drink plenty of fluids but you should avoid alcoholic beverages for 24 hours.  ACTIVITY: Your care partner should take you home directly after the procedure.  You should plan to take it easy, moving slowly for the rest of the day.  You can resume normal activity the day after the procedure however you should NOT DRIVE or use heavy machinery for 24 hours (because of the sedation medicines used during the test).    SYMPTOMS TO REPORT IMMEDIATELY: A gastroenterologist can be reached at any hour.  During normal business hours, 8:30 AM to 5:00 PM Monday through Friday,  call [phone].  After hours and on weekends, please call the GI answering service at [phone] who will take a message and have the physician on call contact you.   Following lower endoscopy (colonoscopy or flexible sigmoidoscopy):  Excessive amounts of blood in the stool  Significant tenderness or worsening of abdominal pains  Swelling of the abdomen that is new, acute  Fever of 100F or higher  FOLLOW UP: If any biopsies were taken you will be contacted by phone or by letter within the next 1-3 weeks.  Call your gastroenterologist if you have not heard about the biopsies in 3 weeks.  Our staff will call the home number listed on your records the next business day following your procedure to check on you and address any questions or concerns that you may have at that time regarding the information given to you following your procedure. This is a courtesy call and so if there is no answer at the home number and we have not heard from you through the emergency physician on call, we will assume that you have returned to your regular daily activities without incident.  SIGNATURES/CONFIDENTIALITY: You and/or your care partner have signed paperwork which will be entered into your electronic medical record.  These signatures attest to the fact that that the information above on your After Visit Summary has been reviewed and is understood.  Full responsibility of the confidentiality of this   discharge information lies with you and/or your care-partner.  Recommendations Next colonoscopy in 5 years 

## 2014-08-16 NOTE — Op Note (Signed)
[place]  [address] [place], [id number]   COLONOSCOPY PROCEDURE REPORT  PATIENT: Megan Combs, Megan Combs  MR#: [id number] BIRTHDATE: 1961/03/06 , 53  yrs. old GENDER: female ENDOSCOPIST: Milus Banister, MD PROCEDURE DATE:  08/16/2014 PROCEDURE:   Colonoscopy, diagnostic First Screening Colonoscopy - Avg.  risk and is 50 yrs.  old or older - No.  Prior Negative Screening - Now for repeat screening. N/A  History of Adenoma - Now for follow-up colonoscopy & has been > or = to 3 yrs.  No.  It has been less than 3 yrs since last colonoscopy.  Other: See Comments  Polyps Removed Today? No. Recommend repeat exam, <10 yrs? Yes.  High risk (family or personal hx). ASA CLASS:   Class II INDICATIONS:recent rectal bleeding, FH of colon cancer (father), small TA removed 2013. MEDICATIONS: Monitored anesthesia care and Propofol 200 mg IV  DESCRIPTION OF PROCEDURE:   After the risks benefits and alternatives of the procedure were thoroughly explained, informed consent was obtained.  The digital rectal exam revealed no abnormalities of the rectum.   The LB [id number]  endoscope was introduced through the anus and advanced to the cecum, which was identified by both the appendix and ileocecal valve. No adverse events experienced.   The quality of the prep was excellent.  The instrument was then slowly withdrawn as the colon was fully examined.  COLON FINDINGS: One small external hemorrhoid was found.   The examination was otherwise normal.   The examined terminal ileum appeared to be normal.  Retroflexed views revealed no abnormalities. The time to cecum=3 minutes 54 seconds.  Withdrawal time=10 minutes 19 seconds.  The scope was withdrawn and the procedure completed. COMPLICATIONS: There were no immediate complications.  ENDOSCOPIC IMPRESSION: 1.   One small external hemorrhoid (this was the likely cause of your recent minor rectal bleeding) 2.   The examination was  otherwise normal (no polyps or cancers) 3.   The examined terminal ileum was normal (no Crohn's changes)  RECOMMENDATIONS: Given your significant family history of colon cancer, you should have a repeat colonoscopy in 5 years  eSigned:  Milus Banister, MD 08/16/2014 8:20 AM   cc: Arlyce Dice, MD

## 2014-08-16 NOTE — Progress Notes (Signed)
Procedure ends, to recovery, report given and VSS. 

## 2014-08-17 ENCOUNTER — Ambulatory Visit: Payer: BC Managed Care – PPO | Admitting: Physical Therapy

## 2014-08-17 ENCOUNTER — Telehealth: Payer: Self-pay | Admitting: *Deleted

## 2014-08-17 NOTE — Telephone Encounter (Signed)
  Follow up Call-  Call back number 08/16/2014 11/22/2011  Post procedure Call Back phone  # [phone] cell [phone], May leave a messsage  Permission to leave phone message Yes -     No answer, left message.

## 2014-08-29 ENCOUNTER — Encounter: Payer: Self-pay | Admitting: Gastroenterology

## 2014-08-31 ENCOUNTER — Other Ambulatory Visit: Payer: Self-pay

## 2014-08-31 ENCOUNTER — Encounter: Payer: Self-pay | Admitting: Family Medicine

## 2014-08-31 ENCOUNTER — Ambulatory Visit (INDEPENDENT_AMBULATORY_CARE_PROVIDER_SITE_OTHER): Payer: BC Managed Care – PPO | Admitting: Family Medicine

## 2014-08-31 VITALS — BP 137/84 | HR 93 | Ht 67.0 in | Wt 160.0 lb

## 2014-08-31 DIAGNOSIS — N951 Menopausal and female climacteric states: Secondary | ICD-10-CM

## 2014-08-31 DIAGNOSIS — M5442 Lumbago with sciatica, left side: Secondary | ICD-10-CM

## 2014-08-31 NOTE — Telephone Encounter (Signed)
Pt needs AEX scheduled if we are going to be the ones writing rx for pt.  She will need RFs of both Estradiol and Prometirum.  Please call pt and one exam scheduled, can RF through then, as long as appt is in a reasonable amount of time.  For now, declined rx refill.

## 2014-08-31 NOTE — Telephone Encounter (Signed)
Incoming Refill Request from [facility] IZ:TIWPYKD 1mg   Last AEX:05/14/13 Last Refill:05/03/14 #30 X 3 Next AEX:NS Last Office Visit: 05/11/14 (Dr. Charlies Constable) Last MMG:05/25/14 Bi-Rads Neg  Please Advise

## 2014-09-01 NOTE — Telephone Encounter (Signed)
LMTCB and schedule AEX for any further refill

## 2014-09-02 NOTE — Telephone Encounter (Signed)
LMTCB to schedule AEX for any further refills

## 2014-09-02 NOTE — Assessment & Plan Note (Signed)
consistent with lumbar radiculopathy from her fall.  Based on MRI results I suspect issue is at L4-5 on the left where she has mild foraminal narrowing.  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  This is benign and unchanged from prior MRIs (we reviewed this dating back to MRI in 2008 - 55mm hemangioma).  She does not need referral for this condition.

## 2014-09-02 NOTE — Progress Notes (Signed)
Patient ID: Megan Combs, female   DOB: 09/21/1961, 53 y.o.   MRN: [id number]  PCP: Kelton Pillar, MD  Subjective:   HPI: Patient is a 53 y.o. female here to discuss recent MRI.  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 Immediate pain in left side with associated numbness and tingling down left leg wrapping around to medial left foot. Similar to prior issues with disc bulges in back that had completely recovered prior to this recent incident. Tried ice, heat, ibuprofen. Went to [facility] and prescribed a medrol dose pack and MRI ordered as well. No bowel/bladder dysfunction.  11/4: Patient has continued to have pain in low back into left leg. She had an MRI which she wanted to go over with me today as she was recommended to see a neurosurgeon. MRI showed multilevel DDD, facet arthrosis.  At L4-5 she does have ligamentum flavum redundance with mild foraminal stenosis to the left.  No other evidence of possible nerve impingement.  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  Past Medical History  Diagnosis Date  . Depression   . Arthritis     right knee  . Fibromyalgia   . Degenerative disc disease   . Fibromyalgia   . Plantar fasciitis of left foot   . Salmonella poisoning   . IBS (irritable bowel syndrome)   . Hyperlipidemia   . PVC (premature ventricular contraction)   . Family history of early CAD   . Lumbar radiculitis   . Allergy   . Anemia   . Anxiety     Current Outpatient Prescriptions on File Prior to Visit  Medication Sig Dispense Refill  . b complex vitamins tablet Take 1 tablet by mouth daily.    Marland Kitchen buPROPion (WELLBUTRIN XL) 300 MG 24 hr tablet Take 1 tablet (300 mg total) by mouth daily. 30 tablet 1  . Calcium Carbonate-Vitamin D (CALCIUM 500 + D)  500-125 MG-UNIT TABS Take 1 tablet by mouth daily.    . diclofenac (VOLTAREN) 75 MG EC tablet Take 75 mg by mouth 2 (two) times daily.    Marland Kitchen estradiol (ESTRACE) 1 MG tablet Take 1 tablet (1 mg total) by mouth daily. 30 tablet 3  . ferrous sulfate 325 (65 FE) MG tablet Take 325 mg by mouth every Monday, Wednesday, and Friday.     . fish oil-omega-3 fatty acids 1000 MG capsule Take 1 g by mouth daily.    Marland Kitchen glucosamine-chondroitin 500-400 MG tablet Take 3 tablets by mouth daily.    . Multiple Vitamin (MULITIVITAMIN WITH MINERALS) TABS Take 1 tablet by mouth daily.    . ondansetron (ZOFRAN) 4 MG tablet Take 1 tablet (4 mg total) by mouth every 6 (six) hours. 12 tablet 0  . progesterone (PROMETRIUM) 100 MG capsule Take 1 capsule (100 mg total) by mouth daily. 90 capsule 1   No current facility-administered medications on file prior to visit.    Past Surgical History  Procedure Laterality Date  . Knee arthroscopy    . Tonsillectomy    . Laproscopy    . Knee arthroscopy    . Colonoscopy      Allergies  Allergen Reactions  . Dulcolax [Bisacodyl]     Dehydration   . Effexor [Venlafaxine]     Intolerant SSRI/SNRI  .  Penicillins Hives  . Prednisone     Paranoid , anxious, hyper emotionally, could not sleep, odd dreams  . Tape Dermatitis  . Zithromax [Azithromycin] Rash    History   Social History  . Marital Status: Married    Spouse Name: N/A    Number of Children: 0  . Years of Education: N/A   Occupational History  . RN   .  [employer]   Social History Main Topics  . Smoking status: Never Smoker   . Smokeless tobacco: Never Used  . Alcohol Use: No  . Drug Use: No  . Sexual Activity: Yes    Birth Control/ Protection: Post-menopausal   Other Topics Concern  . Not on file   Social History Narrative    Family History  Problem Relation Age of Onset  . Colon cancer Father   . Esophageal cancer Father   . Cancer Father   . Stroke Father   . Heart disease  Father   . Hypertension Father   . Hyperlipidemia Father   . Diabetes Father   . Prostate cancer Father   . Stomach cancer Father   . Heart disease Mother   . Arthritis Mother   . Heart attack Mother   . Diabetes Maternal Grandfather   . Hypertension Brother   . Pancreatic cancer Neg Hx   . Rectal cancer Neg Hx     BP 137/84 mmHg  Pulse 93  Ht 5\' 7"  (1.702 m)  Wt 160 lb (72.576 kg)  BMI 25.05 kg/m2  LMP 10/28/2008  Review of Systems: See HPI above.    Objective:  Physical Exam:  Gen: NAD Exam not repeated today.  Back: No gross deformity, scoliosis. TTP left low lumbar paraspinal region.  No midline or bony TTP. FROM. Strength LEs 5/5 all muscle groups.   2+ MSRs in patellar and achilles tendons, equal bilaterally. Negative SLRs. Sensation diminished to light touch lateral left thigh, medial left lower leg. Negative logroll bilateral hips Negative fabers and piriformis stretches.    Assessment & Plan:  1. Low back pain - consistent with lumbar radiculopathy from her fall.  Based on MRI results I suspect issue is at L4-5 on the left where she has mild foraminal narrowing.  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  This is benign and unchanged from prior MRIs (we reviewed this dating back to MRI in 2008 - 77mm hemangioma).  She does not need referral for this condition.   Visit time 15 minutes, more than half of which spent reviewing records and in counseling.

## 2014-09-05 NOTE — Telephone Encounter (Signed)
LMTCB concerning refill request and schdeuling an AEX. I have tried to reach this pt 3 times with no call back.   Will closed encounter and route to provider for final review.

## 2014-09-06 NOTE — Telephone Encounter (Signed)
Agree.  Encounter closed.  Thank you.

## 2014-09-07 ENCOUNTER — Other Ambulatory Visit: Payer: Self-pay

## 2014-09-07 ENCOUNTER — Encounter: Payer: Self-pay | Admitting: *Deleted

## 2014-09-07 DIAGNOSIS — N951 Menopausal and female climacteric states: Secondary | ICD-10-CM

## 2014-09-07 NOTE — Telephone Encounter (Signed)
Keri [phone] [facility] called to see if we could refill her estradiol (ESTRACE) 1 MG tablet. Dr Charlies Constable usually refills these for her but has recently retired, so she can not get them filled, she will be in for her CPE on 10/03/14

## 2014-09-08 MED ORDER — ESTRADIOL 1 MG PO TABS: 1.0000 mg | ORAL_TABLET | Freq: Every day | ORAL | Status: DC

## 2014-09-08 NOTE — Telephone Encounter (Signed)
Dr. Charlies Constable is now retired which is why she requested that we fill this.

## 2014-09-08 NOTE — Telephone Encounter (Signed)
[employer] and advise her that since she has had such trouble with meds for her hot flushes and Dr. Charlies Constable gave her her most recent dosing,  She should follow her menopausal symptoms with Dr. Charlies Constable and have her refill these meds

## 2014-09-08 NOTE — Addendum Note (Signed)
Addended by: Emi Belfast D on: 09/08/2014 12:54 PM   Modules accepted: Orders

## 2014-09-12 ENCOUNTER — Telehealth: Payer: Self-pay | Admitting: Family Medicine

## 2014-09-12 NOTE — Telephone Encounter (Signed)
Had she been on light duty?  I had that she returned to full duty back in May in a letter.

## 2014-09-13 ENCOUNTER — Encounter: Payer: Self-pay | Admitting: Family Medicine

## 2014-09-27 ENCOUNTER — Encounter: Payer: Self-pay | Admitting: Family Medicine

## 2014-09-29 ENCOUNTER — Other Ambulatory Visit: Payer: Self-pay | Admitting: *Deleted

## 2014-09-29 DIAGNOSIS — Z0000 Encounter for general adult medical examination without abnormal findings: Secondary | ICD-10-CM

## 2014-10-03 ENCOUNTER — Ambulatory Visit (INDEPENDENT_AMBULATORY_CARE_PROVIDER_SITE_OTHER): Payer: BC Managed Care – PPO | Admitting: Internal Medicine

## 2014-10-03 ENCOUNTER — Encounter: Payer: Self-pay | Admitting: Internal Medicine

## 2014-10-03 VITALS — BP 114/70 | HR 73 | Resp 16 | Ht 66.0 in | Wt 168.0 lb

## 2014-10-03 DIAGNOSIS — Z0189 Encounter for other specified special examinations: Secondary | ICD-10-CM | POA: Diagnosis not present

## 2014-10-03 DIAGNOSIS — Z0000 Encounter for general adult medical examination without abnormal findings: Secondary | ICD-10-CM

## 2014-10-03 LAB — POCT URINALYSIS DIPSTICK
Bilirubin, UA: NEGATIVE
Blood, UA: NEGATIVE
Glucose, UA: NEGATIVE
KETONES UA: NEGATIVE
Leukocytes, UA: NEGATIVE
Nitrite, UA: NEGATIVE
PH UA: 6.5
PROTEIN UA: NEGATIVE
SPEC GRAV UA: 1.015
UROBILINOGEN UA: NEGATIVE

## 2014-10-03 NOTE — Patient Instructions (Signed)
See me as needed 

## 2014-10-03 NOTE — Progress Notes (Signed)
Subjective:    Patient ID: Megan Combs, female    DOB: Aug 11, 1961, 53 y.o.   MRN: [id number]  HPI 10/20 endoscopy report ENDOSCOPIC IMPRESSION: 1. One small external hemorrhoid (this was the likely cause of your recent minor rectal bleeding) 2. The examination was otherwise normal (no polyps or cancers) 3. The examined terminal ileum was normal (no Crohn's changes)  RECOMMENDATIONS: Given your significant family history of colon cancer, you should have a repeat colonoscopy in 5 years  eSigned: Milus Banister, MD 08/16/2014 8:20 AM    11/04/ sport med note 1. Low back pain - consistent with lumbar radiculopathy from her fall. Based on MRI results I suspect issue is at L4-5 on the left where she has mild foraminal narrowing. 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 This is benign and unchanged from prior MRIs (we reviewed this dating back to MRI in 2008 - 67mm hemangioma). She does not need referral for this condition.   Visit time 15 minutes, more than half of which spent reviewing records and in counseling.            Low back pain - Dene Gentry, MD at 09/02/2014 11:46 AM     Status: Written Related Problem: Low back pain   Expand All Collapse All   consistent with lumbar radiculopathy from her fall. Based on MRI results I suspect issue is at L4-5 on the left where she has mild foraminal narrowing. 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 This is benign and unchanged from prior MRIs (we reviewed this dating back to MRI in 2008 - 60mm hemangioma). She does not need referral for this condition.             Therapy Notes      Low back pain - Dene Gentry, MD at 09/02/2014 11:46 AM     Status: Written Related Problem: Low back pain   Expand All Collapse All   consistent with lumbar radiculopathy from her fall. Based on MRI results I suspect issue is at L4-5 on the left where she has mild  foraminal narrowing. 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 This is benign and unchanged from prior MRIs (we reviewed this dating back to MRI in 2008 - 61mm hemangioma). She does not need referral for this condition.              TODAY  Megan Combs is here for CPE.   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   Anxiety/depression  On Wellbutrin now cannot tolerate SSRI or SNRI  Mood fine  Salmonella enteritis  Pt reports her IBS continues to flare.  She had recent colonoscopy Dr. Ardis Hughs for rectal bleed  .  Felt to be a hemmorrhoid  She does describe sexual dysfunction manifested by delayed orgasm.  No vaginal dryness or pain with intercourse.  She is on Estrace and prometrium .      Allergies  Allergen Reactions  . Dulcolax [Bisacodyl]     Dehydration   . Effexor [Venlafaxine]     Intolerant SSRI/SNRI  . Penicillins Hives  . Prednisone     Paranoid , anxious, hyper emotionally, could not sleep, odd dreams  . Tape Dermatitis  . Zithromax [Azithromycin] Rash  Past Medical History  Diagnosis Date  . Depression   . Arthritis     right knee  . Fibromyalgia   . Degenerative disc disease   . Fibromyalgia   . Plantar fasciitis of left foot   . Salmonella poisoning   . IBS (irritable bowel syndrome)   . Hyperlipidemia   . PVC (premature ventricular contraction)   . Family history of early CAD   . Lumbar radiculitis   . Allergy   . Anemia   . Anxiety    Past Surgical History  Procedure Laterality Date  . Knee arthroscopy    . Tonsillectomy    . Laproscopy    . Knee arthroscopy    . Colonoscopy     History   Social History  . Marital Status: Married    Spouse Name: N/A    Number of Children: 0  . Years of Education: N/A   Occupational History  . RN   .  [employer]   Social History Main  Topics  . Smoking status: Never Smoker   . Smokeless tobacco: Never Used  . Alcohol Use: No  . Drug Use: No  . Sexual Activity: Yes    Birth Control/ Protection: Post-menopausal   Other Topics Concern  . Not on file   Social History Narrative   Family History  Problem Relation Age of Onset  . Colon cancer Father   . Esophageal cancer Father   . Cancer Father   . Stroke Father   . Heart disease Father   . Hypertension Father   . Hyperlipidemia Father   . Diabetes Father   . Prostate cancer Father   . Stomach cancer Father   . Heart disease Mother   . Arthritis Mother   . Heart attack Mother   . Diabetes Maternal Grandfather   . Hypertension Brother   . Pancreatic cancer Neg Hx   . Rectal cancer Neg Hx    Patient Active Problem List   Diagnosis Date Noted  . Medication intolerance 07/04/2014  . Dyslipidemia 06/29/2014  . Family history of early CAD 06/29/2014  . Palpitations 06/29/2014  . Hyperlipidemia   . PVC (premature ventricular contraction)   . Low back pain 06/20/2014  . Right hip pain 02/15/2014  . Right shoulder injury 02/01/2014  . Salmonella 10/27/2013  . Breast density 03/17/2013  . Renal cyst, left 01/06/2013  . Family history of colon cancer 01/03/2013  . Elevated alkaline phosphatase measurement 01/03/2013  . Articular cartilage disorder of knee 12/24/2012  . Plantar fasciitis 07/21/2012  . Anxiety 07/02/2012  . History of anemia 07/01/2012  . Depression 07/01/2012  . Menopause 07/01/2012  . Menopausal hot flushes 07/01/2012  . DJD (degenerative joint disease) 07/01/2012  . Other and unspecified hyperlipidemia 07/01/2012  . Vaginal dryness, menopausal 07/01/2012  . Urge incontinence of urine 07/01/2012  . Fibromyalgia   . Family history of malignant neoplasm 12/25/2011  . Personal history of colonic polyps 12/25/2011   Current Outpatient Prescriptions on File Prior to Visit  Medication Sig Dispense Refill  . b complex vitamins tablet Take  1 tablet by mouth daily.    Marland Kitchen buPROPion (WELLBUTRIN XL) 300 MG 24 hr tablet Take 1 tablet (300 mg total) by mouth daily. 30 tablet 1  . Calcium Carbonate-Vitamin D (CALCIUM 500 + D) 500-125 MG-UNIT TABS Take 1 tablet by mouth daily.    . diclofenac (VOLTAREN) 75 MG EC tablet Take 75 mg by mouth 2 (two) times daily.    Marland Kitchen  estradiol (ESTRACE) 1 MG tablet Take 1 tablet (1 mg total) by mouth daily. 30 tablet 3  . ferrous sulfate 325 (65 FE) MG tablet Take 325 mg by mouth every Monday, Wednesday, and Friday.     . fish oil-omega-3 fatty acids 1000 MG capsule Take 1 g by mouth daily.    Marland Kitchen glucosamine-chondroitin 500-400 MG tablet Take 3 tablets by mouth daily.    . Multiple Vitamin (MULITIVITAMIN WITH MINERALS) TABS Take 1 tablet by mouth daily.    . ondansetron (ZOFRAN) 4 MG tablet Take 1 tablet (4 mg total) by mouth every 6 (six) hours. 12 tablet 0  . progesterone (PROMETRIUM) 100 MG capsule Take 1 capsule (100 mg total) by mouth daily. 90 capsule 1   No current facility-administered medications on file prior to visit.       Review of Systems  Respiratory: Negative for cough, chest tightness, shortness of breath and wheezing.   Cardiovascular: Negative for chest pain, palpitations and leg swelling.  All other systems reviewed and are negative.      Objective:   Physical Exam Physical Exam  Nursing note and vitals reviewed.  Constitutional: She is oriented to person, place, and time. She appears well-developed and well-nourished.  HENT:  Head: Normocephalic and atraumatic.  Right Ear: Tympanic membrane and ear canal normal. No drainage. Tympanic membrane is not injected and not erythematous.  Left Ear: Tympanic membrane and ear canal normal. No drainage. Tympanic membrane is not injected and not erythematous.  Nose: Nose normal. Right sinus exhibits no maxillary sinus tenderness and no frontal sinus tenderness. Left sinus exhibits no maxillary sinus tenderness and no frontal sinus  tenderness.  Mouth/Throat: Oropharynx is clear and moist. No oral lesions. No oropharyngeal exudate.  Eyes: Conjunctivae and EOM are normal. Pupils are equal, round, and reactive to light.  Neck: Normal range of motion. Neck supple. No JVD present. Carotid bruit is not present. No mass and no thyromegaly present.  Cardiovascular: Normal rate, regular rhythm, S1 normal, S2 normal and intact distal pulses. Exam reveals no gallop and no friction rub.  No murmur heard.  Pulses:  Carotid pulses are 2+ on the right side, and 2+ on the left side.  Dorsalis pedis pulses are 2+ on the right side, and 2+ on the left side.  No carotid bruit. No LE edema  Pulmonary/Chest: Breath sounds normal. She has no wheezes. She has no rales. She exhibits no tenderness.  Breast no discrete mass no nipple discharge no axillary adenopathy bilaterally  Abdominal: Soft. Bowel sounds are normal. She exhibits no distension and no mass. There is no hepatosplenomegaly. There is no tenderness. There is no CVA tenderness.  Musculoskeletal: Normal range of motion.  No active synovitis to joints.  Lymphadenopathy:  She has no cervical adenopathy.  She has no axillary adenopathy.  Right: No inguinal and no supraclavicular adenopathy present.  Left: No inguinal and no supraclavicular adenopathy present.  Neurological: She is alert and oriented to person, place, and time. She has normal strength and normal reflexes. She displays no tremor. No cranial nerve deficit or sensory deficit. Coordination and gait normal.  Skin: Skin is warm and dry. No rash noted. No cyanosis. Nails show no clubbing.  Psychiatric: She has a normal mood and affect. Her speech is normal and behavior is normal. Cognition and memory are normal.          Assessment & Plan:  HM:  See scanned sheet pt is a non-smoker  UTD with vaccine  Hyperlipidemia  Recent values great  Anxiety/depression   Continue Wellbutrin  Menopause on HT  Continue  estrace/wellbutrin  Salmonella enteritis/IBS followed GI  Delayed orgasm.   Pt desires therapist will refer

## 2014-10-04 ENCOUNTER — Encounter: Payer: Self-pay | Admitting: Internal Medicine

## 2014-10-04 ENCOUNTER — Other Ambulatory Visit: Payer: Self-pay | Admitting: Internal Medicine

## 2014-10-04 LAB — COMPLETE METABOLIC PANEL WITH GFR
ALBUMIN: 4 g/dL (ref 3.5–5.2)
ALT: 12 U/L (ref 0–35)
AST: 23 U/L (ref 0–37)
Alkaline Phosphatase: 96 U/L (ref 39–117)
BUN: 19 mg/dL (ref 6–23)
CALCIUM: 9.5 mg/dL (ref 8.4–10.5)
CHLORIDE: 107 meq/L (ref 96–112)
CO2: 26 meq/L (ref 19–32)
CREATININE: 0.75 mg/dL (ref 0.50–1.10)
GFR, Est Non African American: >89 mL/min
Glucose, Bld: 61 mg/dL — ABNORMAL LOW (ref 70–99)
POTASSIUM: 4.3 meq/L (ref 3.5–5.3)
Sodium: 140 mEq/L (ref 135–145)
Total Bilirubin: 0.3 mg/dL (ref 0.2–1.2)
Total Protein: 6.7 g/dL (ref 6.0–8.3)

## 2014-10-04 LAB — TSH: TSH: 2.503 u[IU]/mL (ref 0.350–4.500)

## 2014-10-04 LAB — VITAMIN D 25 HYDROXY (VIT D DEFICIENCY, FRACTURES): Vit D, 25-Hydroxy: 40 ng/mL (ref 30–100)

## 2014-10-04 MED ORDER — NYSTATIN-TRIAMCINOLONE 100000-0.1 UNIT/GM-% EX OINT: 1.0000 "application " | TOPICAL_OINTMENT | Freq: Two times a day (BID) | CUTANEOUS | Status: DC

## 2014-10-10 ENCOUNTER — Encounter: Payer: Self-pay | Admitting: *Deleted

## 2014-11-03 ENCOUNTER — Ambulatory Visit: Payer: BC Managed Care – PPO | Admitting: Internal Medicine

## 2014-11-03 ENCOUNTER — Encounter: Payer: Self-pay | Admitting: Internal Medicine

## 2014-11-03 ENCOUNTER — Ambulatory Visit (INDEPENDENT_AMBULATORY_CARE_PROVIDER_SITE_OTHER): Payer: BC Managed Care – PPO | Admitting: Internal Medicine

## 2014-11-03 VITALS — BP 120/78 | HR 83 | Resp 16 | Wt 162.0 lb

## 2014-11-03 DIAGNOSIS — N3000 Acute cystitis without hematuria: Secondary | ICD-10-CM | POA: Diagnosis not present

## 2014-11-03 DIAGNOSIS — R300 Dysuria: Secondary | ICD-10-CM | POA: Diagnosis not present

## 2014-11-03 LAB — POCT URINALYSIS DIPSTICK
BILIRUBIN UA: NEGATIVE
Glucose, UA: NEGATIVE
KETONES UA: NEGATIVE
Nitrite, UA: NEGATIVE
PH UA: 6.5
Protein, UA: NEGATIVE
RBC UA: NEGATIVE
Spec Grav, UA: 1.01
UROBILINOGEN UA: NEGATIVE

## 2014-11-03 MED ORDER — NITROFURANTOIN MONOHYD MACRO 100 MG PO CAPS: 100.0000 mg | ORAL_CAPSULE | Freq: Two times a day (BID) | ORAL | Status: DC

## 2014-11-03 NOTE — Progress Notes (Signed)
Subjective:    Patient ID: Megan Combs, female    DOB: 1961-06-11, 54 y.o.   MRN: [id number]  HPI  Megan Combs is here for acute visit.  Several days of dysuria  With blood noted in urine .  She tried Cystex and blood went away  No fever no CVA pain  Allergies  Allergen Reactions  . Dulcolax [Bisacodyl]     Dehydration   . Effexor [Venlafaxine]     Intolerant SSRI/SNRI  . Erythromycin Nausea And Vomiting  . Penicillins Hives  . Prednisone     Paranoid , anxious, hyper emotionally, could not sleep, odd dreams  . Tape Dermatitis  . Zithromax [Azithromycin] Rash   Past Medical History  Diagnosis Date  . Depression   . Arthritis     right knee  . Fibromyalgia   . Degenerative disc disease   . Fibromyalgia   . Plantar fasciitis of left foot   . Salmonella poisoning   . IBS (irritable bowel syndrome)   . Hyperlipidemia   . PVC (premature ventricular contraction)   . Family history of early CAD   . Lumbar radiculitis   . Allergy   . Anemia   . Anxiety    Past Surgical History  Procedure Laterality Date  . Knee arthroscopy    . Tonsillectomy    . Laproscopy    . Knee arthroscopy    . Colonoscopy     History   Social History  . Marital Status: Married    Spouse Name: N/A    Number of Children: 0  . Years of Education: N/A   Occupational History  . RN   .  [employer]   Social History Main Topics  . Smoking status: Never Smoker   . Smokeless tobacco: Never Used  . Alcohol Use: No  . Drug Use: No  . Sexual Activity: Yes    Birth Control/ Protection: Post-menopausal   Other Topics Concern  . Not on file   Social History Narrative   Family History  Problem Relation Age of Onset  . Colon cancer Father   . Esophageal cancer Father   . Cancer Father   . Stroke Father   . Heart disease Father   . Hypertension Father   . Hyperlipidemia Father   . Diabetes Father   . Prostate cancer Father   . Stomach cancer Father   . Heart disease Mother   .  Arthritis Mother   . Heart attack Mother   . Diabetes Maternal Grandfather   . Hypertension Brother   . Pancreatic cancer Neg Hx   . Rectal cancer Neg Hx    Patient Active Problem List   Diagnosis Date Noted  . Medication intolerance 07/04/2014  . Dyslipidemia 06/29/2014  . Family history of early CAD 06/29/2014  . Palpitations 06/29/2014  . Hyperlipidemia   . PVC (premature ventricular contraction)   . Low back pain 06/20/2014  . Right hip pain 02/15/2014  . Right shoulder injury 02/01/2014  . Salmonella 10/27/2013  . Breast density 03/17/2013  . Renal cyst, left 01/06/2013  . Family history of colon cancer 01/03/2013  . Elevated alkaline phosphatase measurement 01/03/2013  . Articular cartilage disorder of knee 12/24/2012  . Plantar fasciitis 07/21/2012  . Anxiety 07/02/2012  . History of anemia 07/01/2012  . Depression 07/01/2012  . Menopause 07/01/2012  . Menopausal hot flushes 07/01/2012  . DJD (degenerative joint disease) 07/01/2012  . Other and unspecified hyperlipidemia 07/01/2012  . Vaginal  dryness, menopausal 07/01/2012  . Urge incontinence of urine 07/01/2012  . Fibromyalgia   . Family history of malignant neoplasm 12/25/2011  . Personal history of colonic polyps 12/25/2011   Current Outpatient Prescriptions on File Prior to Visit  Medication Sig Dispense Refill  . acetic acid-hydrocortisone (VOSOL-HC) otic solution Place 4 drops into both ears daily. Use for one week and then off 3 weeks    . b complex vitamins tablet Take 1 tablet by mouth daily.    Marland Kitchen buPROPion (WELLBUTRIN XL) 300 MG 24 hr tablet Take 1 tablet (300 mg total) by mouth daily. 30 tablet 1  . Calcium Carbonate-Vitamin D (CALCIUM 500 + D) 500-125 MG-UNIT TABS Take 1 tablet by mouth daily.    . diclofenac (VOLTAREN) 75 MG EC tablet Take 75 mg by mouth 2 (two) times daily.    Marland Kitchen estradiol (ESTRACE) 1 MG tablet Take 1 tablet (1 mg total) by mouth daily. 30 tablet 3  . ferrous sulfate 325 (65 FE) MG  tablet Take 325 mg by mouth every Monday, Wednesday, and Friday.     . fish oil-omega-3 fatty acids 1000 MG capsule Take 1 g by mouth daily.    Marland Kitchen glucosamine-chondroitin 500-400 MG tablet Take 3 tablets by mouth daily.    . Multiple Vitamin (MULITIVITAMIN WITH MINERALS) TABS Take 1 tablet by mouth daily.    Marland Kitchen nystatin-triamcinolone ointment (MYCOLOG) Apply 1 application topically 2 (two) times daily. 30 g 0  . progesterone (PROMETRIUM) 100 MG capsule Take 1 capsule (100 mg total) by mouth daily. 90 capsule 1   No current facility-administered medications on file prior to visit.      Review of Systems See HPI    Objective:   Physical Exam Physical Exam  Nursing note and vitals reviewed.  Constitutional: She is oriented to person, place, and time. She appears well-developed and well-nourished.  HENT:  Head: Normocephalic and atraumatic.  Cardiovascular: Normal rate and regular rhythm. Exam reveals no gallop and no friction rub.  No murmur heard.  Pulmonary/Chest: Breath sounds normal. She has no wheezes. She has no rales.  ABD:  No CVA tenderness Neurological: She is alert and oriented to person, place, and time.  Skin: Skin is warm and dry.  Psychiatric: She has a normal mood and affect. Her behavior is normal.       Assessment & Plan:  UTI:    Will treat with Macrobid for 5 day course    Dysuria  See above

## 2014-11-04 LAB — URINE CULTURE
Colony Count: NO GROWTH
ORGANISM ID, BACTERIA: NO GROWTH

## 2014-11-08 ENCOUNTER — Telehealth: Payer: Self-pay | Admitting: *Deleted

## 2014-11-08 NOTE — Telephone Encounter (Signed)
I spoke with Megan Combs and set her up for a urine recheck in a couple weeks.

## 2014-11-08 NOTE — Telephone Encounter (Signed)
-----   Message from Lanice Shirts, MD sent at 11/06/2014  6:23 PM EST ----- Call pt and let her know that her urine culture showed no growth.  She can stop antibioitc if she is still taking it.   Advise her that I do want to see her in the office for another urine specimen to make sure there is no blood.  Ok to mail result to her

## 2014-11-23 ENCOUNTER — Ambulatory Visit (INDEPENDENT_AMBULATORY_CARE_PROVIDER_SITE_OTHER): Payer: BC Managed Care – PPO | Admitting: Internal Medicine

## 2014-11-23 ENCOUNTER — Encounter: Payer: Self-pay | Admitting: Internal Medicine

## 2014-11-23 VITALS — BP 128/70 | HR 87 | Resp 16 | Ht 66.0 in | Wt 161.0 lb

## 2014-11-23 DIAGNOSIS — L603 Nail dystrophy: Secondary | ICD-10-CM | POA: Diagnosis not present

## 2014-11-23 DIAGNOSIS — R319 Hematuria, unspecified: Secondary | ICD-10-CM | POA: Diagnosis not present

## 2014-11-23 DIAGNOSIS — Z87440 Personal history of urinary (tract) infections: Secondary | ICD-10-CM

## 2014-11-23 LAB — POCT URINALYSIS DIPSTICK
BILIRUBIN UA: NEGATIVE
Blood, UA: NEGATIVE
Glucose, UA: NEGATIVE
Ketones, UA: NEGATIVE
Leukocytes, UA: NEGATIVE
NITRITE UA: NEGATIVE
Protein, UA: NEGATIVE
Spec Grav, UA: 1.01
Urobilinogen, UA: NEGATIVE
pH, UA: 6.5

## 2014-11-23 NOTE — Progress Notes (Signed)
Subjective:    Patient ID: Megan Combs, female    DOB: 15-Nov-1960, 54 y.o.   MRN: [id number]  HPI Here for follow up  No urinary synptoms  Culture neg Left great toe may have fungal infection     Allergies  Allergen Reactions  . Dulcolax [Bisacodyl]     Dehydration   . Effexor [Venlafaxine]     Intolerant SSRI/SNRI  . Erythromycin Nausea And Vomiting  . Penicillins Hives  . Prednisone     Paranoid , anxious, hyper emotionally, could not sleep, odd dreams  . Tape Dermatitis  . Zithromax [Azithromycin] Rash   Past Medical History  Diagnosis Date  . Depression   . Arthritis     right knee  . Fibromyalgia   . Degenerative disc disease   . Fibromyalgia   . Plantar fasciitis of left foot   . Salmonella poisoning   . IBS (irritable bowel syndrome)   . Hyperlipidemia   . PVC (premature ventricular contraction)   . Family history of early CAD   . Lumbar radiculitis   . Allergy   . Anemia   . Anxiety    Past Surgical History  Procedure Laterality Date  . Knee arthroscopy    . Tonsillectomy    . Laproscopy    . Knee arthroscopy    . Colonoscopy     History   Social History  . Marital Status: Married    Spouse Name: N/A    Number of Children: 0  . Years of Education: N/A   Occupational History  . RN   .  [employer]   Social History Main Topics  . Smoking status: Never Smoker   . Smokeless tobacco: Never Used  . Alcohol Use: No  . Drug Use: No  . Sexual Activity: Yes    Birth Control/ Protection: Post-menopausal   Other Topics Concern  . Not on file   Social History Narrative   Family History  Problem Relation Age of Onset  . Colon cancer Father   . Esophageal cancer Father   . Cancer Father   . Stroke Father   . Heart disease Father   . Hypertension Father   . Hyperlipidemia Father   . Diabetes Father   . Prostate cancer Father   . Stomach cancer Father   . Heart disease Mother   . Arthritis Mother   . Heart attack Mother     . Diabetes Maternal Grandfather   . Hypertension Brother   . Pancreatic cancer Neg Hx   . Rectal cancer Neg Hx    Patient Active Problem List   Diagnosis Date Noted  . Medication intolerance 07/04/2014  . Dyslipidemia 06/29/2014  . Family history of early CAD 06/29/2014  . Palpitations 06/29/2014  . Hyperlipidemia   . PVC (premature ventricular contraction)   . Low back pain 06/20/2014  . Right hip pain 02/15/2014  . Right shoulder injury 02/01/2014  . Salmonella 10/27/2013  . Breast density 03/17/2013  . Renal cyst, left 01/06/2013  . Family history of colon cancer 01/03/2013  . Elevated alkaline phosphatase measurement 01/03/2013  . Articular cartilage disorder of knee 12/24/2012  . Plantar fasciitis 07/21/2012  . Anxiety 07/02/2012  . History of anemia 07/01/2012  . Depression 07/01/2012  . Menopause 07/01/2012  . Menopausal hot flushes 07/01/2012  . DJD (degenerative joint disease) 07/01/2012  . Other and unspecified hyperlipidemia 07/01/2012  . Vaginal dryness, menopausal 07/01/2012  . Urge incontinence of urine 07/01/2012  .  Fibromyalgia   . Family history of malignant neoplasm 12/25/2011  . Personal history of colonic polyps 12/25/2011   Current Outpatient Prescriptions on File Prior to Visit  Medication Sig Dispense Refill  . acetic acid-hydrocortisone (VOSOL-HC) otic solution Place 4 drops into both ears daily. Use for one week and then off 3 weeks    . b complex vitamins tablet Take 1 tablet by mouth daily.    Marland Kitchen buPROPion (WELLBUTRIN XL) 300 MG 24 hr tablet Take 1 tablet (300 mg total) by mouth daily. 30 tablet 1  . Calcium Carbonate-Vitamin D (CALCIUM 500 + D) 500-125 MG-UNIT TABS Take 1 tablet by mouth daily.    . diclofenac (VOLTAREN) 75 MG EC tablet Take 75 mg by mouth 2 (two) times daily.    Marland Kitchen estradiol (ESTRACE) 1 MG tablet Take 1 tablet (1 mg total) by mouth daily. 30 tablet 3  . ferrous sulfate 325 (65 FE) MG tablet Take 325 mg by mouth every Monday,  Wednesday, and Friday.     . fish oil-omega-3 fatty acids 1000 MG capsule Take 1 g by mouth daily.    Marland Kitchen glucosamine-chondroitin 500-400 MG tablet Take 3 tablets by mouth daily.    . Multiple Vitamin (MULITIVITAMIN WITH MINERALS) TABS Take 1 tablet by mouth daily.    Marland Kitchen nystatin-triamcinolone ointment (MYCOLOG) Apply 1 application topically 2 (two) times daily. 30 g 0  . progesterone (PROMETRIUM) 100 MG capsule Take 1 capsule (100 mg total) by mouth daily. 90 capsule 1  . STRATTERA 25 MG capsule   0   No current facility-administered medications on file prior to visit.       Review of Systems ee hPI     Objective:   Physical Exam Physical Exam  Nursing note and vitals reviewed.  Constitutional: She is oriented to person, place, and time. She appears well-developed and well-nourished.  HENT:  Head: Normocephalic and atraumatic.  Cardiovascular: Normal rate and regular rhythm. Exam reveals no gallop and no friction rub.  No murmur heard.  Pulmonary/Chest: Breath sounds normal. She has no wheezes. She has no rales.  Neurological: She is alert and oriented to person, place, and time.  Skin: Skin is warm and dry. Left great toenaill hyeprtrophied at corner    Psychiatric: She has a normal mood and affect. Her behavior is normal.             Assessment & Plan:  hematauria  Subjective  U/A neg today  Nail dystrophy suspect fungal gave pt number to derm and she will call for appt

## 2014-12-02 ENCOUNTER — Other Ambulatory Visit: Payer: Self-pay | Admitting: *Deleted

## 2014-12-02 MED ORDER — PROGESTERONE MICRONIZED 100 MG PO CAPS: 100.0000 mg | ORAL_CAPSULE | Freq: Every day | ORAL | Status: DC

## 2014-12-07 NOTE — Telephone Encounter (Signed)
Pt called stating she has not rcvd refill on Progesterone. I checked on this and called her back. LMOM stating meds at pharmacy 12/02/14.

## 2014-12-13 ENCOUNTER — Ambulatory Visit (INDEPENDENT_AMBULATORY_CARE_PROVIDER_SITE_OTHER): Payer: 59 | Admitting: Internal Medicine

## 2014-12-13 ENCOUNTER — Encounter: Payer: Self-pay | Admitting: Internal Medicine

## 2014-12-13 VITALS — BP 117/78 | HR 92 | Resp 16 | Ht 66.5 in | Wt 160.0 lb

## 2014-12-13 DIAGNOSIS — W5503XA Scratched by cat, initial encounter: Secondary | ICD-10-CM

## 2014-12-13 DIAGNOSIS — L03113 Cellulitis of right upper limb: Secondary | ICD-10-CM

## 2014-12-13 DIAGNOSIS — S60511A Abrasion of right hand, initial encounter: Secondary | ICD-10-CM

## 2014-12-13 MED ORDER — SULFAMETHOXAZOLE-TRIMETHOPRIM 800-160 MG PO TABS: 1.0000 | ORAL_TABLET | Freq: Two times a day (BID) | ORAL | Status: DC

## 2014-12-13 NOTE — Progress Notes (Signed)
Subjective:    Patient ID: Megan Combs, female    DOB: May 08, 1961, 54 y.o.   MRN: [id number]  HPI  [name] is her for acute visit  One week ago was playing with her cat who scratched her right hand.   No cat bite Tried topical treatment but area is still red along excoriations.   No lymphadenopathy noted by pt  No fever or rash noted  Allergies  Allergen Reactions  . Dulcolax [Bisacodyl]     Dehydration   . Effexor [Venlafaxine]     Intolerant SSRI/SNRI  . Erythromycin Nausea And Vomiting  . Penicillins Hives  . Prednisone     Paranoid , anxious, hyper emotionally, could not sleep, odd dreams  . Tape Dermatitis  . Zithromax [Azithromycin] Rash   Past Medical History  Diagnosis Date  . Depression   . Arthritis     right knee  . Fibromyalgia   . Degenerative disc disease   . Fibromyalgia   . Plantar fasciitis of left foot   . Salmonella poisoning   . IBS (irritable bowel syndrome)   . Hyperlipidemia   . PVC (premature ventricular contraction)   . Family history of early CAD   . Lumbar radiculitis   . Allergy   . Anemia   . Anxiety    Past Surgical History  Procedure Laterality Date  . Knee arthroscopy    . Tonsillectomy    . Laproscopy    . Knee arthroscopy    . Colonoscopy     History   Social History  . Marital Status: Married    Spouse Name: N/A  . Number of Children: 0  . Years of Education: N/A   Occupational History  . RN   .  [employer]   Social History Main Topics  . Smoking status: Never Smoker   . Smokeless tobacco: Never Used  . Alcohol Use: No  . Drug Use: No  . Sexual Activity: Yes    Birth Control/ Protection: Post-menopausal   Other Topics Concern  . Not on file   Social History Narrative   Family History  Problem Relation Age of Onset  . Colon cancer Father   . Esophageal cancer Father   . Cancer Father   . Stroke Father   . Heart disease Father   . Hypertension Father   . Hyperlipidemia Father   . Diabetes  Father   . Prostate cancer Father   . Stomach cancer Father   . Heart disease Mother   . Arthritis Mother   . Heart attack Mother   . Diabetes Maternal Grandfather   . Hypertension Brother   . Pancreatic cancer Neg Hx   . Rectal cancer Neg Hx    Patient Active Problem List   Diagnosis Date Noted  . Medication intolerance 07/04/2014  . Dyslipidemia 06/29/2014  . Family history of early CAD 06/29/2014  . Palpitations 06/29/2014  . Hyperlipidemia   . PVC (premature ventricular contraction)   . Low back pain 06/20/2014  . Right hip pain 02/15/2014  . Right shoulder injury 02/01/2014  . Salmonella 10/27/2013  . Breast density 03/17/2013  . Renal cyst, left 01/06/2013  . Family history of colon cancer 01/03/2013  . Elevated alkaline phosphatase measurement 01/03/2013  . Articular cartilage disorder of knee 12/24/2012  . Plantar fasciitis 07/21/2012  . Anxiety 07/02/2012  . History of anemia 07/01/2012  . Depression 07/01/2012  . Menopause 07/01/2012  . Menopausal hot flushes 07/01/2012  .  DJD (degenerative joint disease) 07/01/2012  . Other and unspecified hyperlipidemia 07/01/2012  . Vaginal dryness, menopausal 07/01/2012  . Urge incontinence of urine 07/01/2012  . Fibromyalgia   . Family history of malignant neoplasm 12/25/2011  . Personal history of colonic polyps 12/25/2011   Current Outpatient Prescriptions on File Prior to Visit  Medication Sig Dispense Refill  . acetic acid-hydrocortisone (VOSOL-HC) otic solution Place 4 drops into both ears daily. Use for one week and then off 3 weeks    . b complex vitamins tablet Take 1 tablet by mouth daily.    Marland Kitchen buPROPion (WELLBUTRIN XL) 300 MG 24 hr tablet Take 1 tablet (300 mg total) by mouth daily. 30 tablet 1  . Calcium Carbonate-Vitamin D (CALCIUM 500 + D) 500-125 MG-UNIT TABS Take 1 tablet by mouth daily.    . diclofenac (VOLTAREN) 75 MG EC tablet Take 75 mg by mouth 2 (two) times daily.    Marland Kitchen estradiol (ESTRACE) 1 MG  tablet Take 1 tablet (1 mg total) by mouth daily. 30 tablet 3  . ferrous sulfate 325 (65 FE) MG tablet Take 325 mg by mouth every Monday, Wednesday, and Friday.     . fish oil-omega-3 fatty acids 1000 MG capsule Take 1 g by mouth daily.    Marland Kitchen glucosamine-chondroitin 500-400 MG tablet Take 3 tablets by mouth daily.    . Multiple Vitamin (MULITIVITAMIN WITH MINERALS) TABS Take 1 tablet by mouth daily.    Marland Kitchen nystatin-triamcinolone ointment (MYCOLOG) Apply 1 application topically 2 (two) times daily. 30 g 0  . progesterone (PROMETRIUM) 100 MG capsule Take 1 capsule (100 mg total) by mouth daily. 90 capsule 0  . STRATTERA 25 MG capsule   0   No current facility-administered medications on file prior to visit.      Review of Systems See HPI    Objective:   Physical Exam Physical Exam  Nursing note and vitals reviewed.  Constitutional: She is oriented to person, place, and time. She appears well-developed and well-nourished.  HENT:  Head: Normocephalic and atraumatic.  Cardiovascular: Normal rate and regular rhythm. Exam reveals no gallop and no friction rub.  No murmur heard.  Pulmonary/Chest: Breath sounds normal. She has no wheezes. She has no rales.  Neurological: She is alert and oriented to person, place, and time.  Skin: Skin is warm and dry.  R hand  Linear excoriations near thumb.  No puncture wound noted Mild cellulitis N-V intact Good radial pulse Sensory intact to microfilament Good apposition of thumb to all finger  Psychiatric: She has a normal mood and affect. Her behavior is normal.              Assessment & Plan:  Cat scratch  Pt allergic to mycins  Will give 7 day course of Septra  Mild cellulitis  See above  See me if not better.

## 2014-12-23 ENCOUNTER — Emergency Department (HOSPITAL_BASED_OUTPATIENT_CLINIC_OR_DEPARTMENT_OTHER)
Admission: EM | Admit: 2014-12-23 | Discharge: 2014-12-23 | Disposition: A | Discharge status: Home / Self Care | Payer: 59 | Attending: Emergency Medicine | Admitting: Emergency Medicine

## 2014-12-23 ENCOUNTER — Encounter (HOSPITAL_BASED_OUTPATIENT_CLINIC_OR_DEPARTMENT_OTHER): Payer: Self-pay | Admitting: Emergency Medicine

## 2014-12-23 DIAGNOSIS — E860 Dehydration: Secondary | ICD-10-CM | POA: Diagnosis not present

## 2014-12-23 DIAGNOSIS — M797 Fibromyalgia: Secondary | ICD-10-CM | POA: Diagnosis not present

## 2014-12-23 DIAGNOSIS — D649 Anemia, unspecified: Secondary | ICD-10-CM | POA: Diagnosis not present

## 2014-12-23 DIAGNOSIS — Z8639 Personal history of other endocrine, nutritional and metabolic disease: Secondary | ICD-10-CM | POA: Insufficient documentation

## 2014-12-23 DIAGNOSIS — F329 Major depressive disorder, single episode, unspecified: Secondary | ICD-10-CM | POA: Diagnosis not present

## 2014-12-23 DIAGNOSIS — K529 Noninfective gastroenteritis and colitis, unspecified: Secondary | ICD-10-CM

## 2014-12-23 DIAGNOSIS — Z79899 Other long term (current) drug therapy: Secondary | ICD-10-CM | POA: Insufficient documentation

## 2014-12-23 DIAGNOSIS — Z880 Allergy status to penicillin: Secondary | ICD-10-CM | POA: Diagnosis not present

## 2014-12-23 DIAGNOSIS — Z8679 Personal history of other diseases of the circulatory system: Secondary | ICD-10-CM | POA: Diagnosis not present

## 2014-12-23 DIAGNOSIS — F419 Anxiety disorder, unspecified: Secondary | ICD-10-CM | POA: Insufficient documentation

## 2014-12-23 DIAGNOSIS — M1990 Unspecified osteoarthritis, unspecified site: Secondary | ICD-10-CM | POA: Diagnosis not present

## 2014-12-23 DIAGNOSIS — R1114 Bilious vomiting: Secondary | ICD-10-CM

## 2014-12-23 DIAGNOSIS — Z8619 Personal history of other infectious and parasitic diseases: Secondary | ICD-10-CM | POA: Insufficient documentation

## 2014-12-23 DIAGNOSIS — Z791 Long term (current) use of non-steroidal anti-inflammatories (NSAID): Secondary | ICD-10-CM | POA: Diagnosis not present

## 2014-12-23 DIAGNOSIS — R1110 Vomiting, unspecified: Secondary | ICD-10-CM | POA: Diagnosis present

## 2014-12-23 LAB — COMPREHENSIVE METABOLIC PANEL
ALBUMIN: 3.9 g/dL (ref 3.5–5.2)
ALT: 16 U/L (ref 0–35)
AST: 30 U/L (ref 0–37)
Alkaline Phosphatase: 105 U/L (ref 39–117)
Anion gap: 6 (ref 5–15)
BUN: 29 mg/dL — ABNORMAL HIGH (ref 6–23)
CALCIUM: 8.9 mg/dL (ref 8.4–10.5)
CHLORIDE: 107 mmol/L (ref 96–112)
CO2: 22 mmol/L (ref 19–32)
Creatinine, Ser: 0.72 mg/dL (ref 0.50–1.10)
GFR calc Af Amer: >90 mL/min (ref >90)
GFR calc non Af Amer: >90 mL/min (ref >90)
Glucose, Bld: 157 mg/dL — ABNORMAL HIGH (ref 70–99)
Potassium: 4.1 mmol/L (ref 3.5–5.1)
SODIUM: 135 mmol/L (ref 135–145)
TOTAL PROTEIN: 7.2 g/dL (ref 6.0–8.3)
Total Bilirubin: 0.4 mg/dL (ref 0.3–1.2)

## 2014-12-23 LAB — CBC WITH DIFFERENTIAL/PLATELET
BASOS ABS: 0 10*3/uL (ref 0.0–0.1)
Basophils Relative: 0 % (ref 0–1)
EOS PCT: 1 % (ref 0–5)
Eosinophils Absolute: 0.1 10*3/uL (ref 0.0–0.7)
HEMATOCRIT: 41.7 % (ref 36.0–46.0)
Hemoglobin: 14.3 g/dL (ref 12.0–15.0)
Lymphocytes Relative: 6 % — ABNORMAL LOW (ref 12–46)
Lymphs Abs: 0.7 10*3/uL (ref 0.7–4.0)
MCH: 30.6 pg (ref 26.0–34.0)
MCHC: 34.3 g/dL (ref 30.0–36.0)
MCV: 89.1 fL (ref 78.0–100.0)
MONOS PCT: 3 % (ref 3–12)
Monocytes Absolute: 0.3 10*3/uL (ref 0.1–1.0)
Neutro Abs: 10.2 10*3/uL — ABNORMAL HIGH (ref 1.7–7.7)
Neutrophils Relative %: 90 % — ABNORMAL HIGH (ref 43–77)
Platelets: 198 10*3/uL (ref 150–400)
RBC: 4.68 MIL/uL (ref 3.87–5.11)
RDW: 12.1 % (ref 11.5–15.5)
WBC: 11.3 10*3/uL — AB (ref 4.0–10.5)

## 2014-12-23 LAB — LIPASE, BLOOD: Lipase: 23 U/L (ref 11–59)

## 2014-12-23 MED ORDER — ONDANSETRON 8 MG PO TBDP: 8.0000 mg | ORAL_TABLET | Freq: Three times a day (TID) | ORAL | Status: DC | PRN

## 2014-12-23 MED ORDER — ONDANSETRON HCL 4 MG/2ML IJ SOLN
INTRAMUSCULAR | Status: AC
  Administered 2014-12-23: 4 mg via INTRAVENOUS
  Filled 2014-12-23: qty 2

## 2014-12-23 MED ORDER — PROMETHAZINE HCL 25 MG RE SUPP: 25.0000 mg | Freq: Four times a day (QID) | RECTAL | Status: DC | PRN

## 2014-12-23 MED ORDER — SODIUM CHLORIDE 0.9 % IV SOLN
1000.0000 mL | Freq: Once | INTRAVENOUS | Status: AC
  Administered 2014-12-23: 1000 mL via INTRAVENOUS

## 2014-12-23 MED ORDER — ONDANSETRON 4 MG PO TBDP
4.0000 mg | ORAL_TABLET | Freq: Once | ORAL | Status: AC
  Administered 2014-12-23: 4 mg via ORAL
  Filled 2014-12-23: qty 1

## 2014-12-23 MED ORDER — ONDANSETRON HCL 4 MG/2ML IJ SOLN
4.0000 mg | Freq: Once | INTRAMUSCULAR | Status: AC
  Administered 2014-12-23: 4 mg via INTRAVENOUS

## 2014-12-23 NOTE — ED Notes (Signed)
Patient states that she has been throwing up since about 830.

## 2014-12-23 NOTE — ED Notes (Addendum)
Alert, NAD, calm, interactive, resps e/u, speaking in clear complete sentences, VSS, no dyspnea noted, skin W&D, "feeling better", (denies, at this time: pain, nausea, dizziness or other sx). 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 Developed sx after work and after [employer]. "not sure if r/t work or food". Mentions h/o IBS and salmonella poisoning. Husband at [employer] [facility] [employer] [employer] [facility] [employer].

## 2014-12-23 NOTE — ED Notes (Signed)
Pt sleeping, NAD, calm, VSS, tolerated PO fluids.

## 2014-12-23 NOTE — ED Notes (Signed)
Pt up to b/r, steady gait, NAD, calm, "feels better".

## 2014-12-23 NOTE — ED Provider Notes (Signed)
CSN: [id number]     Arrival date & time 12/23/14  0012 History   First MD Initiated Contact with Patient 12/23/14 0231     Chief Complaint  Patient presents with  . Emesis     (Consider location/radiation/quality/duration/timing/severity/associated sxs/prior Treatment) HPI Comments: PT comes in with cc of emesis. PT reports that she started feeling sick early part of the evening, late afternoon. She was feeling nauseated only. Around 8:30, she started having emesis. She had about 6 episodes of emesis, bilious at the end. There is hx of IBS. No sick contacts, and no suspicious po intake. Last BM was in the morning, and was normal. Pt later on started feeling diophoretic and dizzy, and decided to come to the ER. + hx of diagnostic laparoscopy, no other surgeries.  Patient is a 54 y.o. female presenting with vomiting. The history is provided by the patient.  Emesis Associated symptoms: no abdominal pain, no diarrhea and no headaches     Past Medical History  Diagnosis Date  . Depression   . Arthritis     right knee  . Fibromyalgia   . Degenerative disc disease   . Fibromyalgia   . Plantar fasciitis of left foot   . Salmonella poisoning   . IBS (irritable bowel syndrome)   . Hyperlipidemia   . PVC (premature ventricular contraction)   . Family history of early CAD   . Lumbar radiculitis   . Allergy   . Anemia   . Anxiety    Past Surgical History  Procedure Laterality Date  . Knee arthroscopy    . Tonsillectomy    . Laproscopy    . Knee arthroscopy    . Colonoscopy     Family History  Problem Relation Age of Onset  . Colon cancer Father   . Esophageal cancer Father   . Cancer Father   . Stroke Father   . Heart disease Father   . Hypertension Father   . Hyperlipidemia Father   . Diabetes Father   . Prostate cancer Father   . Stomach cancer Father   . Heart disease Mother   . Arthritis Mother   . Heart attack Mother   . Diabetes Maternal Grandfather   .  Hypertension Brother   . Pancreatic cancer Neg Hx   . Rectal cancer Neg Hx    History  Substance Use Topics  . Smoking status: Never Smoker   . Smokeless tobacco: Never Used  . Alcohol Use: No   OB History    Gravida Para Term Preterm AB TAB SAB Ectopic Multiple Living   2    1   1   0     Review of Systems  Constitutional: Positive for activity change.  Respiratory: Negative for shortness of breath.   Cardiovascular: Negative for chest pain.  Gastrointestinal: Positive for nausea and vomiting. Negative for abdominal pain, diarrhea and constipation.  Genitourinary: Negative for dysuria.  Musculoskeletal: Negative for neck pain.  Neurological: Positive for dizziness. Negative for syncope and headaches.  All other systems reviewed and are negative.     Allergies  Dulcolax; Effexor; Erythromycin; Penicillins; Prednisone; Tape; and Zithromax  Home Medications   Prior to Admission medications   Medication Sig Start Date End Date Taking? Authorizing Provider  acetic acid-hydrocortisone (VOSOL-HC) otic solution Place 4 drops into both ears daily. Use for one week and then off 3 weeks    Historical Provider, MD  b complex vitamins tablet Take 1 tablet by mouth daily.  Historical Provider, MD  buPROPion (WELLBUTRIN XL) 300 MG 24 hr tablet Take 1 tablet (300 mg total) by mouth daily. 05/25/13   Lanice Shirts, MD  Calcium Carbonate-Vitamin D (CALCIUM 500 + D) 500-125 MG-UNIT TABS Take 1 tablet by mouth daily.    Historical Provider, MD  diclofenac (VOLTAREN) 75 MG EC tablet Take 75 mg by mouth 2 (two) times daily.    Historical Provider, MD  estradiol (ESTRACE) 1 MG tablet Take 1 tablet (1 mg total) by mouth daily. 09/08/14   Lanice Shirts, MD  ferrous sulfate 325 (65 FE) MG tablet Take 325 mg by mouth every Monday, Wednesday, and Friday.     Historical Provider, MD  fish oil-omega-3 fatty acids 1000 MG capsule Take 1 g by mouth daily.    Historical Provider, MD   glucosamine-chondroitin 500-400 MG tablet Take 3 tablets by mouth daily.    Historical Provider, MD  Multiple Vitamin (MULITIVITAMIN WITH MINERALS) TABS Take 1 tablet by mouth daily.    Historical Provider, MD  nystatin-triamcinolone ointment (MYCOLOG) Apply 1 application topically 2 (two) times daily. 10/04/14   Lanice Shirts, MD  ondansetron (ZOFRAN ODT) 8 MG disintegrating tablet Take 1 tablet (8 mg total) by mouth every 8 (eight) hours as needed for nausea. 12/23/14   Varney Biles, MD  progesterone (PROMETRIUM) 100 MG capsule Take 1 capsule (100 mg total) by mouth daily. 12/02/14   Lanice Shirts, MD  promethazine (PHENERGAN) 25 MG suppository Place 1 suppository (25 mg total) rectally every 6 (six) hours as needed for nausea. 12/23/14   Varney Biles, MD  STRATTERA 25 MG capsule  10/23/14   Historical Provider, MD  sulfamethoxazole-trimethoprim (BACTRIM DS,SEPTRA DS) 800-160 MG per tablet Take 1 tablet by mouth 2 (two) times daily. 12/13/14   Lanice Shirts, MD   BP 119/71 mmHg  Pulse 92  Temp(Src) 98.1 F (36.7 C) (Oral)  Resp 11  Wt 160 lb (72.576 kg)  SpO2 99%  LMP 10/28/2008 Physical Exam  Constitutional: She is oriented to person, place, and time. She appears well-developed and well-nourished.  HENT:  Head: Normocephalic and atraumatic.  Eyes: EOM are normal. Pupils are equal, round, and reactive to light.  Neck: Neck supple.  Cardiovascular: Normal rate, regular rhythm and normal heart sounds.   No murmur heard. Pulmonary/Chest: Effort normal. No respiratory distress.  Abdominal: Soft. She exhibits no distension. There is no tenderness. There is no rebound and no guarding.  Neurological: She is alert and oriented to person, place, and time.  Skin: Skin is warm and dry.  Nursing note and vitals reviewed.   ED Course  Procedures (including critical care time) Labs Review Labs Reviewed  CBC WITH DIFFERENTIAL/PLATELET - Abnormal; Notable for the following:     WBC 11.3 (*)    Neutrophils Relative % 90 (*)    Neutro Abs 10.2 (*)    Lymphocytes Relative 6 (*)    All other components within normal limits  COMPREHENSIVE METABOLIC PANEL - Abnormal; Notable for the following:    Glucose, Bld 157 (*)    BUN 29 (*)    All other components within normal limits  LIPASE, BLOOD    Imaging Review No results found.   EKG Interpretation None      MDM   Final diagnoses:  Gastroenteritis  Dehydration  Bilious vomiting with nausea    Pt comes in with cc of nausea and emesis. No abd pain, no diarrhea, passing flatus. Pt was dizzy and at  arrival noted to have BP of 90/74. She was initiated on ivf. Pt is now feeling better. She has not had any emesis since ER arrival. PO challnge started, and patient did well. VSS and reassuring. Advised to f/u with pcp given hx of ibs.  Varney Biles, MD 12/23/14 [phone]

## 2014-12-23 NOTE — ED Notes (Signed)
Report received from [facility], RNs.

## 2014-12-23 NOTE — ED Notes (Signed)
Offered ginger-ale fluid challenge.

## 2014-12-23 NOTE — Discharge Instructions (Signed)
Please take the meds as needed for nausea. Return to the ER if the symptoms get worse, you have severe abdominal pain.   Dehydration, Adult Dehydration is when you lose more fluids from the body than you take in. Vital organs like the kidneys, brain, and heart cannot function without a proper amount of fluids and salt. Any loss of fluids from the body can cause dehydration.  CAUSES   Vomiting.  Diarrhea.  Excessive sweating.  Excessive urine output.  Fever. SYMPTOMS  Mild dehydration  Thirst.  Dry lips.  Slightly dry mouth. Moderate dehydration  Very dry mouth.  Sunken eyes.  Skin does not bounce back quickly when lightly pinched and released.  Dark urine and decreased urine production.  Decreased tear production.  Headache. Severe dehydration  Very dry mouth.  Extreme thirst.  Rapid, weak pulse (more than 100 beats per minute at rest).  Cold hands and feet.  Not able to sweat in spite of heat and temperature.  Rapid breathing.  Blue lips.  Confusion and lethargy.  Difficulty being awakened.  Minimal urine production.  No tears. DIAGNOSIS  Your caregiver will diagnose dehydration based on your symptoms and your exam. Blood and urine tests will help confirm the diagnosis. The diagnostic evaluation should also identify the cause of dehydration. TREATMENT  Treatment of mild or moderate dehydration can often be done at home by increasing the amount of fluids that you drink. It is best to drink small amounts of fluid more often. Drinking too much at one time can make vomiting worse. Refer to the home care instructions below. Severe dehydration needs to be treated at the hospital where you will probably be given intravenous (IV) fluids that contain water and electrolytes. HOME CARE INSTRUCTIONS   Ask your caregiver about specific rehydration instructions.  Drink enough fluids to keep your urine clear or pale yellow.  Drink small amounts frequently if  you have nausea and vomiting.  Eat as you normally do.  Avoid:  Foods or drinks high in sugar.  Carbonated drinks.  Juice.  Extremely hot or cold fluids.  Drinks with caffeine.  Fatty, greasy foods.  Alcohol.  Tobacco.  Overeating.  Gelatin desserts.  Wash your hands well to avoid spreading bacteria and viruses.  Only take over-the-counter or prescription medicines for pain, discomfort, or fever as directed by your caregiver.  Ask your caregiver if you should continue all prescribed and over-the-counter medicines.  Keep all follow-up appointments with your caregiver. SEEK MEDICAL CARE IF:  You have abdominal pain and it increases or stays in one area (localizes).  You have a rash, stiff neck, or severe headache.  You are irritable, sleepy, or difficult to awaken.  You are weak, dizzy, or extremely thirsty. SEEK IMMEDIATE MEDICAL CARE IF:   You are unable to keep fluids down or you get worse despite treatment.  You have frequent episodes of vomiting or diarrhea.  You have blood or green matter (bile) in your vomit.  You have blood in your stool or your stool looks black and tarry.  You have not urinated in 6 to 8 hours, or you have only urinated a small amount of very dark urine.  You have a fever.  You faint. MAKE SURE YOU:   Understand these instructions.  Will watch your condition.  Will get help right away if you are not doing well or get worse. Document Released: 10/14/2005 Document Revised: 01/06/2012 Document Reviewed: 06/03/2011 [facility] Patient Information 2015 [place], [place]. This information is not  intended to replace advice given to you by your health care provider. Make sure you discuss any questions you have with your health care provider.  Viral Gastroenteritis Viral gastroenteritis is also known as stomach flu. This condition affects the stomach and intestinal tract. It can cause sudden diarrhea and vomiting. The illness typically  lasts 3 to 8 days. Most people develop an immune response that eventually gets rid of the virus. While this natural response develops, the virus can make you quite ill. CAUSES  Many different viruses can cause gastroenteritis, such as rotavirus or noroviruses. You can catch one of these viruses by consuming contaminated food or water. You may also catch a virus by sharing utensils or other personal items with an infected person or by touching a contaminated surface. SYMPTOMS  The most common symptoms are diarrhea and vomiting. These problems can cause a severe loss of body fluids (dehydration) and a body salt (electrolyte) imbalance. Other symptoms may include:  Fever.  Headache.  Fatigue.  Abdominal pain. DIAGNOSIS  Your caregiver can usually diagnose viral gastroenteritis based on your symptoms and a physical exam. A stool sample may also be taken to test for the presence of viruses or other infections. TREATMENT  This illness typically goes away on its own. Treatments are aimed at rehydration. The most serious cases of viral gastroenteritis involve vomiting so severely that you are not able to keep fluids down. In these cases, fluids must be given through an intravenous line (IV). HOME CARE INSTRUCTIONS   Drink enough fluids to keep your urine clear or pale yellow. Drink small amounts of fluids frequently and increase the amounts as tolerated.  Ask your caregiver for specific rehydration instructions.  Avoid:  Foods high in sugar.  Alcohol.  Carbonated drinks.  Tobacco.  Juice.  Caffeine drinks.  Extremely hot or cold fluids.  Fatty, greasy foods.  Too much intake of anything at one time.  Dairy products until 24 to 48 hours after diarrhea stops.  You may consume probiotics. Probiotics are active cultures of beneficial bacteria. They may lessen the amount and number of diarrheal stools in adults. Probiotics can be found in yogurt with active cultures and in  supplements.  Wash your hands well to avoid spreading the virus.  Only take over-the-counter or prescription medicines for pain, discomfort, or fever as directed by your caregiver. Do not give aspirin to children. Antidiarrheal medicines are not recommended.  Ask your caregiver if you should continue to take your regular prescribed and over-the-counter medicines.  Keep all follow-up appointments as directed by your caregiver. SEEK IMMEDIATE MEDICAL CARE IF:   You are unable to keep fluids down.  You do not urinate at least once every 6 to 8 hours.  You develop shortness of breath.  You notice blood in your stool or vomit. This may look like coffee grounds.  You have abdominal pain that increases or is concentrated in one small area (localized).  You have persistent vomiting or diarrhea.  You have a fever.  The patient is a child younger than 3 months, and he or she has a fever.  The patient is a child older than 3 months, and he or she has a fever and persistent symptoms.  The patient is a child older than 3 months, and he or she has a fever and symptoms suddenly get worse.  The patient is a baby, and he or she has no tears when crying. MAKE SURE YOU:   Understand these instructions.  Will  watch your condition.  Will get help right away if you are not doing well or get worse. Document Released: 10/14/2005 Document Revised: 01/06/2012 Document Reviewed: 07/31/2011 [facility] Patient Information 2015 [place], [place]. This information is not intended to replace advice given to you by your health care provider. Make sure you discuss any questions you have with your health care provider.  Clear Liquid Diet A clear liquid diet is a short-term diet that is prescribed to provide the necessary fluid and basic energy you need when you can have nothing else. The clear liquid diet consists of liquids or solids that will become liquid at room temperature. You should be able to see through  the liquid. There are many reasons that you may be restricted to clear liquids, such as:  When you have a sudden-onset (acute) condition that occurs before or after surgery.  To help your body slowly get adjusted to food again after a long period when you were unable to have food.  Replacement of fluids when you have a diarrheal disease.  When you are going to have certain exams, such as a colonoscopy, in which instruments are inserted inside your body to look at parts of your digestive system. WHAT CAN I HAVE? A clear liquid diet does not provide all the nutrients you need. It is important to choose a variety of the following items to get as many nutrients as possible:  Vegetable juices that do not have pulp.  Fruit juices and fruit drinks that do not have pulp.  Coffee (regular or decaffeinated), tea, or soda at the discretion of your health care provider.  Clear bouillon, broth, or strained broth-based soups.  High-protein and flavored gelatins.  Sugar or honey.  Ices or frozen ice pops that do not contain milk. If you are not sure whether you can have certain items, you should ask your health care provider. You may also ask your health care provider if there are any other clear liquid options. Document Released: 10/14/2005 Document Revised: 10/19/2013 Document Reviewed: 09/10/2013 [facility] Patient Information 2015 [place], [place]. This information is not intended to replace advice given to you by your health care provider. Make sure you discuss any questions you have with your health care provider.

## 2014-12-29 ENCOUNTER — Ambulatory Visit (INDEPENDENT_AMBULATORY_CARE_PROVIDER_SITE_OTHER): Payer: 59 | Admitting: Internal Medicine

## 2014-12-29 ENCOUNTER — Encounter: Payer: Self-pay | Admitting: *Deleted

## 2014-12-29 ENCOUNTER — Encounter: Payer: Self-pay | Admitting: Internal Medicine

## 2014-12-29 VITALS — BP 115/84 | HR 84 | Resp 16 | Ht 66.0 in | Wt 158.0 lb

## 2014-12-29 DIAGNOSIS — K529 Noninfective gastroenteritis and colitis, unspecified: Secondary | ICD-10-CM

## 2014-12-29 DIAGNOSIS — R112 Nausea with vomiting, unspecified: Secondary | ICD-10-CM

## 2014-12-29 NOTE — Progress Notes (Signed)
Subjective:    Patient ID: Megan Combs, female    DOB: 1961-01-06, 54 y.o.   MRN: [id number]  HPI  12/23/2014 ER note Final diagnoses:  Gastroenteritis  Dehydration  Bilious vomiting with nausea    Pt comes in with cc of nausea and emesis. No abd pain, no diarrhea, passing flatus. Pt was dizzy and at arrival noted to have BP of 90/74. She was initiated on ivf. Pt is now feeling better. She has not had any emesis since ER arrival. PO challnge started, and patient did well. VSS and reassuring. Advised to f/u with pcp given hx of ibs.  Varney Biles, MD 12/23/14 [phone]       TODay  Megan Combs is here for ER follow up  See above  No further vomiting.  Normal Bm's    Tolerating bland diet.  No dizziness or pre-syncope  Allergies  Allergen Reactions  . Dulcolax [Bisacodyl]     Dehydration   . Effexor [Venlafaxine]     Intolerant SSRI/SNRI  . Erythromycin Nausea And Vomiting  . Penicillins Hives  . Prednisone     Paranoid , anxious, hyper emotionally, could not sleep, odd dreams  . Tape Dermatitis  . Zithromax [Azithromycin] Rash   Past Medical History  Diagnosis Date  . Depression   . Arthritis     right knee  . Fibromyalgia   . Degenerative disc disease   . Fibromyalgia   . Plantar fasciitis of left foot   . Salmonella poisoning   . IBS (irritable bowel syndrome)   . Hyperlipidemia   . PVC (premature ventricular contraction)   . Family history of early CAD   . Lumbar radiculitis   . Allergy   . Anemia   . Anxiety    Past Surgical History  Procedure Laterality Date  . Knee arthroscopy    . Tonsillectomy    . Laproscopy    . Knee arthroscopy    . Colonoscopy     History   Social History  . Marital Status: Married    Spouse Name: N/A  . Number of Children: 0  . Years of Education: N/A   Occupational History  . RN   .  [employer]   Social History Main Topics  . Smoking status: Never Smoker   . Smokeless tobacco: Never Used  .  Alcohol Use: No  . Drug Use: No  . Sexual Activity: Yes    Birth Control/ Protection: Post-menopausal   Other Topics Concern  . Not on file   Social History Narrative   Family History  Problem Relation Age of Onset  . Colon cancer Father   . Esophageal cancer Father   . Cancer Father   . Stroke Father   . Heart disease Father   . Hypertension Father   . Hyperlipidemia Father   . Diabetes Father   . Prostate cancer Father   . Stomach cancer Father   . Heart disease Mother   . Arthritis Mother   . Heart attack Mother   . Diabetes Maternal Grandfather   . Hypertension Brother   . Pancreatic cancer Neg Hx   . Rectal cancer Neg Hx    Patient Active Problem List   Diagnosis Date Noted  . Medication intolerance 07/04/2014  . Dyslipidemia 06/29/2014  . Family history of early CAD 06/29/2014  . Palpitations 06/29/2014  . Hyperlipidemia   . PVC (premature ventricular contraction)   . Low back pain 06/20/2014  . Right hip  pain 02/15/2014  . Right shoulder injury 02/01/2014  . Salmonella 10/27/2013  . Breast density 03/17/2013  . Renal cyst, left 01/06/2013  . Family history of colon cancer 01/03/2013  . Elevated alkaline phosphatase measurement 01/03/2013  . Articular cartilage disorder of knee 12/24/2012  . Plantar fasciitis 07/21/2012  . Anxiety 07/02/2012  . History of anemia 07/01/2012  . Depression 07/01/2012  . Menopause 07/01/2012  . Menopausal hot flushes 07/01/2012  . DJD (degenerative joint disease) 07/01/2012  . Other and unspecified hyperlipidemia 07/01/2012  . Vaginal dryness, menopausal 07/01/2012  . Urge incontinence of urine 07/01/2012  . Fibromyalgia   . Family history of malignant neoplasm 12/25/2011  . Personal history of colonic polyps 12/25/2011   Current Outpatient Prescriptions on File Prior to Visit  Medication Sig Dispense Refill  . acetic acid-hydrocortisone (VOSOL-HC) otic solution Place 4 drops into both ears daily. Use for one week and  then off 3 weeks    . b complex vitamins tablet Take 1 tablet by mouth daily.    Marland Kitchen buPROPion (WELLBUTRIN XL) 300 MG 24 hr tablet Take 1 tablet (300 mg total) by mouth daily. 30 tablet 1  . Calcium Carbonate-Vitamin D (CALCIUM 500 + D) 500-125 MG-UNIT TABS Take 1 tablet by mouth daily.    . diclofenac (VOLTAREN) 75 MG EC tablet Take 75 mg by mouth 2 (two) times daily.    Marland Kitchen estradiol (ESTRACE) 1 MG tablet Take 1 tablet (1 mg total) by mouth daily. 30 tablet 3  . ferrous sulfate 325 (65 FE) MG tablet Take 325 mg by mouth every Monday, Wednesday, and Friday.     . fish oil-omega-3 fatty acids 1000 MG capsule Take 1 g by mouth daily.    Marland Kitchen glucosamine-chondroitin 500-400 MG tablet Take 3 tablets by mouth daily.    . Multiple Vitamin (MULITIVITAMIN WITH MINERALS) TABS Take 1 tablet by mouth daily.    Marland Kitchen nystatin-triamcinolone ointment (MYCOLOG) Apply 1 application topically 2 (two) times daily. 30 g 0  . ondansetron (ZOFRAN ODT) 8 MG disintegrating tablet Take 1 tablet (8 mg total) by mouth every 8 (eight) hours as needed for nausea. 20 tablet 0  . progesterone (PROMETRIUM) 100 MG capsule Take 1 capsule (100 mg total) by mouth daily. 90 capsule 0  . promethazine (PHENERGAN) 25 MG suppository Place 1 suppository (25 mg total) rectally every 6 (six) hours as needed for nausea. 12 each 0  . STRATTERA 25 MG capsule   0  . sulfamethoxazole-trimethoprim (BACTRIM DS,SEPTRA DS) 800-160 MG per tablet Take 1 tablet by mouth 2 (two) times daily. 14 tablet 0   No current facility-administered medications on file prior to visit.      Review of Systems See HPI    Objective:   Physical Exam  Physical Exam  Nursing note and vitals reviewed.  Constitutional: She is oriented to person, place, and time. She appears well-developed and well-nourished.  HENT:  Head: Normocephalic and atraumatic.  Cardiovascular: Normal rate and regular rhythm. Exam reveals no gallop and no friction rub.  No murmur heard.    Pulmonary/Chest: Breath sounds normal. She has no wheezes. She has no rales.  Abd soft NT/ND  BS pos  No HSM Neurological: She is alert and oriented to person, place, and time.  Skin: Skin is warm and dry.  Psychiatric: She has a normal mood and affect. Her behavior is normal.        Assessment & Plan:  [place] to return to  work

## 2015-01-04 ENCOUNTER — Other Ambulatory Visit: Payer: Self-pay | Admitting: Dermatology

## 2015-01-05 ENCOUNTER — Other Ambulatory Visit: Payer: Self-pay | Admitting: *Deleted

## 2015-01-05 DIAGNOSIS — N951 Menopausal and female climacteric states: Secondary | ICD-10-CM

## 2015-01-05 NOTE — Telephone Encounter (Signed)
Refill request

## 2015-01-06 MED ORDER — ESTRADIOL 1 MG PO TABS: 1.0000 mg | ORAL_TABLET | Freq: Every day | ORAL | Status: DC

## 2015-03-07 ENCOUNTER — Other Ambulatory Visit: Payer: Self-pay | Admitting: Internal Medicine

## 2015-03-08 ENCOUNTER — Other Ambulatory Visit: Payer: Self-pay | Admitting: *Deleted

## 2015-03-08 NOTE — Telephone Encounter (Signed)
Refill request

## 2015-03-09 ENCOUNTER — Encounter: Payer: Self-pay | Admitting: Internal Medicine

## 2015-03-28 ENCOUNTER — Ambulatory Visit (INDEPENDENT_AMBULATORY_CARE_PROVIDER_SITE_OTHER): Payer: 59 | Admitting: Family Medicine

## 2015-03-28 ENCOUNTER — Encounter: Payer: Self-pay | Admitting: Family Medicine

## 2015-03-28 VITALS — BP 110/66 | HR 69 | Temp 98.4°F | Resp 16 | Ht 66.5 in | Wt 156.6 lb

## 2015-03-28 DIAGNOSIS — F329 Major depressive disorder, single episode, unspecified: Secondary | ICD-10-CM

## 2015-03-28 DIAGNOSIS — N951 Menopausal and female climacteric states: Secondary | ICD-10-CM | POA: Diagnosis not present

## 2015-03-28 DIAGNOSIS — F419 Anxiety disorder, unspecified: Secondary | ICD-10-CM | POA: Diagnosis not present

## 2015-03-28 DIAGNOSIS — M2390 Unspecified internal derangement of unspecified knee: Secondary | ICD-10-CM

## 2015-03-28 DIAGNOSIS — F32A Depression, unspecified: Secondary | ICD-10-CM

## 2015-03-28 MED ORDER — ESTRADIOL 1 MG PO TABS: 1.0000 mg | ORAL_TABLET | Freq: Every day | ORAL | Status: DC

## 2015-03-28 MED ORDER — DICLOFENAC SODIUM 75 MG PO TBEC: 75.0000 mg | DELAYED_RELEASE_TABLET | Freq: Two times a day (BID) | ORAL | Status: DC | PRN

## 2015-03-28 MED ORDER — PROGESTERONE MICRONIZED 100 MG PO CAPS: 100.0000 mg | ORAL_CAPSULE | Freq: Every day | ORAL | Status: DC

## 2015-03-28 MED ORDER — BUPROPION HCL ER (XL) 300 MG PO TB24: 300.0000 mg | ORAL_TABLET | Freq: Every day | ORAL | Status: DC

## 2015-03-28 NOTE — Progress Notes (Signed)
Subjective:    Patient ID: Megan Combs, female    DOB: 1961-03-27, 54 y.o.   MRN: [id number]  HPI Patient presents today to establish care. Saw Dr. Coralyn Mark, who has retired, for many years. She has a long standing history of anxiety and depression that is currently well controlled with Wellbutrin. In 2009, she lost both of her parents after brief illnesses and required the addition of Cymbalta. She has tried many SSRIs/SNRIs in the past which have intolerable sexual side effects. She feels like her symptoms are currently well controlled with the Wellbutrin and regular aerobic exercise.   She has been on combined hormonal therapy for about 1.5 years for symptoms of menopause. She has had great results with control of hot flashes, sleep disturbance. She was told her menopausal symptoms were severe.   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 She enjoys her current job and feels that she is able to have a good work-life balance and take care of herself.  She sees a cardiologist annually due to family history of early CAD and she participates in a DM prevention program that includes nutrition instruction and exercise.   Last PAP- 7/14- nml, never abnormal Mammo- 7/15- she makes her own appointment annually Tdap- 4/14  Past Medical History  Diagnosis Date  . Depression   . Arthritis     right knee  . Fibromyalgia   . Degenerative disc disease   . Fibromyalgia   . Plantar fasciitis of left foot   . Salmonella poisoning   . IBS (irritable bowel syndrome)   . Hyperlipidemia   . PVC (premature ventricular contraction)   . Family history of early CAD   . Lumbar radiculitis   . Allergy   . Anemia   . Anxiety    Past Surgical History  Procedure Laterality Date  . Knee arthroscopy    . Tonsillectomy    . Laproscopy    . Knee arthroscopy    .  Colonoscopy     Family History  Problem Relation Age of Onset  . Colon cancer Father   . Esophageal cancer Father   . Cancer Father   . Stroke Father   . Heart disease Father   . Hypertension Father   . Hyperlipidemia Father   . Diabetes Father   . Prostate cancer Father   . Stomach cancer Father   . Heart disease Mother   . Arthritis Mother   . Heart attack Mother   . Mental illness Mother   . Diabetes Maternal Grandfather   . Heart disease Maternal Grandfather   . Mental illness Maternal Grandfather   . Stroke Maternal Grandfather   . Hypertension Brother   . Hyperlipidemia Brother   . Pancreatic cancer Neg Hx   . Rectal cancer Neg Hx   . Cancer Maternal Grandmother     circulatory cancer  . Heart disease Paternal Grandfather   . Diabetes Paternal Grandfather    History  Substance Use Topics  . Smoking status: Never Smoker   . Smokeless tobacco: Never Used  . Alcohol Use: No   Review of Systems As per HPI    Objective:   Physical Exam Physical Exam  Constitutional: Oriented to person, place, and time. He appears well-developed and well-nourished.  HENT:  Head: Normocephalic and atraumatic.  Eyes: Conjunctivae are normal.  Neck: Normal range of  motion. Neck supple.  Cardiovascular: Normal rate, regular rhythm and normal heart sounds.   Pulmonary/Chest: Effort normal and breath sounds normal.  Musculoskeletal: Normal range of motion.  Neurological: Alert and oriented to person, place, and time.  Skin: Skin is warm and dry.  Psychiatric: Normal mood and affect. Behavior is normal. Judgment and thought content normal.  Vitals reviewed. BP 110/66 mmHg  Pulse 69  Temp(Src) 98.4 F (36.9 C) (Oral)  Resp 16  Ht 5' 6.5" (1.689 m)  Wt 156 lb 9.6 oz (71.033 kg)  BMI 24.90 kg/m2  SpO2 96%  LMP 10/28/2008     Assessment & Plan:  1. Depression - buPROPion (WELLBUTRIN XL) 300 MG 24 hr tablet; Take 1 tablet (300 mg total) by mouth daily.  Dispense: 90 tablet;  Refill: 1  2. Anxiety - buPROPion (WELLBUTRIN XL) 300 MG 24 hr tablet; Take 1 tablet (300 mg total) by mouth daily.  Dispense: 90 tablet; Refill: 1  3. Articular cartilage disorder of knee, unspecified laterality - diclofenac (VOLTAREN) 75 MG EC tablet; Take 1 tablet (75 mg total) by mouth 2 (two) times daily as needed.  Dispense: 60 tablet; Refill: 1  4. Menopausal symptoms - estradiol (ESTRACE) 1 MG tablet; Take 1 tablet (1 mg total) by mouth daily.  Dispense: 90 tablet; Refill: 1 - progesterone (PROMETRIUM) 100 MG capsule; Take 1 capsule (100 mg total) by mouth daily.  Dispense: 90 capsule; Refill: 1  - follow up in 6 months for CPE.  Clarene Reamer, FNP-BC  [facility]  03/28/2015 10:20 PM

## 2015-04-05 ENCOUNTER — Encounter: Payer: Self-pay | Admitting: Cardiology

## 2015-04-10 ENCOUNTER — Ambulatory Visit: Payer: BC Managed Care – PPO | Admitting: Family Medicine

## 2015-04-20 ENCOUNTER — Ambulatory Visit (INDEPENDENT_AMBULATORY_CARE_PROVIDER_SITE_OTHER): Payer: 59 | Admitting: Physician Assistant

## 2015-04-20 VITALS — BP 116/64 | HR 79 | Temp 98.9°F | Resp 18 | Ht 67.25 in | Wt 155.4 lb

## 2015-04-20 DIAGNOSIS — R300 Dysuria: Secondary | ICD-10-CM

## 2015-04-20 DIAGNOSIS — N3090 Cystitis, unspecified without hematuria: Secondary | ICD-10-CM | POA: Diagnosis not present

## 2015-04-20 LAB — POCT URINALYSIS DIPSTICK
Bilirubin, UA: NEGATIVE
Blood, UA: NEGATIVE
Glucose, UA: NEGATIVE
Ketones, UA: NEGATIVE
LEUKOCYTES UA: NEGATIVE
NITRITE UA: POSITIVE
PH UA: 6.5
Protein, UA: NEGATIVE
Spec Grav, UA: <=1.005
Urobilinogen, UA: 0.2

## 2015-04-20 LAB — POCT UA - MICROSCOPIC ONLY
BACTERIA, U MICROSCOPIC: NEGATIVE
CASTS, UR, LPF, POC: NEGATIVE
CRYSTALS, UR, HPF, POC: NEGATIVE
MUCUS UA: NEGATIVE
RBC, urine, microscopic: NEGATIVE
Yeast, UA: NEGATIVE

## 2015-04-20 MED ORDER — NITROFURANTOIN MONOHYD MACRO 100 MG PO CAPS: 100.0000 mg | ORAL_CAPSULE | Freq: Two times a day (BID) | ORAL | Status: AC

## 2015-04-20 NOTE — Patient Instructions (Addendum)
Drink plenty of water (at least 64 oz per day). Take antibiotic as prescribed until finished. Cranberry juice/pills can help. Continue AZO for pain I will call you with results from your urine culture. If symptoms are not improving in 7-10 days, return to clinic.

## 2015-04-20 NOTE — Progress Notes (Signed)
[facility] [address], [place] [address] [phone]  Date:  04/20/2015   Name:  Megan Combs   DOB:  [birth year]   MRN:  [id number]  PCP:  Kelton Pillar, MD    Chief Complaint: Urinary Tract Infection   History of Present Illness:  This is a 55 y.o. female with PMH HLD, who is presenting with dysuria, urinary frequency and strong urine odor x 4 days. Tried increased hydration and AZO and helped the dysuria but continue to noticed the strong urine odor. Having malaise over the past 1 day. Denies fever, chills, abdominal pain, back, n/v, hematuria. Last UTI 6 months ago. Was treated with macrobid at that time but no growth on urine culture. Sexually active with her husband. States prior to these symptoms beginning they were celebrating their anniversary and had more sex than usual. She states this is usually was triggers her UTIs. Pt is postmenopausals since age 69 and states she has had more UTIs since that time.  Review of Systems:  Review of Systems See HPI  Patient Active Problem List   Diagnosis Date Noted  . Medication intolerance 07/04/2014  . Family history of early CAD 06/29/2014  . Palpitations 06/29/2014  . Hyperlipidemia   . PVC (premature ventricular contraction)   . Low back pain 06/20/2014  . Right hip pain 02/15/2014  . Right shoulder injury 02/01/2014  . Salmonella 10/27/2013  . Breast density 03/17/2013  . Renal cyst, left 01/06/2013  . Family history of colon cancer 01/03/2013  . Elevated alkaline phosphatase measurement 01/03/2013  . Articular cartilage disorder of knee 12/24/2012  . Plantar fasciitis 07/21/2012  . Anxiety 07/02/2012  . History of anemia 07/01/2012  . Depression 07/01/2012  . Menopause 07/01/2012  . Menopausal hot flushes 07/01/2012  . DJD (degenerative joint disease) 07/01/2012  . Vaginal dryness, menopausal 07/01/2012  . Urge incontinence of urine 07/01/2012  . Fibromyalgia   . Family history of  malignant neoplasm 12/25/2011  . Personal history of colonic polyps 12/25/2011    Prior to Admission medications   Medication Sig Start Date End Date Taking? Authorizing Provider  acetic acid-hydrocortisone (VOSOL-HC) otic solution Place 4 drops into both ears daily. Use for one week and then off 3 weeks   Yes Historical Provider, MD  b complex vitamins tablet Take 1 tablet by mouth daily.   Yes Historical Provider, MD  buPROPion (WELLBUTRIN XL) 300 MG 24 hr tablet Take 1 tablet (300 mg total) by mouth daily. 03/28/15  Yes Elby Beck, FNP  Calcium Carbonate-Vitamin D (CALCIUM 500 + D) 500-125 MG-UNIT TABS Take 1 tablet by mouth daily.   Yes Historical Provider, MD  diclofenac (VOLTAREN) 75 MG EC tablet Take 1 tablet (75 mg total) by mouth 2 (two) times daily as needed. 03/28/15  Yes Elby Beck, FNP  estradiol (ESTRACE) 1 MG tablet Take 1 tablet (1 mg total) by mouth daily. 03/28/15  Yes Elby Beck, FNP  ferrous sulfate 325 (65 FE) MG tablet Take 325 mg by mouth every Monday, Wednesday, and Friday.    Yes Historical Provider, MD  fish oil-omega-3 fatty acids 1000 MG capsule Take 1 g by mouth daily.   Yes Historical Provider, MD  glucosamine-chondroitin 500-400 MG tablet Take 3 tablets by mouth daily.   Yes Historical Provider, MD  Multiple Vitamin (MULITIVITAMIN WITH MINERALS) TABS Take 1 tablet by mouth daily.   Yes Historical Provider, MD  progesterone (PROMETRIUM) 100 MG capsule Take 1 capsule (  100 mg total) by mouth daily. 03/28/15  Yes Elby Beck, FNP    Allergies  Allergen Reactions  . Dulcolax [Bisacodyl]     Dehydration   . Effexor [Venlafaxine]     Intolerant SSRI/SNRI  . Erythromycin Nausea And Vomiting  . Penicillins Hives  . Prednisone     Paranoid , anxious, hyper emotionally, could not sleep, odd dreams  . Tape Dermatitis  . Zithromax [Azithromycin] Rash    Past Surgical History  Procedure Laterality Date  . Knee arthroscopy    . Tonsillectomy     . Laproscopy    . Knee arthroscopy    . Colonoscopy      History  Substance Use Topics  . Smoking status: Never Smoker   . Smokeless tobacco: Never Used  . Alcohol Use: No    Family History  Problem Relation Age of Onset  . Colon cancer Father   . Esophageal cancer Father   . Cancer Father   . Stroke Father   . Heart disease Father   . Hypertension Father   . Hyperlipidemia Father   . Diabetes Father   . Prostate cancer Father   . Stomach cancer Father   . Heart disease Mother   . Arthritis Mother   . Heart attack Mother   . Mental illness Mother   . Diabetes Maternal Grandfather   . Heart disease Maternal Grandfather   . Mental illness Maternal Grandfather   . Stroke Maternal Grandfather   . Hypertension Brother   . Hyperlipidemia Brother   . Pancreatic cancer Neg Hx   . Rectal cancer Neg Hx   . Cancer Maternal Grandmother     circulatory cancer  . Heart disease Paternal Grandfather   . Diabetes Paternal Grandfather     Medication list has been reviewed and updated.  Physical Examination:  Physical Exam  Constitutional: She is oriented to person, place, and time. She appears well-developed and well-nourished. No distress.  HENT:  Head: Normocephalic and atraumatic.  Right Ear: Hearing normal.  Left Ear: Hearing normal.  Nose: Nose normal.  Eyes: Conjunctivae and lids are normal. Right eye exhibits no discharge. Left eye exhibits no discharge. No scleral icterus.  Cardiovascular: Normal rate, regular rhythm, normal heart sounds and normal pulses.   No murmur heard. Pulmonary/Chest: Effort normal and breath sounds normal. No respiratory distress. She has no wheezes. She has no rhonchi. She has no rales.  Abdominal: Soft. Normal appearance. There is tenderness in the suprapubic area. There is no CVA tenderness.  Musculoskeletal: Normal range of motion.  Neurological: She is alert and oriented to person, place, and time.  Skin: Skin is warm, dry and intact.  No lesion and no rash noted.  Psychiatric: She has a normal mood and affect. Her speech is normal and behavior is normal. Thought content normal.   BP 116/64 mmHg  Pulse 79  Temp(Src) 98.9 F (37.2 C) (Oral)  Resp 18  Ht 5' 7.25" (1.708 m)  Wt 155 lb 6.4 oz (70.489 kg)  BMI 24.16 kg/m2  SpO2 98%  LMP 10/28/2008  Results for orders placed or performed in visit on 04/20/15  POCT UA - Microscopic Only  Result Value Ref Range   WBC, Ur, HPF, POC 0-1    RBC, urine, microscopic neg    Bacteria, U Microscopic neg    Mucus, UA neg    Epithelial cells, urine per micros 0-1    Crystals, Ur, HPF, POC neg    Casts, Ur, LPF,  POC neg    Yeast, UA neg   POCT urinalysis dipstick  Result Value Ref Range   Color, UA orange    Clarity, UA clear    Glucose, UA neg    Bilirubin, UA neg    Ketones, UA neg    Spec Grav, UA <=1.005    Blood, UA neg    pH, UA 6.5    Protein, UA neg    Urobilinogen, UA 0.2    Nitrite, UA positive    Leukocytes, UA Negative Negative   Assessment and Plan:  1. Dysuria 2. cystitis UA suggestive of UTI. Urine culture pending. macrobid BID x 7 days. Counseled on AZO, increased hydration and starting to take cranberry pills daily for prevention. Return if not getting better in 7-10 days. - POCT UA - Microscopic Only - POCT urinalysis dipstick - nitrofurantoin, macrocrystal-monohydrate, (MACROBID) 100 MG capsule; Take 1 capsule (100 mg total) by mouth 2 (two) times daily.  Dispense: 14 capsule; Refill: 0 - Urine culture   Benjaman Pott. Drenda Freeze, MHS [facility]  04/20/2015

## 2015-04-21 LAB — URINE CULTURE

## 2015-04-25 ENCOUNTER — Other Ambulatory Visit: Payer: Self-pay

## 2015-04-25 DIAGNOSIS — Z1231 Encounter for screening mammogram for malignant neoplasm of breast: Secondary | ICD-10-CM

## 2015-05-06 ENCOUNTER — Encounter: Payer: Self-pay | Admitting: Family Medicine

## 2015-05-29 ENCOUNTER — Ambulatory Visit
Admission: RE | Admit: 2015-05-29 | Discharge: 2015-05-29 | Disposition: A | Discharge status: Home / Self Care | Payer: 59 | Source: Ambulatory Visit

## 2015-05-29 DIAGNOSIS — Z1231 Encounter for screening mammogram for malignant neoplasm of breast: Secondary | ICD-10-CM

## 2015-06-01 ENCOUNTER — Other Ambulatory Visit: Payer: Self-pay | Admitting: Family Medicine

## 2015-06-01 DIAGNOSIS — R928 Other abnormal and inconclusive findings on diagnostic imaging of breast: Secondary | ICD-10-CM

## 2015-06-05 ENCOUNTER — Ambulatory Visit
Admission: RE | Admit: 2015-06-05 | Discharge: 2015-06-05 | Disposition: A | Discharge status: Home / Self Care | Payer: 59 | Source: Ambulatory Visit | Attending: Family Medicine | Admitting: Family Medicine

## 2015-06-05 DIAGNOSIS — R928 Other abnormal and inconclusive findings on diagnostic imaging of breast: Secondary | ICD-10-CM

## 2015-07-04 ENCOUNTER — Telehealth: Payer: Self-pay

## 2015-07-04 MED ORDER — HYDROCORTISONE-ACETIC ACID 1-2 % OT SOLN: 4.0000 [drp] | Freq: Every day | OTIC | Status: DC

## 2015-07-04 NOTE — Telephone Encounter (Signed)
Can we refill? 

## 2015-07-04 NOTE — Telephone Encounter (Signed)
Okay to refill for 6 months.  I will cosign.  Philis Fendt, MS, PA-C   12:28 PM, 07/04/2015

## 2015-07-04 NOTE — Telephone Encounter (Signed)
Pt requesting refills for her Astpro,Hydrocortisone acitic acid called to pharmacy   Best phone for pt is [phone]   Pharmacy [facility]

## 2015-07-04 NOTE — Telephone Encounter (Signed)
Rx sent in, pt notified on voicemail.

## 2015-07-05 ENCOUNTER — Ambulatory Visit (INDEPENDENT_AMBULATORY_CARE_PROVIDER_SITE_OTHER): Payer: 59

## 2015-07-05 ENCOUNTER — Ambulatory Visit (INDEPENDENT_AMBULATORY_CARE_PROVIDER_SITE_OTHER): Payer: 59 | Admitting: Family Medicine

## 2015-07-05 VITALS — BP 122/76 | HR 73 | Temp 98.6°F | Resp 18 | Ht 65.5 in | Wt 153.0 lb

## 2015-07-05 DIAGNOSIS — Z111 Encounter for screening for respiratory tuberculosis: Secondary | ICD-10-CM

## 2015-07-05 DIAGNOSIS — R059 Cough, unspecified: Secondary | ICD-10-CM

## 2015-07-05 DIAGNOSIS — J069 Acute upper respiratory infection, unspecified: Secondary | ICD-10-CM | POA: Diagnosis not present

## 2015-07-05 DIAGNOSIS — R05 Cough: Secondary | ICD-10-CM

## 2015-07-05 LAB — POCT CBC
Granulocyte percent: 56.4 % (ref 37–80)
HCT, POC: 41.5 % (ref 37.7–47.9)
Hemoglobin: 13.3 g/dL (ref 12.2–16.2)
Lymph, poc: 2.9 (ref 0.6–3.4)
MCH, POC: 28.9 pg (ref 27–31.2)
MCHC: 32.1 g/dL (ref 31.8–35.4)
MCV: 90.1 fL (ref 80–97)
MID (cbc): 0.7 (ref 0–0.9)
MPV: 8.5 fL (ref 0–99.8)
POC Granulocyte: 4.7 (ref 2–6.9)
POC LYMPH PERCENT: 34.8 %L (ref 10–50)
POC MID %: 8.8 % (ref 0–12)
Platelet Count, POC: 201 10*3/uL (ref 142–424)
RBC: 4.61 M/uL (ref 4.04–5.48)
RDW, POC: 12.8 %
WBC: 8.3 10*3/uL (ref 4.6–10.2)

## 2015-07-05 NOTE — Progress Notes (Deleted)
..  We recommend that you schedule a mammogram for breast cancer screening. Typically, you do not need a referral to do this. Please contact a local imaging center to schedule your mammogram.  [facility] - [phone]  *ask for the Radiology Department [facility] - [phone] or [phone]  [facility] - [phone] [facility] - [phone] [facility] - [phone]  *ask for the Radiology Department [facility] - [phone]  *ask for the Radiology Department [facility] - [phone]  *ask for the Mammography Department [facility] - [phone] 

## 2015-07-05 NOTE — Progress Notes (Signed)
Chief Complaint:  Chief Complaint  Patient presents with  . Chills    at night   . possible tb exposure    HPI: Megan Combs is a 54 y.o. 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 There is a medical unti there. She is there twice a week.  She has been in cotnact with a person from the [facility], he has been here June 01 2015. He states that he has been coughing up blood 1 month or several weeks before coming into [place], since he ahd a chest iunjury. All this trhough interpretationline. Last time he coughed up blood was August 4. He had no other sxs. He He was transferred to the medical nurse and went to the HD.   Thsi was 2 weeks ago. She had a sore throat, chills, fatigue, night sweats. She has never had a positive PPD in the past.  She has had qauntiferon gold.  No fevers, rashes, n/v/abd pain, diarrhea. Some LAD but not sure. She has tried rest and fluids and ibuprofen.      Past Medical History  Diagnosis Date  . Depression   . Arthritis     right knee  . Fibromyalgia   . Degenerative disc disease   . Fibromyalgia   . Plantar fasciitis of left foot   . Salmonella poisoning   . IBS (irritable bowel syndrome)   . Hyperlipidemia   . PVC (premature ventricular contraction)   . Family history of early CAD   . Lumbar radiculitis   . Allergy   . Anemia   . Anxiety    Past Surgical History  Procedure Laterality Date  . Knee arthroscopy    . Tonsillectomy    . Laproscopy    . Knee arthroscopy    . Colonoscopy     Social History   Social History  . Marital Status: Married    Spouse Name: N/A  . Number of Children: 0  . Years of Education: N/A   Occupational History  . RN   .  [employer]   Social History Main Topics  . Smoking status: Never Smoker   . Smokeless tobacco: Never Used  . Alcohol Use: No  . Drug Use: No  .  Sexual Activity: Yes    Birth Control/ Protection: Post-menopausal   Other Topics Concern  . None   Social History Narrative   Family History  Problem Relation Age of Onset  . Colon cancer Father   . Esophageal cancer Father   . Cancer Father   . Stroke Father   . Heart disease Father   . Hypertension Father   . Hyperlipidemia Father   . Diabetes Father   . Prostate cancer Father   . Stomach cancer Father   . Heart disease Mother   . Arthritis Mother   . Heart attack Mother   . Mental illness Mother   . Diabetes Maternal Grandfather   . Heart disease Maternal Grandfather   . Mental illness Maternal Grandfather   . Stroke Maternal Grandfather   . Hypertension Brother   . Hyperlipidemia Brother   . Pancreatic cancer Neg Hx   . Rectal cancer Neg Hx   . Cancer Maternal Grandmother     circulatory cancer  . Heart disease Paternal Grandfather   . Diabetes Paternal Grandfather    Allergies  Allergen  Reactions  . Dulcolax [Bisacodyl]     Dehydration   . Effexor [Venlafaxine]     Intolerant SSRI/SNRI  . Erythromycin Nausea And Vomiting  . Penicillins Hives  . Prednisone     Paranoid , anxious, hyper emotionally, could not sleep, odd dreams  . Tape Dermatitis  . Zithromax [Azithromycin] Rash   Prior to Admission medications   Medication Sig Start Date End Date Taking? Authorizing Provider  acetic acid-hydrocortisone (VOSOL-HC) otic solution Place 4 drops into both ears daily. Use for one week and then off 3 weeks 07/04/15  Yes Tereasa Coop, PA-C  b complex vitamins tablet Take 1 tablet by mouth daily.   Yes Historical Provider, MD  buPROPion (WELLBUTRIN XL) 300 MG 24 hr tablet Take 1 tablet (300 mg total) by mouth daily. 03/28/15  Yes Elby Beck, FNP  Calcium Carbonate-Vitamin D (CALCIUM 500 + D) 500-125 MG-UNIT TABS Take 1 tablet by mouth daily.   Yes Historical Provider, MD  diclofenac (VOLTAREN) 75 MG EC tablet Take 1 tablet (75 mg total) by mouth 2 (two) times  daily as needed. 03/28/15  Yes Elby Beck, FNP  estradiol (ESTRACE) 1 MG tablet Take 1 tablet (1 mg total) by mouth daily. 03/28/15  Yes Elby Beck, FNP  ferrous sulfate 325 (65 FE) MG tablet Take 325 mg by mouth every Monday, Wednesday, and Friday.    Yes Historical Provider, MD  fish oil-omega-3 fatty acids 1000 MG capsule Take 1 g by mouth daily.   Yes Historical Provider, MD  glucosamine-chondroitin 500-400 MG tablet Take 3 tablets by mouth daily.   Yes Historical Provider, MD  Lidocaine HCl (ASTERO EX) Place into the nose.   Yes Historical Provider, MD  Multiple Vitamin (MULITIVITAMIN WITH MINERALS) TABS Take 1 tablet by mouth daily.   Yes Historical Provider, MD  progesterone (PROMETRIUM) 100 MG capsule Take 1 capsule (100 mg total) by mouth daily. 03/28/15  Yes Elby Beck, FNP     ROS: The patient denies  unintentional weight loss, chest pain, palpitations, wheezing, dyspnea on exertion, nausea, vomiting, abdominal pain, dysuria, hematuria, melena, numbness, weakness, or tingling.  All other systems have been reviewed and were otherwise negative with the exception of those mentioned in the HPI and as above.    PHYSICAL EXAM: Filed Vitals:   07/05/15 1109  BP: 122/76  Pulse: 73  Temp: 98.6 F (37 C)  Resp: 18   Body mass index is 25.06 kg/(m^2).   General: Alert, no acute distress HEENT:  Normocephalic, atraumatic, oropharynx patent. EOMI, PERRLA Erythematous throat, no exudates, TM normal, + min maxiallry sinus tenderness, + erythematous/boggy nasal mucosa Cardiovascular:  Regular rate and rhythm, no rubs murmurs or gallops.  No Carotid bruits, radial pulse intact. No pedal edema.  Respiratory: Clear to auscultation bilaterally.  No wheezes, rales, or rhonchi.  No cyanosis, no use of accessory musculature Abdominal: No organomegaly, abdomen is soft and non-tender, positive bowel sounds. No masses. Skin: No rashes. Neurologic: Facial musculature  symmetric. Psychiatric: Patient acts appropriately throughout our interaction. Lymphatic: No cervical or submandibular lymphadenopathy Musculoskeletal: Gait intact. No edema, tenderness   LABS: Results for orders placed or performed in visit on 07/05/15  POCT CBC  Result Value Ref Range   WBC 8.3 4.6 - 10.2 K/uL   Lymph, poc 2.9 0.6 - 3.4   POC LYMPH PERCENT 34.8 10 - 50 %L   MID (cbc) 0.7 0 - 0.9   POC MID % 8.8 0 - 12 %M  POC Granulocyte 4.7 2 - 6.9   Granulocyte percent 56.4 37 - 80 %G   RBC 4.61 4.04 - 5.48 M/uL   Hemoglobin 13.3 12.2 - 16.2 g/dL   HCT, POC 41.5 37.7 - 47.9 %   MCV 90.1 80 - 97 fL   MCH, POC 28.9 27 - 31.2 pg   MCHC 32.1 31.8 - 35.4 g/dL   RDW, POC 12.8 %   Platelet Count, POC 201 142 - 424 K/uL   MPV 8.5 0 - 99.8 fL     EKG/XRAY:   Primary read interpreted by Dr. Marin Comment at [facility]. Neg for any acute cardiopulmonary process   ASSESSMENT/PLAN: Encounter Diagnoses  Name Primary?  . Cough   . Screening for tuberculosis   . Acute URI Yes   Most likely viral illness Chest xray normal Will cont with sxs care with otc meds prn, will be otu of work until improved next week PPD results pending  Gross sideeffects, risk and benefits, and alternatives of medications d/w patient. Patient is aware that all medications have potential sideeffects and we are unable to predict every sideeffect or drug-drug interaction that may occur.  Javell Blackburn DO  07/07/2015 10:22 AM

## 2015-07-05 NOTE — Progress Notes (Signed)
   Subjective:    Patient ID: Megan Combs, female    DOB: [birth year], 54 y.o.   MRN: [id number]  HPI    Review of Systems     Objective:   Physical Exam        Assessment & Plan:

## 2015-07-05 NOTE — Progress Notes (Deleted)
  Tuberculosis Risk Questionnaire  1.   {EXAM; YES/NO:19492::"No"} Were you born outside the [place] in one of the following parts of the world: [place], [place], [place], [place] or [place]?    2. {EXAM; YES/NO:19492::"No"} Have you traveled outside the [place] and lived for more than one month in one of the following parts of the world: [place], [place], [place], [place] or [place]?     3. {EXAM; YES/NO:19492::"No"} Do you have a compromised immune system such as from any of the following conditions:HIV/AIDS, organ or bone marrow transplantation, diabetes, immunosuppressive medicines (e.g. Prednisone, Remicaide), leukemia, lymphoma, cancer of the head or neck, gastrectomy or jejunal bypass, end-stage renal disease (on dialysis), or silicosis?     4. Yes *** Have you ever or do you plan on working in: a residential care center, a health care facility, a jail or prison or homeless shelter?    5. {EXAM; YES/NO:19492::"No"} Have you ever: injected illegal drugs, used crack cocaine, lived in a homeless shelter  or been in jail or prison?     6. {EXAM; YES/NO:19492::"No"} Have you ever been exposed to anyone with infectious tuberculosis?    Tuberculosis Symptom Questionnaire  Do you currently have any of the following symptoms?  1. {EXAM; YES/NO:19492::"No"} Unexplained cough lasting more than 3 weeks?   2. {EXAM; YES/NO:19492::"No"} Unexplained fever lasting more than 3 weeks.   3. {EXAM; YES/NO:19492::"No"} Night Sweats (sweating that leaves the bedclothes and sheets wet)     4. {EXAM; YES/NO:19492::"No"} Shortness of Breath   5. {EXAM; YES/NO:19492::"No"} Chest Pain   6. {EXAM; YES/NO:19492::"No"} Unintentional weight loss    7. {EXAM; YES/NO:19492::"No"} Unexplained fatigue (very tired for no reason)

## 2015-07-06 ENCOUNTER — Ambulatory Visit: Payer: 59 | Admitting: Cardiology

## 2015-07-07 ENCOUNTER — Ambulatory Visit (INDEPENDENT_AMBULATORY_CARE_PROVIDER_SITE_OTHER): Payer: 59

## 2015-07-07 DIAGNOSIS — Z111 Encounter for screening for respiratory tuberculosis: Secondary | ICD-10-CM

## 2015-07-07 LAB — TB SKIN TEST
Induration: 0 mm
TB Skin Test: NEGATIVE

## 2015-07-07 NOTE — Progress Notes (Signed)
   Subjective:    Patient ID: Megan Combs, female    DOB: 04/09/1961, 54 y.o.   MRN: [id number]  HPI Patient was here for a PPD reading. Results negative. Induration 0.62mm. Patient was given a copy of her results.     Review of Systems     Objective:   Physical Exam        Assessment & Plan:

## 2015-07-10 ENCOUNTER — Ambulatory Visit (INDEPENDENT_AMBULATORY_CARE_PROVIDER_SITE_OTHER): Payer: 59 | Admitting: Physician Assistant

## 2015-07-10 VITALS — BP 106/64 | HR 74 | Temp 98.3°F | Resp 18 | Ht 66.5 in | Wt 155.0 lb

## 2015-07-10 DIAGNOSIS — R61 Generalized hyperhidrosis: Secondary | ICD-10-CM | POA: Diagnosis not present

## 2015-07-10 DIAGNOSIS — Z201 Contact with and (suspected) exposure to tuberculosis: Secondary | ICD-10-CM | POA: Diagnosis not present

## 2015-07-10 DIAGNOSIS — J309 Allergic rhinitis, unspecified: Secondary | ICD-10-CM | POA: Diagnosis not present

## 2015-07-10 DIAGNOSIS — J019 Acute sinusitis, unspecified: Secondary | ICD-10-CM

## 2015-07-10 MED ORDER — AZELASTINE HCL 0.15 % NA SOLN
1.0000 | Freq: Two times a day (BID) | NASAL | Status: DC
Start: 2015-07-10 — End: 2015-11-01

## 2015-07-10 MED ORDER — DOXYCYCLINE HYCLATE 100 MG PO CAPS: 100.0000 mg | ORAL_CAPSULE | Freq: Two times a day (BID) | ORAL | Status: AC

## 2015-07-10 NOTE — Progress Notes (Signed)
[facility] [address], [place] [address] [phone]  Date:  07/10/2015   Name:  Megan Combs   DOB:  January 15, 1961   MRN:  [id number]  PCP:  Kelton Pillar, MD    Chief Complaint: Sore Throat; Nasal Congestion; Night Sweats; Sinusitis; and Medication Refill   History of Present Illness:  This is a 54 y.o. female with PMH allergic rhinitis, menopausal hot flashes who is presenting with URI sx x 8 days. She is having sore throat, nasal congestion, fatigue and sinus pressure. She is feeling somewhat better but not resolved and she is worried. Cough is dry, feels more like coughing up drainage from throat. Sore throat and cough are improving. Sinus pressure about the same and worse with bending forward. She has felt feverish but no documented fevers. She denies wheezing, SOB, otalgia. Taking ibuprofen 400 mg as needed. Started afrin 36 hours ago. Usually uses astepro prn allergic/uri sx but out of that.   Pt was seen here 5 days ago with these same symptoms. She was very worried because 2.5-3 weeks ago she was exposed to someone with possible TB. 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 She spent time with a pt who recently came here from the [facility] who had hemoptysis. She is not sure if he had a positive TB test result. When she was seen here 5 days ago CXR, ppd and CBC negative for work up. She continues to have night sweats that are about the same since onset of above uri sx. She is on estrace for menopausal night sweats but has not had problems with night sweats since getting on right dose of HRT months ago.  Review of Systems:  Review of Systems See HPI  Patient Active Problem List   Diagnosis Date Noted  . Medication intolerance 07/04/2014  . Family history of early CAD 06/29/2014  . Palpitations 06/29/2014  . Hyperlipidemia   . PVC (premature ventricular contraction)   . Low back pain 06/20/2014  . Right hip pain  02/15/2014  . Right shoulder injury 02/01/2014  . Salmonella 10/27/2013  . Breast density 03/17/2013  . Renal cyst, left 01/06/2013  . Family history of colon cancer 01/03/2013  . Elevated alkaline phosphatase measurement 01/03/2013  . Articular cartilage disorder of knee 12/24/2012  . Plantar fasciitis 07/21/2012  . Anxiety 07/02/2012  . History of anemia 07/01/2012  . Depression 07/01/2012  . Menopausal hot flushes 07/01/2012  . DJD (degenerative joint disease) 07/01/2012  . Vaginal dryness, menopausal 07/01/2012  . Urge incontinence of urine 07/01/2012  . Fibromyalgia   . Family history of malignant neoplasm 12/25/2011  . Personal history of colonic polyps 12/25/2011    Prior to Admission medications   Medication Sig Start Date End Date Taking? Authorizing Provider  acetic acid-hydrocortisone (VOSOL-HC) otic solution Place 4 drops into both ears daily. Use for one week and then off 3 weeks 07/04/15  Yes Tereasa Coop, PA-C  Azelastine HCl (ASTEPRO) 0.15 % SOLN Place into the nose.   Yes Historical Provider, MD  b complex vitamins tablet Take 1 tablet by mouth daily.   Yes Historical Provider, MD  buPROPion (WELLBUTRIN XL) 300 MG 24 hr tablet Take 1 tablet (300 mg total) by mouth daily. 03/28/15  Yes Elby Beck, FNP  Calcium Carbonate-Vitamin D (CALCIUM 500 + D) 500-125 MG-UNIT TABS Take 1 tablet by mouth daily.   Yes Historical Provider, MD  diclofenac (VOLTAREN) 75 MG EC  tablet Take 1 tablet (75 mg total) by mouth 2 (two) times daily as needed. 03/28/15  Yes Elby Beck, FNP  estradiol (ESTRACE) 1 MG tablet Take 1 tablet (1 mg total) by mouth daily. 03/28/15  Yes Elby Beck, FNP  ferrous sulfate 325 (65 FE) MG tablet Take 325 mg by mouth every Monday, Wednesday, and Friday.    Yes Historical Provider, MD  fish oil-omega-3 fatty acids 1000 MG capsule Take 1 g by mouth daily.   Yes Historical Provider, MD  glucosamine-chondroitin 500-400 MG tablet Take 3 tablets by  mouth daily.   Yes Historical Provider, MD  Multiple Vitamin (MULITIVITAMIN WITH MINERALS) TABS Take 1 tablet by mouth daily.   Yes Historical Provider, MD  progesterone (PROMETRIUM) 100 MG capsule Take 1 capsule (100 mg total) by mouth daily. 03/28/15  Yes Elby Beck, FNP  Lidocaine HCl (ASTERO EX) Place into the nose.    Historical Provider, MD    Allergies  Allergen Reactions  . Dulcolax [Bisacodyl]     Dehydration   . Effexor [Venlafaxine]     Intolerant SSRI/SNRI  . Erythromycin Nausea And Vomiting  . Penicillins Hives  . Prednisone     Paranoid , anxious, hyper emotionally, could not sleep, odd dreams  . Tape Dermatitis  . Zithromax [Azithromycin] Rash    Past Surgical History  Procedure Laterality Date  . Knee arthroscopy    . Tonsillectomy    . Laproscopy    . Knee arthroscopy    . Colonoscopy      Social History  Substance Use Topics  . Smoking status: Never Smoker   . Smokeless tobacco: Never Used  . Alcohol Use: No    Family History  Problem Relation Age of Onset  . Colon cancer Father   . Esophageal cancer Father   . Cancer Father   . Stroke Father   . Heart disease Father   . Hypertension Father   . Hyperlipidemia Father   . Diabetes Father   . Prostate cancer Father   . Stomach cancer Father   . Heart disease Mother   . Arthritis Mother   . Heart attack Mother   . Mental illness Mother   . Diabetes Maternal Grandfather   . Heart disease Maternal Grandfather   . Mental illness Maternal Grandfather   . Stroke Maternal Grandfather   . Hypertension Brother   . Hyperlipidemia Brother   . Pancreatic cancer Neg Hx   . Rectal cancer Neg Hx   . Cancer Maternal Grandmother     circulatory cancer  . Heart disease Paternal Grandfather   . Diabetes Paternal Grandfather     Medication list has been reviewed and updated.  Physical Examination:  Physical Exam  Constitutional: She is oriented to person, place, and time. She appears  well-developed and well-nourished. No distress.  HENT:  Head: Normocephalic and atraumatic.  Right Ear: Hearing, tympanic membrane, external ear and ear canal normal.  Left Ear: Hearing, tympanic membrane, external ear and ear canal normal.  Nose: Mucosal edema (bilateral) and rhinorrhea present. Right sinus exhibits maxillary sinus tenderness. Right sinus exhibits no frontal sinus tenderness. Left sinus exhibits maxillary sinus tenderness. Left sinus exhibits no frontal sinus tenderness.  Mouth/Throat: Uvula is midline and mucous membranes are normal. Posterior oropharyngeal erythema present. No oropharyngeal exudate or posterior oropharyngeal edema.  Tonsils absent  Eyes: Conjunctivae and lids are normal. Right eye exhibits no discharge. Left eye exhibits no discharge. No scleral icterus.  Cardiovascular: Normal rate,  regular rhythm, normal heart sounds and normal pulses.   No murmur heard. Pulmonary/Chest: Effort normal and breath sounds normal. No respiratory distress. She has no wheezes. She has no rhonchi. She has no rales.  Musculoskeletal: Normal range of motion.  Lymphadenopathy:       Head (right side): No submental, no submandibular and no tonsillar adenopathy present.       Head (left side): No submental, no submandibular and no tonsillar adenopathy present.    She has no cervical adenopathy.  Neurological: She is alert and oriented to person, place, and time.  Skin: Skin is warm, dry and intact. No lesion and no rash noted.  Psychiatric: She has a normal mood and affect. Her speech is normal and behavior is normal. Thought content normal.   BP 106/64 mmHg  Pulse 74  Temp(Src) 98.3 F (36.8 C) (Oral)  Resp 18  Ht 5' 6.5" (1.689 m)  Wt 155 lb (70.308 kg)  BMI 24.65 kg/m2  SpO2 98%  LMP 10/28/2008  Assessment and Plan:  1. Acute sinusitis, recurrence not specified, unspecified location 2. Allergic rhinitis Sx likely viral illness with secondary sinusitis. Will treat with  doxy and refilled astepro. Return if sx not improved in 7 days. - Azelastine HCl (ASTEPRO) 0.15 % SOLN; Place 1-2 sprays into the nose 2 (two) times daily.  Dispense: 30 mL; Refill: 2 - doxycycline (VIBRAMYCIN) 100 MG capsule; Take 1 capsule (100 mg total) by mouth 2 (two) times daily. AVOID EXCESS SUN EXPOSURE WHILE ON THIS MEDICATION  Dispense: 14 capsule; Refill: 0  3. Exposure to TB 4. Night sweats Night sweats likely d/t above illness however she was possibly exposed to TB 3 weeks ago and cannot rule out TB infection. 5 days ago CXR, ppd and CBC negative which is reassuring however it takes 2-8 weeks for body to amount immune response to TB that would be picked up on CXR or ppd. She will return in 8 weeks for repeat ppd. Return sooner if sx worsen/night sweats do not resolve with abx treatment for sinus infection. - TB Skin Test; Future   Benjaman Pott. Drenda Freeze, MHS [facility]  07/10/2015

## 2015-07-10 NOTE — Patient Instructions (Signed)
Take doxy twice a day for 7 days for sinus infection. Avoid excess sun exposure during this time. Stop afrin and use astepro instead. Hot showers, breathing in steam from shower or pot of boiling onions, eating spicy food and neti pot with sterile water can all help your sinuses drain. If you are not getting better in 7 days, return to clinic. If your night sweats persist after finishing treatment, return to clinic. Return in 8 weeks for repeat ppd.

## 2015-08-14 IMAGING — CT CT ABD-PELV W/ CM
1 of 3 series · 14 of 32 positions shown, 19 images · IV contrast (OMNIPAQUE 300)
Comparison: Ultrasound abdomen of 01/01/2013

CLINICAL DATA: Abdominal pain, vomiting, recent history of lower GI
bleed

EXAM:
CT ABDOMEN AND PELVIS WITH CONTRAST
TECHNIQUE: Multidetector CT imaging of the abdomen and pelvis was performed
using the standard protocol following bolus administration of
intravenous contrast.
CONTRAST:  50mL OMNIPAQUE IOHEXOL 300 MG/ML SOLN, 100mL OMNIPAQUE
IOHEXOL 300 MG/ML SOLN

[Series 2: abd/pel with · axial · 0.69mm/px · z∈[+908,+1314]mm · 14 of 93 slices shown, 19 images]
[im 6/93  soft-tissue]
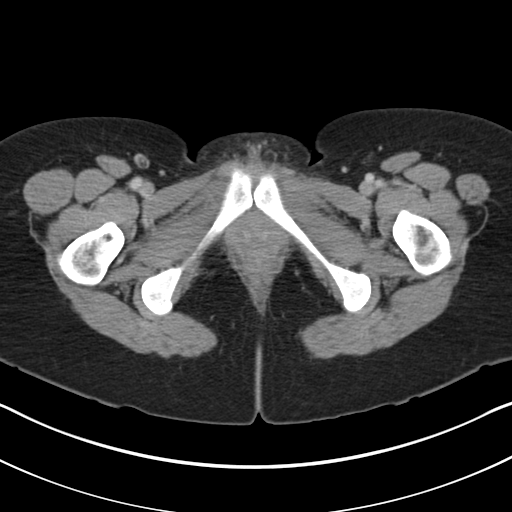
[im 6/93  bone]
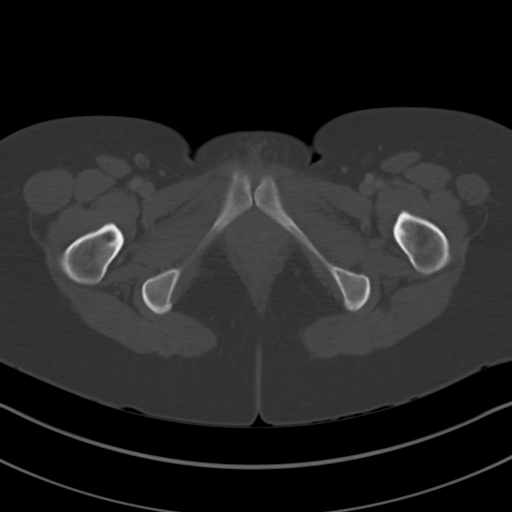
[im 11/93  soft-tissue]
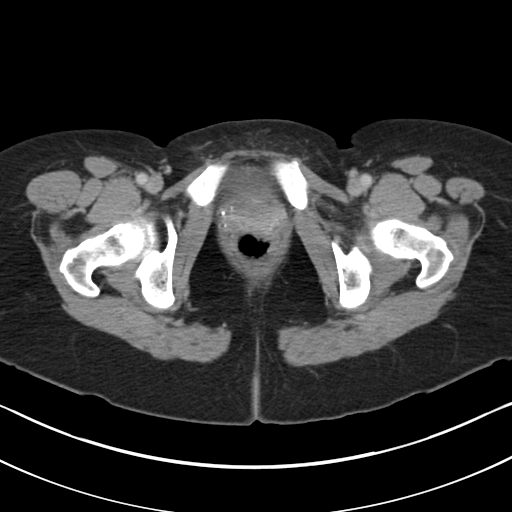
[im 22/93  soft-tissue]
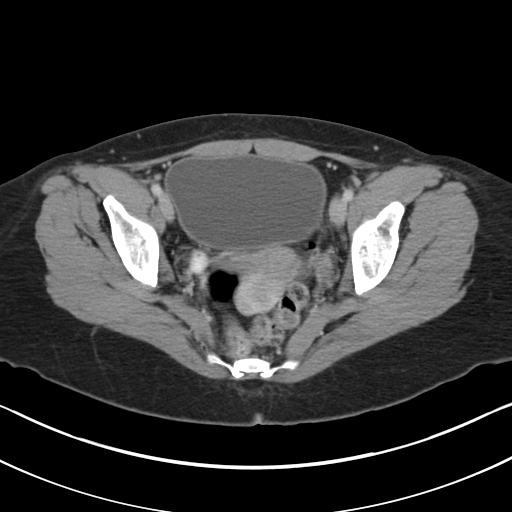
[im 28/93  soft-tissue]
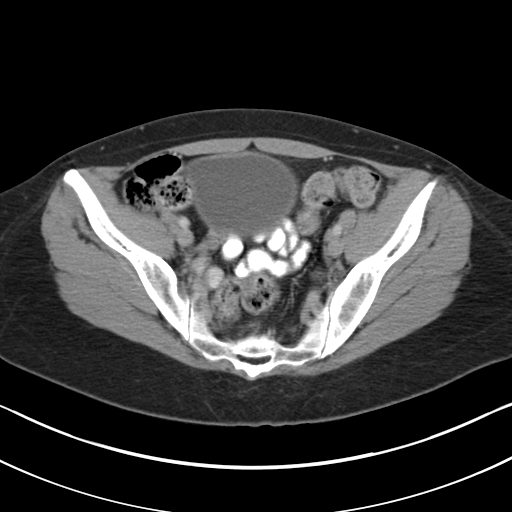
[im 33/93  soft-tissue]
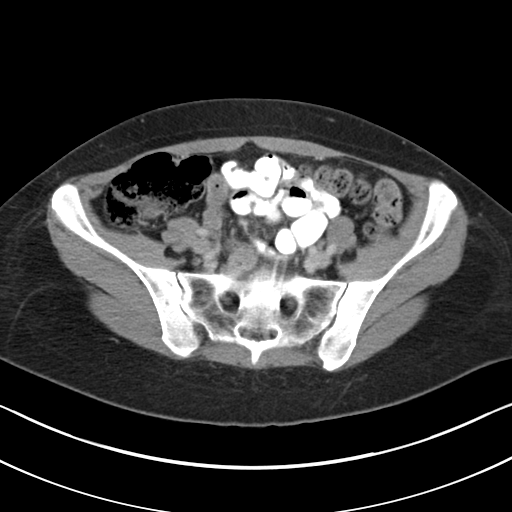
[im 38/93  soft-tissue]
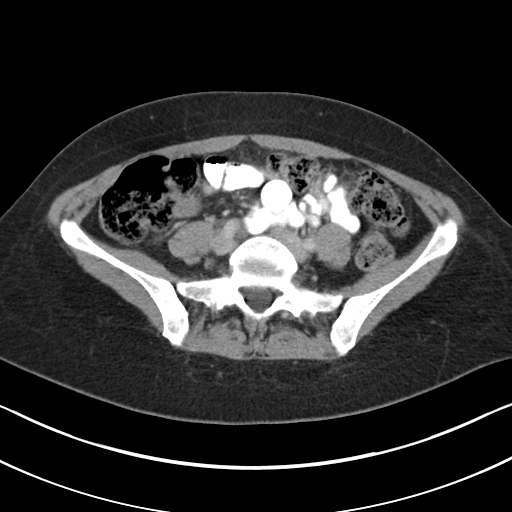
[im 49/93  soft-tissue]
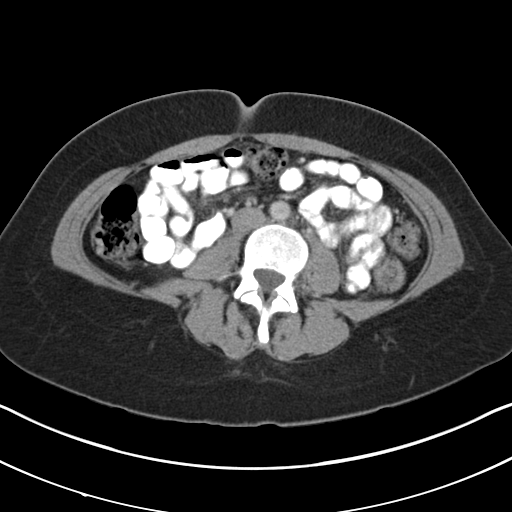
[im 55/93  soft-tissue]
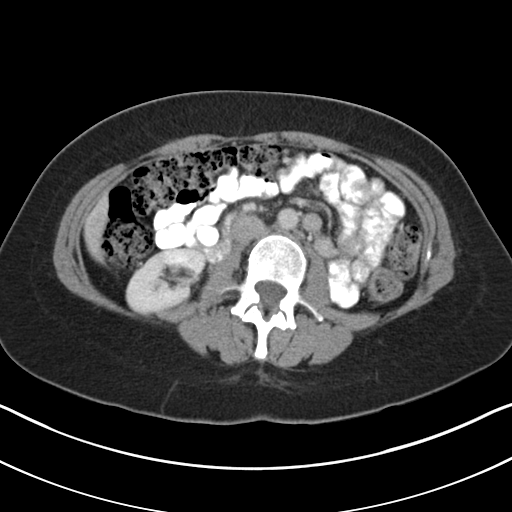
[im 60/93  soft-tissue]
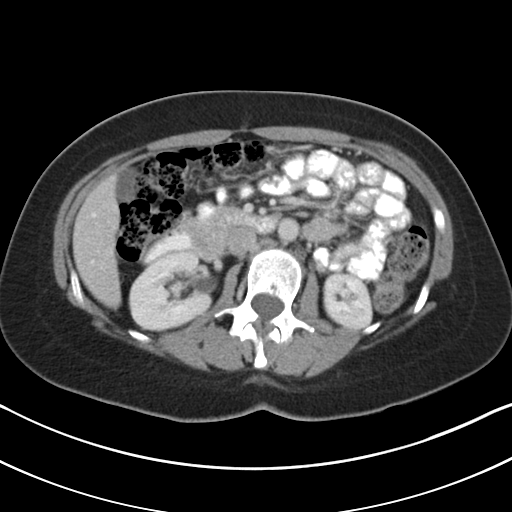
[im 60/93  bone]
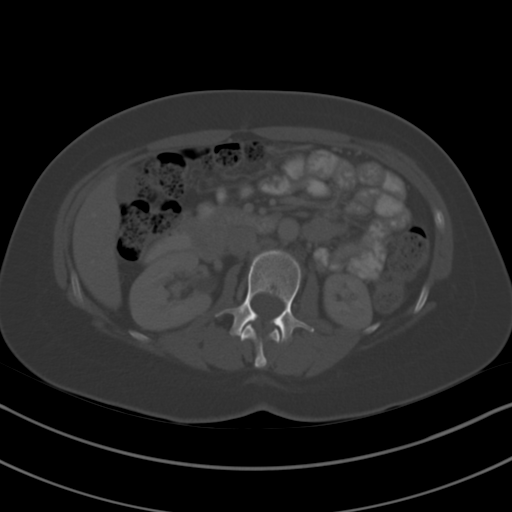
[im 65/93  soft-tissue]
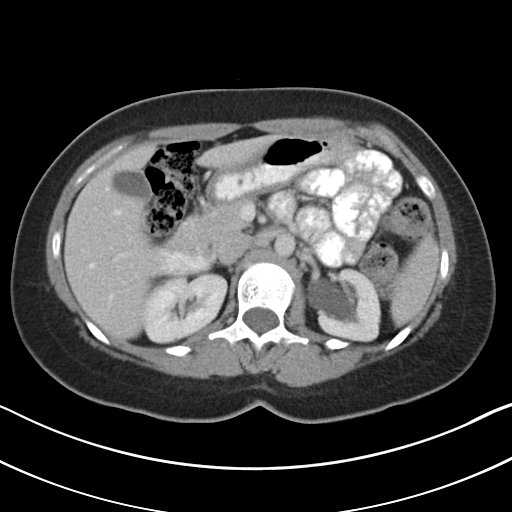
[im 71/93  soft-tissue]
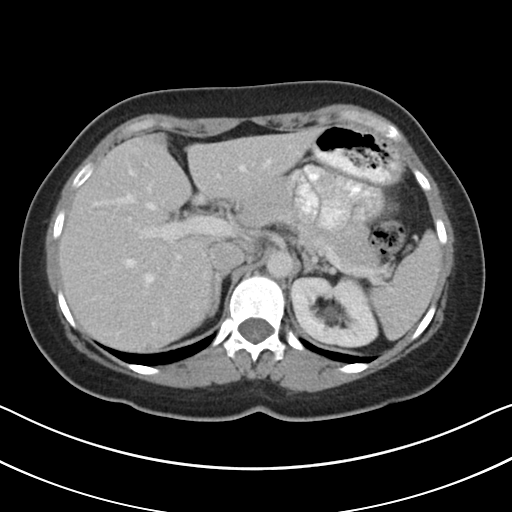
[im 71/93  lung]
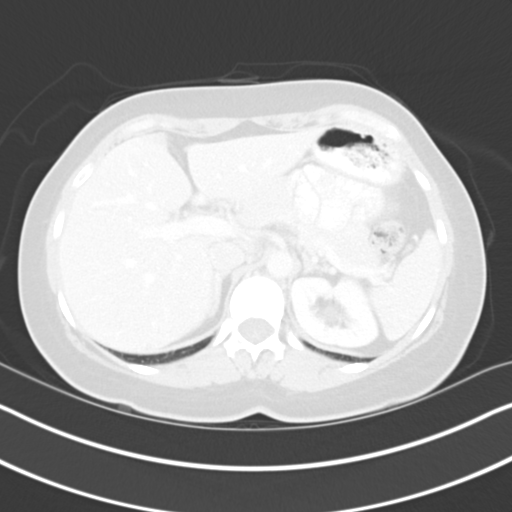
[im 76/93  lung]
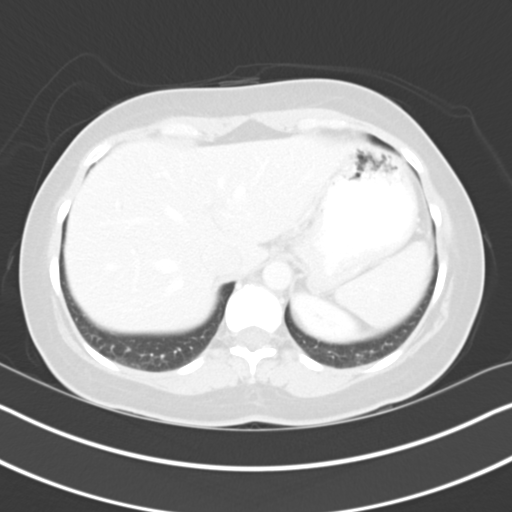
[im 82/93  soft-tissue]
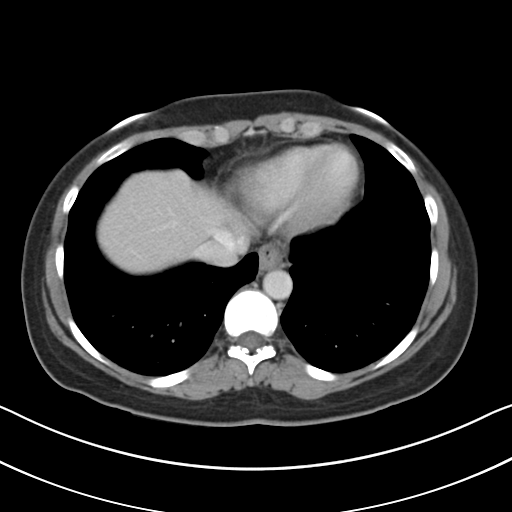
[im 82/93  lung]
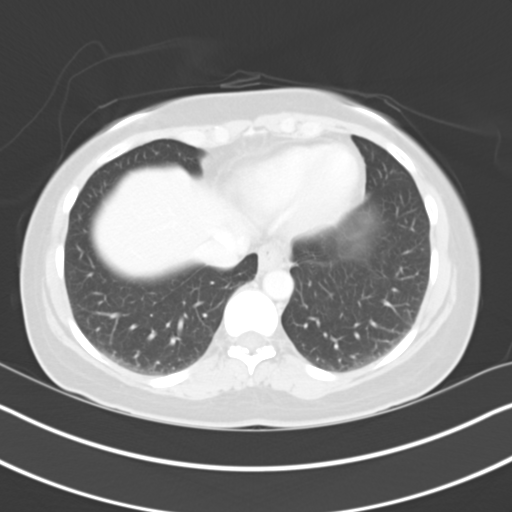
[im 87/93  soft-tissue]
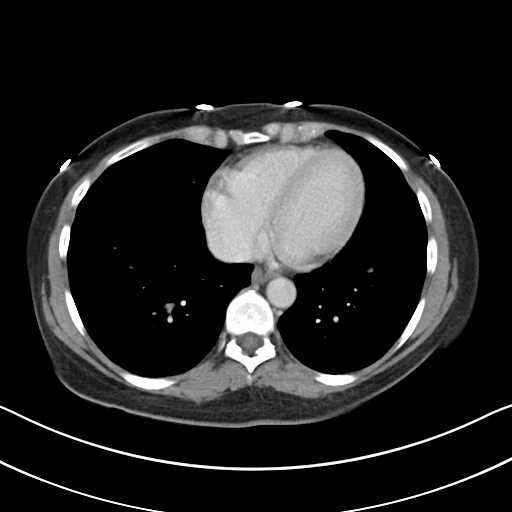
[im 87/93  lung]
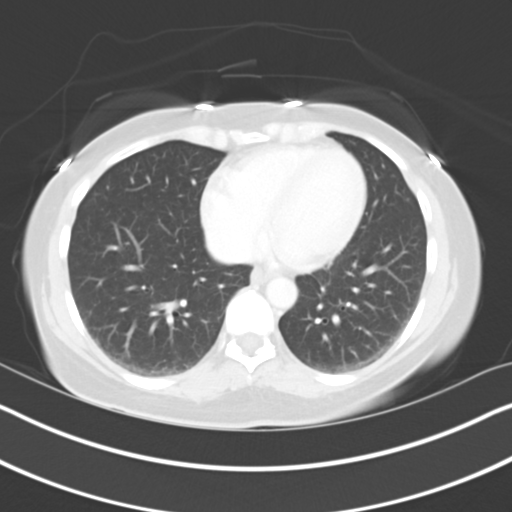

[14 of 32 positions shown; findings below may reference images not displayed]

FINDINGS: The lung bases are clear. The liver enhances with no focal
abnormality other than a probable cyst posteriorly in the right
lobe. No calcified gallstones are seen. The pancreas is normal in
size in the pancreatic duct is not dilated. The adrenal glands and
spleen are unremarkable. The stomach is moderately fluid distended
with no abnormality noted. The kidneys enhance with no calculus or
mass. Low-attenuation within the left renal pelvis on delayed images
is due to a left parapelvic renal cyst. No hydronephrosis is seen in
the proximal ureters are normal in caliber. The abdominal aorta is
normal in caliber. No adenopathy is noted.

The urinary bladder is moderately urine distended with no
abnormality noted. The uterus is normal in size. No adnexal lesion
is seen. No fluid is noted within the pelvis. There is a moderate
amount of feces throughout the colon. The terminal ileum is
unremarkable. The appendix is moderately well seen with no evidence
of appendicitis. Mild curvature of the lumbar spine convex to the
left is noted.
IMPRESSION: 1. No explanation for the patient's abdominal pain is seen. No renal
calculi are noted.
2. The appendix and terminal ileum are unremarkable.
3. Left parapelvic renal cyst.

## 2015-08-15 ENCOUNTER — Ambulatory Visit: Payer: 59 | Admitting: Cardiology

## 2015-08-20 ENCOUNTER — Telehealth: Payer: Self-pay | Admitting: Family Medicine

## 2015-08-20 NOTE — Telephone Encounter (Signed)
lmom of new appt time on 10/09/15 at 8:45

## 2015-09-26 ENCOUNTER — Ambulatory Visit: Payer: 59 | Admitting: Family Medicine

## 2015-10-09 ENCOUNTER — Encounter: Payer: 59 | Admitting: Family Medicine

## 2015-10-18 ENCOUNTER — Other Ambulatory Visit: Payer: Self-pay | Admitting: Family Medicine

## 2015-11-01 ENCOUNTER — Ambulatory Visit (INDEPENDENT_AMBULATORY_CARE_PROVIDER_SITE_OTHER): Payer: 59 | Admitting: Cardiology

## 2015-11-01 ENCOUNTER — Encounter: Payer: Self-pay | Admitting: Cardiology

## 2015-11-01 VITALS — BP 122/70 | HR 82 | Ht 66.0 in | Wt 152.0 lb

## 2015-11-01 DIAGNOSIS — I493 Ventricular premature depolarization: Secondary | ICD-10-CM

## 2015-11-01 DIAGNOSIS — Z8249 Family history of ischemic heart disease and other diseases of the circulatory system: Secondary | ICD-10-CM | POA: Diagnosis not present

## 2015-11-01 DIAGNOSIS — E785 Hyperlipidemia, unspecified: Secondary | ICD-10-CM

## 2015-11-01 NOTE — Patient Instructions (Signed)

## 2015-11-01 NOTE — Progress Notes (Signed)
Cardiology Office Note   Date:  11/01/2015   ID:  Megan Combs, DOB 1961-09-06, MRN [id number]  PCP:  Kelton Pillar, MD    Chief Complaint  Patient presents with  . Hyperlipidemia  . Irregular Heart Beat      History of Present Illness: Megan Combs is a 54y.o. female with a history of dyslipidemia, family history of CAD and palpitations with heart monitor showing PVC's. She now presents today for followup. She is doing well. She denies any chest pain, SOB, DOE, LE edema, dizziness or syncope.   Past Medical History  Diagnosis Date  . Depression   . Arthritis     right knee  . Fibromyalgia   . Degenerative disc disease   . Fibromyalgia   . Plantar fasciitis of left foot   . Salmonella poisoning   . IBS (irritable bowel syndrome)   . Hyperlipidemia   . PVC (premature ventricular contraction)   . Family history of early CAD   . Lumbar radiculitis   . Allergy   . Anemia   . Anxiety     Past Surgical History  Procedure Laterality Date  . Knee arthroscopy    . Tonsillectomy    . Laproscopy    . Knee arthroscopy    . Colonoscopy       Current Outpatient Prescriptions  Medication Sig Dispense Refill  . acetic acid-hydrocortisone (VOSOL-HC) otic solution Place 4 drops into both ears daily. Use for one week and then off 3 weeks 10 mL 5  . Azelastine HCl (ASTEPRO) 0.15 % SOLN Place 1-2 sprays into the nose 2 (two) times daily. 30 mL 2  . b complex vitamins tablet Take 1 tablet by mouth daily.    Marland Kitchen buPROPion (WELLBUTRIN XL) 300 MG 24 hr tablet Take 1 tablet (300 mg total) by mouth daily. 90 tablet 1  . Calcium Carbonate-Vitamin D (CALCIUM 500 + D) 500-125 MG-UNIT TABS Take 1 tablet by mouth daily.    . diclofenac (VOLTAREN) 75 MG EC tablet Take 1 tablet (75 mg total) by mouth 2 (two) times daily as needed. 60 tablet 1  . estradiol (ESTRACE) 1 MG tablet TAKE 1 TABLET (1 MG TOTAL) BY MOUTH DAILY. 90 tablet 0  . ferrous sulfate 325 (65 FE)  MG tablet Take 325 mg by mouth every Monday, Wednesday, and Friday.     . fish oil-omega-3 fatty acids 1000 MG capsule Take 1 g by mouth daily.    Marland Kitchen glucosamine-chondroitin 500-400 MG tablet Take 3 tablets by mouth daily.    . Lidocaine HCl (ASTERO EX) Place into the nose.    . Multiple Vitamin (MULITIVITAMIN WITH MINERALS) TABS Take 1 tablet by mouth daily.    . progesterone (PROMETRIUM) 100 MG capsule Take 1 capsule (100 mg total) by mouth daily. 90 capsule 1   No current facility-administered medications for this visit.    Allergies:   Dulcolax; Effexor; Erythromycin; Penicillins; Prednisone; Tape; and Zithromax    Social History:  The patient  reports that she has never smoked. She has never used smokeless tobacco. She reports that she does not drink alcohol or use illicit drugs.   Family History:  The patient's family history includes Arthritis in her mother; Cancer in her father and maternal grandmother; Colon cancer in her father; Diabetes in her father, maternal grandfather, and paternal grandfather; Esophageal cancer in her father; Heart attack in her  mother; Heart disease in her father, maternal grandfather, mother, and paternal grandfather; Hyperlipidemia in her brother and father; Hypertension in her brother and father; Mental illness in her maternal grandfather and mother; Prostate cancer in her father; Stomach cancer in her father; Stroke in her father and maternal grandfather. There is no history of Pancreatic cancer or Rectal cancer.    ROS:  Please see the history of present illness.   Otherwise, review of systems are positive for none.   All other systems are reviewed and negative.    PHYSICAL EXAM: VS:  LMP 10/28/2008 , BMI There is no weight on file to calculate BMI. GEN: Well nourished, well developed, in no acute distress HEENT: normal Neck: no JVD, carotid bruits, or masses Cardiac: RRR; no murmurs, rubs, or gallops,no edema  Respiratory:  clear to auscultation  bilaterally, normal work of breathing GI: soft, nontender, nondistended, + BS MS: no deformity or atrophy Skin: warm and dry, no rash Neuro:  Strength and sensation are intact Psych: euthymic mood, full affect   EKG:  EKG was ordered today and showed NSR     Recent Labs: 12/23/2014: ALT 16; BUN 29*; Creatinine, Ser 0.72; Platelets 198; Potassium 4.1; Sodium 135 07/05/2015: Hemoglobin 13.3    Lipid Panel    Component Value Date/Time   CHOL 185 07/11/2014 0939   TRIG 73.0 07/11/2014 0939   HDL 71.90 07/11/2014 0939   CHOLHDL 3 07/11/2014 0939   VLDL 14.6 07/11/2014 0939   LDLCALC 99 07/11/2014 0939      Wt Readings from Last 3 Encounters:  07/10/15 155 lb (70.308 kg)  07/05/15 153 lb (69.4 kg)  04/20/15 155 lb 6.4 oz (70.489 kg)    ASSESSMENT AND PLAN:    1.   Dyslipidemia - diet controlled. - I will get a copy from her PCP 2. Family history of CAD 3. Palpitation with Holter showing PVC's 4. PVC's - asymptomatic    Current medicines are reviewed at length with the patient today.  The patient does not have concerns regarding medicines.  The following changes have been made:  no change  Labs/ tests ordered today: See above Assessment and Plan No orders of the defined types were placed in this encounter.     Disposition:   FU with me in 1 year  Signed, Sueanne Margarita, MD  11/01/2015 3:57 PM    [facility] [address], [place], [place]  [address] Phone: [phone]; Fax: [phone]

## 2015-11-06 MED FILL — BUPROPION HCL XL 150 MG TAB: 150 | 30 days supply | Qty: 90 | Fill #2

## 2015-11-16 ENCOUNTER — Other Ambulatory Visit: Payer: Self-pay

## 2015-11-16 ENCOUNTER — Other Ambulatory Visit: Payer: Self-pay | Admitting: Internal Medicine

## 2015-11-16 ENCOUNTER — Ambulatory Visit
Admission: RE | Admit: 2015-11-16 | Discharge: 2015-11-16 | Disposition: A | Discharge status: Home / Self Care | Payer: 59 | Source: Ambulatory Visit | Attending: Internal Medicine | Admitting: Internal Medicine

## 2015-11-16 DIAGNOSIS — J329 Chronic sinusitis, unspecified: Secondary | ICD-10-CM

## 2015-11-16 MED FILL — FLUTICASONE PROP 50 MCG SPR: 50 | 30 days supply | Qty: 16 | Fill #0

## 2015-11-20 ENCOUNTER — Other Ambulatory Visit: Payer: Self-pay | Admitting: Internal Medicine

## 2015-11-20 ENCOUNTER — Ambulatory Visit
Admission: RE | Admit: 2015-11-20 | Discharge: 2015-11-20 | Disposition: A | Discharge status: Home / Self Care | Payer: 59 | Source: Ambulatory Visit | Attending: Internal Medicine | Admitting: Internal Medicine

## 2015-11-20 DIAGNOSIS — R05 Cough: Secondary | ICD-10-CM | POA: Diagnosis not present

## 2015-11-20 DIAGNOSIS — J341 Cyst and mucocele of nose and nasal sinus: Secondary | ICD-10-CM | POA: Diagnosis not present

## 2015-11-20 DIAGNOSIS — Z201 Contact with and (suspected) exposure to tuberculosis: Secondary | ICD-10-CM

## 2015-11-28 DIAGNOSIS — B369 Superficial mycosis, unspecified: Secondary | ICD-10-CM | POA: Diagnosis not present

## 2015-11-28 DIAGNOSIS — J309 Allergic rhinitis, unspecified: Secondary | ICD-10-CM | POA: Diagnosis not present

## 2015-11-28 DIAGNOSIS — H6243 Otitis externa in other diseases classified elsewhere, bilateral: Secondary | ICD-10-CM | POA: Diagnosis not present

## 2015-11-28 MED FILL — FLUTICASONE PROP 50 MCG SPR: 50 | 16 days supply | Qty: 16 | Fill #0 | Status: TO

## 2015-12-08 MED FILL — BUPROPION HCL XL 150 MG TAB: 150 | 30 days supply | Qty: 90 | Fill #3 | Status: TO

## 2015-12-14 MED FILL — PROGESTERONE 100 MG CAPSULE: 100 | 90 days supply | Qty: 90 | Fill #0 | Status: TO

## 2015-12-15 DIAGNOSIS — H35373 Puckering of macula, bilateral: Secondary | ICD-10-CM | POA: Diagnosis not present

## 2015-12-15 MED FILL — CIPROFLOXACIN 0.3% EYE DRO: 0.3 | 13 days supply | Qty: 5 | Fill #0

## 2015-12-15 MED FILL — DUREZOL 0.05% EYE DROPS: 0.05 | 25 days supply | Qty: 5 | Fill #0

## 2015-12-18 ENCOUNTER — Other Ambulatory Visit: Payer: Self-pay | Admitting: Ophthalmology

## 2015-12-25 SURGERY — Surgical Case
Anesthesia: *Unknown

## 2015-12-25 MED FILL — ESTRADIOL 1 MG TABLET: 1 | 84 days supply | Qty: 63 | Fill #1

## 2016-01-09 DIAGNOSIS — R21 Rash and other nonspecific skin eruption: Secondary | ICD-10-CM | POA: Diagnosis not present

## 2016-01-09 DIAGNOSIS — L299 Pruritus, unspecified: Secondary | ICD-10-CM | POA: Diagnosis not present

## 2016-01-09 DIAGNOSIS — F4321 Adjustment disorder with depressed mood: Secondary | ICD-10-CM | POA: Diagnosis not present

## 2016-01-09 MED FILL — MOMETASONE FUROATE 0.1% CRM: 0.1 | 7 days supply | Qty: 15 | Fill #0

## 2016-01-09 MED FILL — BUPROPION HCL XL 150 MG TAB: 150 | 30 days supply | Qty: 90 | Fill #0

## 2016-01-15 ENCOUNTER — Inpatient Hospital Stay (HOSPITAL_COMMUNITY)
Admission: RE | Admit: 2016-01-15 | Discharge: 2016-01-15 | Disposition: A | Discharge status: Home / Self Care | Payer: 59 | Source: Ambulatory Visit

## 2016-01-15 NOTE — Pre-Procedure Instructions (Signed)
Catrina H Cordoba  01/15/2016      [id number], [place] [address] Phone: [phone] Fax: [place], [place] [address] Phone: [phone] Fax: [phone]    Your procedure is scheduled on Monday, March 27th   Report to [facility] Admitting at 10:45  A.M.             (posted surgery time 12:45 pm - 2:34 pm)   Call this number if you have problems the morning of surgery:  [phone]   Remember:  Do not eat food or drink liquids after midnight Sunday.  Take these medicines the morning of surgery with A SIP OF WATER : Wellbutrin              4-5 days prior to surgery, STOP taking any vitamins, herbal supplements and anti-inflammatories   Do not wear jewelry, make-up or nail polish.  Do not wear lotions, powders, or perfumes.     Do not shave underarms & legs 48 hours prior to surgery.  Men may shave face and neck.  Do not bring valuables to the hospital.  [facility] is not responsible for any belongings or valuables.  Contacts, dentures or bridgework may not be worn into surgery.  Leave your suitcase in the car.  After surgery it may be brought to your room. Patients discharged the day of surgery will not be allowed to drive home.   Name and phone number of your driver:     Please read over the following fact sheets that you were given. Pain Booklet and Surgical Site Infection Prevention

## 2016-01-22 ENCOUNTER — Ambulatory Visit (HOSPITAL_COMMUNITY): Admission: RE | Admit: 2016-01-22 | Payer: 59 | Source: Ambulatory Visit | Admitting: Ophthalmology

## 2016-01-22 ENCOUNTER — Encounter (HOSPITAL_COMMUNITY): Admission: RE | Payer: Self-pay | Source: Ambulatory Visit

## 2016-01-22 DIAGNOSIS — H35372 Puckering of macula, left eye: Secondary | ICD-10-CM | POA: Diagnosis not present

## 2016-01-22 SURGERY — PARS PLANA VITRECTOMY WITH 25 GAUGE
Anesthesia: Monitor Anesthesia Care | Site: Eye | Laterality: Left

## 2016-01-23 DIAGNOSIS — H35372 Puckering of macula, left eye: Secondary | ICD-10-CM | POA: Diagnosis not present

## 2016-01-26 DIAGNOSIS — H4419 Other endophthalmitis: Secondary | ICD-10-CM | POA: Diagnosis not present

## 2016-01-26 MED FILL — OXYCODONE/APAP 5-325: 5-325 | 2 days supply | Qty: 10 | Fill #0

## 2016-01-26 MED FILL — DORZOLAMIDE-TIMOLOL EYE DRP: 22.3-6.8 | 90 days supply | Qty: 10 | Fill #0

## 2016-01-27 DIAGNOSIS — H4419 Other endophthalmitis: Secondary | ICD-10-CM | POA: Diagnosis not present

## 2016-01-28 DIAGNOSIS — H4419 Other endophthalmitis: Secondary | ICD-10-CM | POA: Diagnosis not present

## 2016-02-08 MED FILL — LATANOPROST 0.005% EYE DRP: 0.005 | 25 days supply | Qty: 3 | Fill #0

## 2016-02-08 MED FILL — LOTEMAX 0.5% GEL: 0.5 | 90 days supply | Qty: 5 | Fill #0

## 2016-02-12 MED FILL — BUPROPION HCL XL 150 MG TAB: 150 | 30 days supply | Qty: 90 | Fill #1

## 2016-02-16 MED FILL — FLUTICASONE PROP 50 MCG SPR: 50 | 30 days supply | Qty: 16 | Fill #0

## 2016-02-27 DIAGNOSIS — J343 Hypertrophy of nasal turbinates: Secondary | ICD-10-CM | POA: Diagnosis not present

## 2016-02-27 DIAGNOSIS — J302 Other seasonal allergic rhinitis: Secondary | ICD-10-CM | POA: Diagnosis not present

## 2016-02-27 DIAGNOSIS — H608X3 Other otitis externa, bilateral: Secondary | ICD-10-CM | POA: Diagnosis not present

## 2016-03-01 MED FILL — ESTRADIOL 1 MG TABLET: 1 | 30 days supply | Qty: 30 | Fill #0

## 2016-03-04 MED FILL — ATROPINE 1% EYE DROPS: 1 | 50 days supply | Qty: 5 | Fill #0

## 2016-03-12 MED FILL — BUPROPION HCL XL 150 MG TAB: 150 | 30 days supply | Qty: 90 | Fill #0

## 2016-03-18 MED FILL — PROGESTERONE 100 MG CAPSULE: 100 | 90 days supply | Qty: 90 | Fill #0

## 2016-03-20 ENCOUNTER — Other Ambulatory Visit (HOSPITAL_COMMUNITY)
Admission: RE | Admit: 2016-03-20 | Discharge: 2016-03-20 | Disposition: A | Discharge status: Home / Self Care | Payer: 59 | Source: Ambulatory Visit | Attending: Internal Medicine | Admitting: Internal Medicine

## 2016-03-20 ENCOUNTER — Other Ambulatory Visit: Payer: Self-pay | Admitting: Internal Medicine

## 2016-03-20 DIAGNOSIS — Z01419 Encounter for gynecological examination (general) (routine) without abnormal findings: Secondary | ICD-10-CM | POA: Insufficient documentation

## 2016-03-20 DIAGNOSIS — H4312 Vitreous hemorrhage, left eye: Secondary | ICD-10-CM | POA: Diagnosis not present

## 2016-03-20 DIAGNOSIS — Z1151 Encounter for screening for human papillomavirus (HPV): Secondary | ICD-10-CM | POA: Diagnosis not present

## 2016-03-20 DIAGNOSIS — Z0000 Encounter for general adult medical examination without abnormal findings: Secondary | ICD-10-CM | POA: Diagnosis not present

## 2016-03-21 DIAGNOSIS — H5213 Myopia, bilateral: Secondary | ICD-10-CM | POA: Diagnosis not present

## 2016-03-22 DIAGNOSIS — H4312 Vitreous hemorrhage, left eye: Secondary | ICD-10-CM | POA: Diagnosis not present

## 2016-03-22 LAB — CYTOLOGY - PAP

## 2016-03-27 ENCOUNTER — Telehealth: Payer: Self-pay | Admitting: Cardiology

## 2016-03-27 ENCOUNTER — Ambulatory Visit: Payer: 59 | Admitting: Nurse Practitioner

## 2016-03-27 ENCOUNTER — Encounter: Payer: Self-pay | Admitting: Physician Assistant

## 2016-03-27 ENCOUNTER — Ambulatory Visit (INDEPENDENT_AMBULATORY_CARE_PROVIDER_SITE_OTHER): Payer: 59 | Admitting: Physician Assistant

## 2016-03-27 VITALS — BP 120/80 | HR 80 | Ht 66.0 in | Wt 155.0 lb

## 2016-03-27 DIAGNOSIS — E785 Hyperlipidemia, unspecified: Secondary | ICD-10-CM | POA: Diagnosis not present

## 2016-03-27 DIAGNOSIS — R002 Palpitations: Secondary | ICD-10-CM | POA: Diagnosis not present

## 2016-03-27 DIAGNOSIS — I493 Ventricular premature depolarization: Secondary | ICD-10-CM

## 2016-03-27 DIAGNOSIS — Z8249 Family history of ischemic heart disease and other diseases of the circulatory system: Secondary | ICD-10-CM | POA: Diagnosis not present

## 2016-03-27 DIAGNOSIS — R079 Chest pain, unspecified: Secondary | ICD-10-CM

## 2016-03-27 MED ORDER — NITROGLYCERIN 0.4 MG SL SUBL: 0.4000 mg | SUBLINGUAL_TABLET | SUBLINGUAL | Status: DC | PRN

## 2016-03-27 MED FILL — NITROGLYCERIN 0.4 MG TAB SL: 0.4 | 5 days supply | Qty: 25 | Fill #0

## 2016-03-27 NOTE — Patient Instructions (Signed)
Medication Instructions:  Your physician has recommended you make the following change in your medication:  1. Start Nitro ( 0.4mg  ) sublingual, take every 5 minutes up to three times if no relief, than call 911   Labwork: -None  Testing/Procedures: Your physician has requested that you have en exercise stress myoview. For further information please visit [email or web address]. Please follow instruction sheet, as given.    Follow-Up: Your physician recommends that you keep your scheduled  follow-up appointment with Dr. Radford Pax.   Any Other Special Instructions Will Be Listed Below (If Applicable).     If you need a refill on your cardiac medications before your next appointment, please call your pharmacy.

## 2016-03-27 NOTE — Progress Notes (Signed)
Cardiology Office Note    Date:  03/27/2016   ID:  Megan Combs, DOB November 11, 1960, MRN [id number]  PCP:  Kelton Pillar, MD  Cardiologist:  Dr. Radford Pax   Chief Complaint: Chest pain  History of Present Illness:   Megan Combs is a 55 y.o. female with hx of palpitations with heart monitor showing PVCs, HLD, depression, fibromyalgia and family hx of early CAD who added to schedule for evaluation of chest pain. She works as Marine scientist at [employer].   She was doing well on cardiac stand point when seen last by Dr. Radford Pax 11/01/15. The patient had normal ETT and echo (LVEF of 56%)  04/2012. She had  Left eye surgery 01/22/16/. S/p developed intraocular bleeding & infection and currently being treated for this and improved. She is under lot of stress since then. She had one episode of chest discomfort post op in recovery room which resolved with taking a deep breath.   Yesterday 5/30 she went to dance class for about 90 minutes, first physical exertion after surgery. She was sweating and had heart racing at that time but no SOB or chest pain. Later in evening at home, she was under lot of stress due to job and developed sudden onset substernal chest pressure. She rates pressure 7/10 and lasted for 5-7 mins and eased off. She took 2 baby aspirin and laid down and eventually pain resolved in about 15-20 minutes. She admits to having SOB but no nausea or diaphoresis. She also had L arm tingling and numbness but no radiation of pain. The patient denies nausea, vomiting, fever, palpitations, orthopnea, PND, dizziness, syncope, cough, congestion, abdominal pain, hematochezia, melena, lower extremity edema.   Past Medical History  Diagnosis Date  . Depression   . Arthritis     right knee  . Fibromyalgia   . Degenerative disc disease   . Fibromyalgia   . Plantar fasciitis of left foot   . Salmonella poisoning   . IBS (irritable bowel syndrome)   . Hyperlipidemia   . PVC (premature ventricular  contraction)   . Family history of early CAD   . Lumbar radiculitis   . Allergy   . Anemia   . Anxiety     Past Surgical History  Procedure Laterality Date  . Knee arthroscopy    . Tonsillectomy    . Laproscopy    . Knee arthroscopy    . Colonoscopy      Current Medications: Prior to Admission medications   Medication Sig Start Date End Date Taking? Authorizing Provider  acetic acid-hydrocortisone (VOSOL-HC) otic solution Place 4 drops into both ears daily. Use for one week and then off 3 weeks 07/04/15   Tereasa Coop, PA-C  b complex vitamins tablet Take 1 tablet by mouth daily.    Historical Provider, MD  buPROPion (WELLBUTRIN XL) 150 MG 24 hr tablet Take 150 mg by mouth daily.    Historical Provider, MD  buPROPion (WELLBUTRIN XL) 300 MG 24 hr tablet Take 300 mg by mouth daily.    Historical Provider, MD  Calcium Carbonate-Vitamin D (CALCIUM 500 + D) 500-125 MG-UNIT TABS Take 1 tablet by mouth daily.    Historical Provider, MD  diclofenac (VOLTAREN) 75 MG EC tablet Take 1 tablet (75 mg total) by mouth 2 (two) times daily as needed. 03/28/15   Elby Beck, FNP  estradiol (ESTRACE) 1 MG tablet TAKE 1 TABLET (1 MG TOTAL) BY MOUTH DAILY. 10/19/15   Elby Beck, FNP  ferrous sulfate 325 (65 FE) MG tablet Take 325 mg by mouth once a week.     Historical Provider, MD  fish oil-omega-3 fatty acids 1000 MG capsule Take 1 g by mouth daily.    Historical Provider, MD  glucosamine-chondroitin 500-400 MG tablet Take 3 tablets by mouth daily.    Historical Provider, MD  Multiple Vitamin (MULITIVITAMIN WITH MINERALS) TABS Take 1 tablet by mouth daily.    Historical Provider, MD  Probiotic Product (PROBIOTIC DAILY PO) Take 1 Can by mouth daily.    Historical Provider, MD  progesterone (PROMETRIUM) 100 MG capsule Take 1 capsule (100 mg total) by mouth daily. 03/28/15   Elby Beck, FNP    Allergies:   Dulcolax; Effexor; Erythromycin; Penicillins; Prednisone; Tape; and Zithromax     Social History   Social History  . Marital Status: Married    Spouse Name: N/A  . Number of Children: 0  . Years of Education: N/A   Occupational History  . RN   .  [employer]   Social History Main Topics  . Smoking status: Never Smoker   . Smokeless tobacco: Never Used  . Alcohol Use: No  . Drug Use: No  . Sexual Activity: Yes    Birth Control/ Protection: Post-menopausal   Other Topics Concern  . None   Social History Narrative     Family History:  The patient's family history includes Arthritis in her mother; Cancer in her father and maternal grandmother; Colon cancer in her father; Diabetes in her father, maternal grandfather, and paternal grandfather; Esophageal cancer in her father; Heart attack in her mother; Heart disease in her father, maternal grandfather, mother, and paternal grandfather; Hyperlipidemia in her brother and father; Hypertension in her brother and father; Mental illness in her maternal grandfather and mother; Prostate cancer in her father; Stomach cancer in her father; Stroke in her father and maternal grandfather. There is no history of Pancreatic cancer or Rectal cancer.   ROS:   Please see the history of present illness.    ROS All other systems reviewed and are negative.   PHYSICAL EXAM:   VS:  BP 120/80 mmHg  Pulse 80  Ht 5\' 6"  (1.676 m)  Wt 155 lb (70.308 kg)  BMI 25.03 kg/m2  LMP 10/28/2008   GEN: Well nourished, well developed, in no acute distress HEENT: normal Neck: no JVD, carotid bruits, or masses Cardiac: RRR; no murmurs, rubs, or gallops,no edema  Respiratory:  clear to auscultation bilaterally, normal work of breathing GI: soft, nontender, nondistended, + BS MS: no deformity or atrophy Skin: warm and dry, no rash Neuro:  Alert and Oriented x 3, Strength and sensation are intact Psych: euthymic mood, full affect  Wt Readings from Last 3 Encounters:  03/27/16 155 lb (70.308 kg)  11/01/15 152 lb (68.947 kg)   07/10/15 155 lb (70.308 kg)      Studies/Labs Reviewed:   EKG:  EKG is ordered today.  The ekg ordered today demonstrates NSR, HR 81  Recent Labs: 07/05/2015: Hemoglobin 13.3   Lipid Panel    Component Value Date/Time   CHOL 185 07/11/2014 0939   TRIG 73.0 07/11/2014 0939   HDL 71.90 07/11/2014 0939   CHOLHDL 3 07/11/2014 0939   VLDL 14.6 07/11/2014 0939   LDLCALC 99 07/11/2014 0939    Additional studies/ records that were reviewed today include:   ETT 04/2012: normal Echo 04/2012: normal. LV EF of 56%, trace MR   ASSESSMENT & PLAN:  1. Chest pain - She is under lot of stress recently since surgery. This could be etiology. 7/10 chest pressure with SOB. ? Radiation of pain. Had tingling and numbness of left arm which resolved after taking 2 baby aspirin. Will get treadmill Myoview for risk stratifications. PRN SL nitro. F/u with Dr. Radford Pax 6-8 weeks, sooner if no improvement/worsening symptoms or abnormal stress test. Will hold adding daily ASA given recent intraocular bleeding after surgery, consider adding during next office visit.   2. Dyslipidemia - Diet controlled. Managed by PCP yearly.   3. Palpitation - Holter monitor in past showed PVC's. Asymptomatic.   4. Family hx of early CAD   Medication Adjustments/Labs and Tests Ordered: Current medicines are reviewed at length with the patient today.  Concerns regarding medicines are outlined above.  Medication changes, Labs and Tests ordered today are listed in the Patient Instructions below. Patient Instructions  Medication Instructions:  Your physician has recommended you make the following change in your medication:  1. Start Nitro ( 0.4mg  ) sublingual, take every 5 minutes up to three times if no relief, than call 911   Labwork: -None  Testing/Procedures: Your physician has requested that you have en exercise stress myoview. For further information please visit [email or web address]. Please follow instruction  sheet, as given.    Follow-Up: Your physician recommends that you keep your scheduled  follow-up appointment with Dr. Radford Pax.   Any Other Special Instructions Will Be Listed Below (If Applicable).     If you need a refill on your cardiac medications before your next appointment, please call your pharmacy.       Jarrett Soho, [place]  03/27/2016 12:17 PM    [facility] [address], [place], [place]  [address] Phone: [phone]; Fax: [phone]

## 2016-03-27 NOTE — Telephone Encounter (Signed)
  Pt c/o of Chest Pain: STAT if CP now or developed within 24 hours  1. Are you having CP right now? no  2. Are you experiencing any other symptoms (ex. SOB, nausea, vomiting, sweating)? Some SOB before CP last night   3. How long have you been experiencing CP? Last occurrence- last night 5/30- started w/ pressure, then SOB, then numbness in lft arm, then CP.   4. Is your CP continuous or coming and going? Came/went  5. Have you taken Nitroglycerin? No, stated it was relieved w/ rest.  ?.

## 2016-03-27 NOTE — Telephone Encounter (Signed)
Received call transferred from operator and spoke with pt.  She reports having out patient eye surgery on 3/27. Had one episode of chest pressure right after surgery while in recovery. No treatment needed. Took a couple deep breaths and this pain went away.  Had eye infection post op and is currently being treated with eye drops. Eye has improved and she has almost finished treatment.  Under a great deal of stress. Has not been exercising since surgery. Yesterday did dance class in the morning.  Heart rate was elevated during dance but no pain. She got home and felt tired.  At 9:45 after being on phone for 30 minutes she developed left sided chest pressure. She took a few deep breaths and pressure did not go away. Felt anxious. She laid down and had left arm tingling and numbness.  After about 15 minutes she placed two 81 mg tablets of ASA under her tongue.  Pressure went away as she was taking ASA. Was not having palpitations at time of event.   No pain since episode last night.  Tossed and turned during the night but is feeling OK this AM. Will review with Dr. Radford Pax.

## 2016-03-27 NOTE — Telephone Encounter (Signed)
Scheduled patient with Ignacia Bayley today at 1130.  Patient was grateful for assistance.

## 2016-03-27 NOTE — Telephone Encounter (Signed)
Needs to see extender today

## 2016-04-01 MED FILL — ESTRADIOL 1 MG TABLET: 1 | 30 days supply | Qty: 30 | Fill #1

## 2016-04-08 ENCOUNTER — Telehealth (HOSPITAL_COMMUNITY): Payer: Self-pay | Admitting: *Deleted

## 2016-04-08 NOTE — Telephone Encounter (Signed)
Patient given detailed instructions per Myocardial Perfusion Study Information Sheet for the test on 04/12/16. Patient notified to arrive 15 minutes early and that it is imperative to arrive on time for appointment to keep from having the test rescheduled.  If you need to cancel or reschedule your appointment, please call the office within 24 hours of your appointment. Failure to do so may result in a cancellation of your appointment, and a $50 no show fee. Patient verbalized understanding. Seyon Strader J Jiovany Scheffel, RN  

## 2016-04-12 ENCOUNTER — Ambulatory Visit (HOSPITAL_COMMUNITY): Payer: 59 | Attending: Internal Medicine

## 2016-04-12 ENCOUNTER — Encounter (HOSPITAL_COMMUNITY): Payer: Self-pay

## 2016-04-12 DIAGNOSIS — R079 Chest pain, unspecified: Secondary | ICD-10-CM | POA: Insufficient documentation

## 2016-04-12 DIAGNOSIS — R0602 Shortness of breath: Secondary | ICD-10-CM | POA: Diagnosis not present

## 2016-04-12 DIAGNOSIS — R002 Palpitations: Secondary | ICD-10-CM | POA: Diagnosis not present

## 2016-04-12 DIAGNOSIS — Z8249 Family history of ischemic heart disease and other diseases of the circulatory system: Secondary | ICD-10-CM | POA: Diagnosis not present

## 2016-04-12 LAB — MYOCARDIAL PERFUSION IMAGING
CHL CUP NUCLEAR SDS: 0
CHL CUP RESTING HR STRESS: 68 {beats}/min
CSEPEDS: 0 s
CSEPEW: 10.4 METS
Exercise duration (min): 9 min
LV sys vol: 43 mL
LVDIAVOL: 91 mL (ref 46–106)
MPHR: 166 {beats}/min
Peak HR: 148 {beats}/min
Percent HR: 89 %
RATE: 0.23
SRS: 4
SSS: 4
TID: 0.91

## 2016-04-12 MED ORDER — TECHNETIUM TC 99M TETROFOSMIN IV KIT
32.7000 | PACK | Freq: Once | INTRAVENOUS | Status: AC | PRN
  Administered 2016-04-12: 32.7 via INTRAVENOUS
  Filled 2016-04-12: qty 33

## 2016-04-12 MED ORDER — TECHNETIUM TC 99M TETROFOSMIN IV KIT
10.2000 | PACK | Freq: Once | INTRAVENOUS | Status: AC | PRN
  Administered 2016-04-12: 10 via INTRAVENOUS
  Filled 2016-04-12: qty 10

## 2016-04-15 ENCOUNTER — Encounter: Payer: Self-pay | Admitting: Physician Assistant

## 2016-04-15 MED FILL — BUPROPION HCL XL 150 MG TAB: 150 | 30 days supply | Qty: 90 | Fill #1

## 2016-04-15 MED FILL — FLUTICASONE PROP 50 MCG SPR: 50 | 30 days supply | Qty: 16 | Fill #1

## 2016-04-17 ENCOUNTER — Telehealth: Payer: Self-pay | Admitting: Cardiology

## 2016-04-17 NOTE — Telephone Encounter (Signed)
Pt returned call to [employer] call [phone]

## 2016-04-17 NOTE — Telephone Encounter (Signed)
Returne pts call and discussed her stress test results. She verbalized appreciation for our care.

## 2016-04-22 ENCOUNTER — Encounter: Payer: Self-pay | Admitting: Physician Assistant

## 2016-04-23 ENCOUNTER — Telehealth: Payer: Self-pay | Admitting: Cardiology

## 2016-04-23 NOTE — Telephone Encounter (Signed)
Increase activity as tolerated. Please schedule appointment with PA/NP this week.

## 2016-04-23 NOTE — Telephone Encounter (Signed)
Follow Up:   Pt called asking for Megan Combs,said she had talked to her a few weeks ago about her test results. Pt says she still have some questions she wants to talk to somebody about.

## 2016-04-23 NOTE — Telephone Encounter (Signed)
I will forward to [employer]

## 2016-04-23 NOTE — Telephone Encounter (Signed)
Patient wanted to clarify that Nuclear stress is low risk and she can resume regular activities.She was concerned that EF was 53 % .She did read full report on my- chart and would like to come sooner than 06/03/16 apt with Dr Radford Pax. I told her she could change her apt date if it would help her feel more comfortable as she has been through a lot of stress recently.She agrees to see an NP or PA if she cannot get an apt with Dr Radford Pax sooner

## 2016-04-24 DIAGNOSIS — R079 Chest pain, unspecified: Secondary | ICD-10-CM | POA: Diagnosis not present

## 2016-04-24 DIAGNOSIS — N951 Menopausal and female climacteric states: Secondary | ICD-10-CM | POA: Diagnosis not present

## 2016-04-24 NOTE — Telephone Encounter (Addendum)
Leanor Kail, PA at 04/23/2016 8:10 PM     Status: Signed       Expand All Collapse All   Increase activity as tolerated. Please schedule appointment with PA/NP this week.        (See patient email 6/26).   Scheduled patient tomorrow, 6/28 with Lyda Jester. She would like to review testing in greater detail with a provider and to address questions she has about exercise. She was grateful for call.

## 2016-04-25 ENCOUNTER — Encounter: Payer: Self-pay | Admitting: Cardiology

## 2016-04-25 ENCOUNTER — Ambulatory Visit (INDEPENDENT_AMBULATORY_CARE_PROVIDER_SITE_OTHER): Payer: 59 | Admitting: Cardiology

## 2016-04-25 VITALS — BP 118/70 | HR 82 | Ht 64.0 in | Wt 154.0 lb

## 2016-04-25 DIAGNOSIS — R0789 Other chest pain: Secondary | ICD-10-CM | POA: Diagnosis not present

## 2016-04-25 MED FILL — ESTRADIOL 1 MG TABLET: 1 | 90 days supply | Qty: 45 | Fill #0

## 2016-04-25 NOTE — Patient Instructions (Signed)
Medication Instructions:  Your physician recommends that you continue on your current medications as directed. Please refer to the Current Medication list given to you today.   Labwork: NONE  Testing/Procedures: NONE  Follow-Up: KEEP YOUR APPT WITH DR. Radford Pax  Any Other Special Instructions Will Be Listed Below (If Applicable).     If you need a refill on your cardiac medications before your next appointment, please call your pharmacy.

## 2016-04-25 NOTE — Progress Notes (Signed)
04/25/2016 Megan Combs   May 04, 1961  [id number]  Primary Physician Kelton Pillar, MD Primary Cardiologist: Dr. Radford Pax   Reason for Visit/CC: f/u for CP s/p stress test  HPI:  Megan Combs is a 55 y.o. female with hx of palpitations with heart monitor showing PVCs, HLD, depression, fibromyalgia and family hx of early CAD who was recently seen in [facility] for chest pain. She works as Marine scientist at [employer]. She is followed by Dr. Radford Pax.   She was doing well from a cardiac stand point when seen last by Dr. Radford Pax 11/01/15. The patient had normal ETT and echo (LVEF of 56%)in 04/2012. She had Left eye surgery 01/22/16. Following surgery, she developed intraocular bleeding & infection but this has improved. She had been under a lot of stress since then. She also developed CP and was seen in our clinic on 03/27/16. A treadmill Myoview was arranged to risk stratify. This was a low risk study w/o ischemia. EF was estimated at 53%.  She presents back to clinic to review her results. She reports that she has done well. She has had twinges of left sided chest pain off and on but not as severe and prolonged as her initial CP episode. Symptoms have been very brief, lasting only 30 sec-1 min. Described as left sided chest pressure. No triggering/ exacerbating factors. She denies any exertional CP. Her main concern is her family history. Mother had an IM in her late 60s.     Current Outpatient Prescriptions  Medication Sig Dispense Refill  . acetic acid-hydrocortisone (VOSOL-HC) otic solution Place 4 drops into both ears daily. Use for one week and then off 3 weeks 10 mL 5  . aspirin 81 MG tablet Take 81 mg by mouth daily.    Marland Kitchen b complex vitamins tablet Take 1 tablet by mouth daily.    Marland Kitchen buPROPion (WELLBUTRIN XL) 150 MG 24 hr tablet Take 150 mg by mouth daily.    Marland Kitchen buPROPion (WELLBUTRIN XL) 300 MG 24 hr tablet Take 300 mg by mouth daily.    . Calcium Carbonate-Vitamin D (CALCIUM 500 + D) 500-125  MG-UNIT TABS Take 1 tablet by mouth daily.    . diclofenac (VOLTAREN) 75 MG EC tablet Take 75 mg by mouth 2 (two) times daily as needed for mild pain or moderate pain.    Marland Kitchen estradiol (ESTRACE) 1 MG tablet TAKE 1 TABLET (1 MG TOTAL) BY MOUTH DAILY. 90 tablet 0  . ferrous sulfate 325 (65 FE) MG tablet Take 325 mg by mouth 3 (three) times a week.     . fish oil-omega-3 fatty acids 1000 MG capsule Take 1 g by mouth daily.    Marland Kitchen glucosamine-chondroitin 500-400 MG tablet Take 3 tablets by mouth daily.    . Multiple Vitamin (MULITIVITAMIN WITH MINERALS) TABS Take 1 tablet by mouth daily.    . nitroGLYCERIN (NITROSTAT) 0.4 MG SL tablet Place 1 tablet (0.4 mg total) under the tongue every 5 (five) minutes as needed for chest pain. 25 tablet 3  . Probiotic Product (PROBIOTIC DAILY PO) Take 1 Can by mouth daily.    . progesterone (PROMETRIUM) 100 MG capsule Take 1 capsule (100 mg total) by mouth daily. 90 capsule 1   No current facility-administered medications for this visit.    Allergies  Allergen Reactions  . Dulcolax [Bisacodyl]     Dehydration   . Effexor [Venlafaxine]     Intolerant SSRI/SNRI  . Erythromycin Nausea And Vomiting  . Penicillins Hives  .  Prednisone     Paranoid , anxious, hyper emotionally, could not sleep, odd dreams  . Tape Dermatitis  . Zithromax [Azithromycin] Rash    Social History   Social History  . Marital Status: Married    Spouse Name: N/A  . Number of Children: 0  . Years of Education: N/A   Occupational History  . RN   .  [employer]   Social History Main Topics  . Smoking status: Never Smoker   . Smokeless tobacco: Never Used  . Alcohol Use: No  . Drug Use: No  . Sexual Activity: Yes    Birth Control/ Protection: Post-menopausal   Other Topics Concern  . Not on file   Social History Narrative     Review of Systems: General: negative for chills, fever, night sweats or weight changes.  Cardiovascular: negative for chest pain, dyspnea  on exertion, edema, orthopnea, palpitations, paroxysmal nocturnal dyspnea or shortness of breath Dermatological: negative for rash Respiratory: negative for cough or wheezing Urologic: negative for hematuria Abdominal: negative for nausea, vomiting, diarrhea, bright red blood per rectum, melena, or hematemesis Neurologic: negative for visual changes, syncope, or dizziness All other systems reviewed and are otherwise negative except as noted above.    Blood pressure 118/70, pulse 82, height 5\' 4"  (1.626 m), weight 154 lb (69.854 kg), last menstrual period 10/28/2008.  General appearance: alert, cooperative and no distress Neck: no carotid bruit and no JVD Lungs: clear to auscultation bilaterally Heart: regular rate and rhythm, S1, S2 normal, no murmur, click, rub or gallop Extremities: no LEE Pulses: 2+ and symmetric Skin: warm and dry Neurologic: Grossly normal  EKG not performed  ASSESSMENT AND PLAN:   1. Chest Pain: her initial CP episode several weeks ago was concerning and a bit more typical for possible cardiac etiology however she had a low risk Myoview with normal LVEF. She has had 1-2 recurrent mild CP episodes since then that have been more atypical, lasting only 30 sec-1 min. She denies any exertional CP. She has added daily low dose ASA to her medical regimen. BP is well controlled. She has PRN SL NTG to use as needed. Will plan to continue to monitor. She has f/u with Dr. Radford Pax in 4 weeks. Patient instructed to keep a diary of any recurrent CP including timing, duration, exacerbating/ alleviating factors. If no significant recurrent symptoms, would not recommend any additional testing. However if worrisome CP recurrence, may consider definitive LHC. Will defer this to Dr. Radford Pax.    Lyda Jester PA-C 04/25/2016 12:46 PM

## 2016-04-26 DIAGNOSIS — F4310 Post-traumatic stress disorder, unspecified: Secondary | ICD-10-CM | POA: Diagnosis not present

## 2016-04-26 DIAGNOSIS — F331 Major depressive disorder, recurrent, moderate: Secondary | ICD-10-CM | POA: Diagnosis not present

## 2016-05-01 DIAGNOSIS — F4310 Post-traumatic stress disorder, unspecified: Secondary | ICD-10-CM | POA: Diagnosis not present

## 2016-05-01 DIAGNOSIS — F331 Major depressive disorder, recurrent, moderate: Secondary | ICD-10-CM | POA: Diagnosis not present

## 2016-05-08 DIAGNOSIS — L309 Dermatitis, unspecified: Secondary | ICD-10-CM | POA: Diagnosis not present

## 2016-05-08 MED FILL — HYDROCORTISONE 2.5% OINT: 2.5 | 14 days supply | Qty: 28 | Fill #0

## 2016-05-13 DIAGNOSIS — F4321 Adjustment disorder with depressed mood: Secondary | ICD-10-CM | POA: Diagnosis not present

## 2016-05-15 MED FILL — BUPROPION HCL XL 150 MG TAB: 150 | 90 days supply | Qty: 270 | Fill #0

## 2016-05-15 MED FILL — traZODone HCL 50 MG TABS: 50 | 90 days supply | Qty: 90 | Fill #0

## 2016-05-17 DIAGNOSIS — F331 Major depressive disorder, recurrent, moderate: Secondary | ICD-10-CM | POA: Diagnosis not present

## 2016-05-17 DIAGNOSIS — F4310 Post-traumatic stress disorder, unspecified: Secondary | ICD-10-CM | POA: Diagnosis not present

## 2016-05-22 DIAGNOSIS — F331 Major depressive disorder, recurrent, moderate: Secondary | ICD-10-CM | POA: Diagnosis not present

## 2016-05-22 DIAGNOSIS — F4310 Post-traumatic stress disorder, unspecified: Secondary | ICD-10-CM | POA: Diagnosis not present

## 2016-05-25 MED FILL — FLUTICASONE PROP 50 MCG SPR: 50 | 30 days supply | Qty: 16 | Fill #2

## 2016-05-27 MED FILL — HYDROCORTISON-ACETIC ACID S: 1-2 | 28 days supply | Qty: 10 | Fill #0

## 2016-05-31 MED FILL — TEMAZEPAM 15 MG CAPSULE: 15 | 30 days supply | Qty: 30 | Fill #0

## 2016-06-03 ENCOUNTER — Ambulatory Visit: Payer: 59 | Admitting: Cardiology

## 2016-06-21 ENCOUNTER — Other Ambulatory Visit: Payer: Self-pay | Admitting: Family Medicine

## 2016-06-21 DIAGNOSIS — N951 Menopausal and female climacteric states: Secondary | ICD-10-CM

## 2016-06-28 MED FILL — ESTRADIOL 1 MG TABLET: 1 | 30 days supply | Qty: 30 | Fill #0

## 2016-06-28 MED FILL — PROGESTERONE 100 MG CAPSULE: 100 | 30 days supply | Qty: 30 | Fill #0

## 2016-07-03 ENCOUNTER — Ambulatory Visit (INDEPENDENT_AMBULATORY_CARE_PROVIDER_SITE_OTHER): Payer: BC Managed Care – PPO | Admitting: Cardiology

## 2016-07-03 VITALS — BP 114/78 | HR 84 | Ht 66.5 in | Wt 152.8 lb

## 2016-07-03 DIAGNOSIS — E785 Hyperlipidemia, unspecified: Secondary | ICD-10-CM | POA: Diagnosis not present

## 2016-07-03 DIAGNOSIS — F4321 Adjustment disorder with depressed mood: Secondary | ICD-10-CM | POA: Diagnosis not present

## 2016-07-03 DIAGNOSIS — Z8249 Family history of ischemic heart disease and other diseases of the circulatory system: Secondary | ICD-10-CM | POA: Diagnosis not present

## 2016-07-03 DIAGNOSIS — I493 Ventricular premature depolarization: Secondary | ICD-10-CM | POA: Diagnosis not present

## 2016-07-03 MED FILL — BUPROPION HCL XL 150 MG TAB: 150 | 90 days supply | Qty: 270 | Fill #0

## 2016-07-03 MED FILL — DULoxetine HCL 30 MG CPEP: 30 | 30 days supply | Qty: 30 | Fill #0

## 2016-07-03 NOTE — Progress Notes (Signed)
Cardiology Office Note    Date:  07/03/2016   ID:  Megan Combs, DOB 06/04/1961, MRN [id number]  PCP:  Kelton Pillar, MD  Cardiologist:  Fransico Him, MD   Chief Complaint  Patient presents with  . Follow-up    PVCs, dyslipidemia, family history of CAD    History of Present Illness:  Megan Combs is a 55 y.o. female with a history of dyslipidemia, family history of CAD and palpitations with heart monitor showing PVC's.She had recent chest pain and had a nuclear stress that showed no ischemia.   She now presents today for followup. She is doing well. She denies any chest pain, SOB, DOE, LE edema, palpitations, dizziness or syncope.     Past Medical History:  Diagnosis Date  . Allergy   . Anemia   . Anxiety   . Arthritis    right knee  . Degenerative disc disease   . Depression   . Family history of early CAD   . Fibromyalgia   . Fibromyalgia   . Hyperlipidemia   . IBS (irritable bowel syndrome)   . Lumbar radiculitis   . Plantar fasciitis of left foot   . PVC (premature ventricular contraction)   . Salmonella poisoning     Past Surgical History:  Procedure Laterality Date  . COLONOSCOPY    . KNEE ARTHROSCOPY    . KNEE ARTHROSCOPY    . laproscopy    . TONSILLECTOMY      Current Medications: Outpatient Medications Prior to Visit  Medication Sig Dispense Refill  . acetic acid-hydrocortisone (VOSOL-HC) otic solution Place 4 drops into both ears daily. Use for one week and then off 3 weeks 10 mL 5  . aspirin 81 MG tablet Take 81 mg by mouth daily.    Marland Kitchen b complex vitamins tablet Take 1 tablet by mouth daily.    Marland Kitchen buPROPion (WELLBUTRIN XL) 150 MG 24 hr tablet Take 150 mg by mouth daily.    Marland Kitchen buPROPion (WELLBUTRIN XL) 300 MG 24 hr tablet Take 300 mg by mouth daily.    . Calcium Carbonate-Vitamin D (CALCIUM 500 + D) 500-125 MG-UNIT TABS Take 1 tablet by mouth daily.    . diclofenac (VOLTAREN) 75 MG EC tablet Take 75 mg by mouth 2 (two) times daily as  needed for mild pain or moderate pain.    Marland Kitchen estradiol (ESTRACE) 1 MG tablet TAKE 1 TABLET (1 MG TOTAL) BY MOUTH DAILY. 90 tablet 0  . ferrous sulfate 325 (65 FE) MG tablet Take 325 mg by mouth 3 (three) times a week.     . fish oil-omega-3 fatty acids 1000 MG capsule Take 1 g by mouth daily.    Marland Kitchen glucosamine-chondroitin 500-400 MG tablet Take 3 tablets by mouth daily.    . Multiple Vitamin (MULITIVITAMIN WITH MINERALS) TABS Take 1 tablet by mouth daily.    . nitroGLYCERIN (NITROSTAT) 0.4 MG SL tablet Place 1 tablet (0.4 mg total) under the tongue every 5 (five) minutes as needed for chest pain. 25 tablet 3  . Probiotic Product (PROBIOTIC DAILY PO) Take 1 Can by mouth daily.    . progesterone (PROMETRIUM) 100 MG capsule Take 1 capsule (100 mg total) by mouth daily. 90 capsule 1   No facility-administered medications prior to visit.      Allergies:   Colace  [docusate calcium]; Morphine and related; Other; Penicillins; Zithromax [azithromycin]; Dulcolax [bisacodyl]; Effexor [venlafaxine]; Erythromycin; Prednisone; and Tape   Social History   Social History  .  Marital status: Married    Spouse name: N/A  . Number of children: 0  . Years of education: N/A   Occupational History  . RN [employer]  .  [employer]   Social History Main Topics  . Smoking status: Never Smoker  . Smokeless tobacco: Never Used  . Alcohol use No  . Drug use: No  . Sexual activity: Yes    Birth control/ protection: Post-menopausal   Other Topics Concern  . Not on file   Social History Narrative  . No narrative on file     Family History:  The patient's family history includes Arthritis in her mother; Cancer in her father and maternal grandmother; Colon cancer in her father; Diabetes in her father, maternal grandfather, and paternal grandfather; Esophageal cancer in her father; Heart attack in her mother; Heart disease in her father, maternal grandfather, mother, and paternal grandfather;  Hyperlipidemia in her brother and father; Hypertension in her brother and father; Mental illness in her maternal grandfather and mother; Prostate cancer in her father; Stomach cancer in her father; Stroke in her father and maternal grandfather.   ROS:   Please see the history of present illness.    ROS All other systems reviewed and are negative.   PHYSICAL EXAM:   VS:  BP 114/78   Pulse 84   Ht 5' 6.5" (1.689 m)   Wt 152 lb 12.8 oz (69.3 kg)   LMP 10/28/2008   BMI 24.29 kg/m    GEN: Well nourished, well developed, in no acute distress  HEENT: normal  Neck: no JVD, carotid bruits, or masses Cardiac: RRR; no murmurs, rubs, or gallops,no edema.  Intact distal pulses bilaterally.  Respiratory:  clear to auscultation bilaterally, normal work of breathing GI: soft, nontender, nondistended, + BS MS: no deformity or atrophy  Skin: warm and dry, no rash Neuro:  Alert and Oriented x 3, Strength and sensation are intact Psych: euthymic mood, full affect  Wt Readings from Last 3 Encounters:  07/03/16 152 lb 12.8 oz (69.3 kg)  04/25/16 154 lb (69.9 kg)  03/27/16 155 lb (70.3 kg)      Studies/Labs Reviewed:   EKG:  EKG is not ordered today.    Recent Labs: 07/05/2015: Hemoglobin 13.3   Lipid Panel    Component Value Date/Time   CHOL 185 07/11/2014 0939   TRIG 73.0 07/11/2014 0939   HDL 71.90 07/11/2014 0939   CHOLHDL 3 07/11/2014 0939   VLDL 14.6 07/11/2014 0939   LDLCALC 99 07/11/2014 0939    Additional studies/ records that were reviewed today include:  none    ASSESSMENT:    1. PVC (premature ventricular contraction)   2. Hyperlipidemia   3. Family history of early CAD      PLAN:  In order of problems listed above:  1. PVCs - asymptomatic. 2. Hyperlipidemia - continue fish oil. 3. Family history of CAD with recent CP with no ischemia on recent nuclear stress test.    Medication Adjustments/Labs and Tests Ordered: Current medicines are reviewed at length  with the patient today.  Concerns regarding medicines are outlined above.  Medication changes, Labs and Tests ordered today are listed in the Patient Instructions below.  There are no Patient Instructions on file for this visit.   Signed, Fransico Him, MD  07/03/2016 8:35 AM    [facility] [address], [place], [place]  [address] Phone: [phone]; Fax: [phone]

## 2016-07-03 NOTE — Patient Instructions (Signed)
Medication Instructions:  Your physician recommends that you continue on your current medications as directed. Please refer to the Current Medication list given to you today.   Labwork: Your physician recommends that you return for FASTING lab work.    Testing/Procedures: None ordered.  Follow-Up: Your physician wants you to follow-up in: 1 year with Dr. Radford Pax. You will receive a reminder letter in the mail two months in advance. If you don't receive a letter, please call our office to schedule the follow-up appointment.   Any Other Special Instructions Will Be Listed Below (If Applicable).     If you need a refill on your cardiac medications before your next appointment, please call your pharmacy.

## 2016-07-05 ENCOUNTER — Other Ambulatory Visit: Payer: BC Managed Care – PPO

## 2016-07-23 DIAGNOSIS — H35371 Puckering of macula, right eye: Secondary | ICD-10-CM | POA: Diagnosis not present

## 2016-07-23 DIAGNOSIS — H40042 Steroid responder, left eye: Secondary | ICD-10-CM | POA: Diagnosis not present

## 2016-07-23 DIAGNOSIS — H04122 Dry eye syndrome of left lacrimal gland: Secondary | ICD-10-CM | POA: Diagnosis not present

## 2016-07-23 DIAGNOSIS — H20042 Secondary noninfectious iridocyclitis, left eye: Secondary | ICD-10-CM | POA: Diagnosis not present

## 2016-07-24 ENCOUNTER — Other Ambulatory Visit: Payer: Self-pay | Admitting: Internal Medicine

## 2016-07-24 DIAGNOSIS — Z1231 Encounter for screening mammogram for malignant neoplasm of breast: Secondary | ICD-10-CM

## 2016-07-26 ENCOUNTER — Other Ambulatory Visit: Payer: BC Managed Care – PPO | Admitting: *Deleted

## 2016-07-26 DIAGNOSIS — Z8249 Family history of ischemic heart disease and other diseases of the circulatory system: Secondary | ICD-10-CM | POA: Diagnosis not present

## 2016-07-26 DIAGNOSIS — E785 Hyperlipidemia, unspecified: Secondary | ICD-10-CM | POA: Diagnosis not present

## 2016-07-26 LAB — LIPID PANEL
CHOL/HDL RATIO: 2.3 ratio (ref <=5.0)
Cholesterol: 197 mg/dL (ref 125–200)
HDL: 86 mg/dL (ref >=46)
LDL CALC: 101 mg/dL (ref <130)
TRIGLYCERIDES: 51 mg/dL (ref <150)
VLDL: 10 mg/dL (ref <30)

## 2016-07-29 ENCOUNTER — Other Ambulatory Visit: Payer: Self-pay | Admitting: Internal Medicine

## 2016-07-29 ENCOUNTER — Ambulatory Visit
Admission: RE | Admit: 2016-07-29 | Discharge: 2016-07-29 | Disposition: A | Discharge status: Home / Self Care | Payer: BC Managed Care – PPO | Source: Ambulatory Visit | Attending: Internal Medicine | Admitting: Internal Medicine

## 2016-07-29 DIAGNOSIS — Z1231 Encounter for screening mammogram for malignant neoplasm of breast: Secondary | ICD-10-CM

## 2016-08-01 MED FILL — PROGESTERONE 100 MG CAPSULE: 100 | 90 days supply | Qty: 90 | Fill #0

## 2016-08-01 MED FILL — ESTRADIOL 1 MG TABLET: 1 | 90 days supply | Qty: 90 | Fill #0

## 2016-08-16 DIAGNOSIS — F331 Major depressive disorder, recurrent, moderate: Secondary | ICD-10-CM | POA: Diagnosis not present

## 2016-08-16 DIAGNOSIS — F4310 Post-traumatic stress disorder, unspecified: Secondary | ICD-10-CM | POA: Diagnosis not present

## 2016-08-23 DIAGNOSIS — F331 Major depressive disorder, recurrent, moderate: Secondary | ICD-10-CM | POA: Diagnosis not present

## 2016-08-23 DIAGNOSIS — F4310 Post-traumatic stress disorder, unspecified: Secondary | ICD-10-CM | POA: Diagnosis not present

## 2016-08-27 DIAGNOSIS — H2513 Age-related nuclear cataract, bilateral: Secondary | ICD-10-CM | POA: Diagnosis not present

## 2016-08-28 DIAGNOSIS — F4321 Adjustment disorder with depressed mood: Secondary | ICD-10-CM | POA: Diagnosis not present

## 2016-08-28 DIAGNOSIS — F329 Major depressive disorder, single episode, unspecified: Secondary | ICD-10-CM | POA: Diagnosis not present

## 2016-08-28 DIAGNOSIS — N951 Menopausal and female climacteric states: Secondary | ICD-10-CM | POA: Diagnosis not present

## 2016-08-29 DIAGNOSIS — F331 Major depressive disorder, recurrent, moderate: Secondary | ICD-10-CM | POA: Diagnosis not present

## 2016-08-29 DIAGNOSIS — F4310 Post-traumatic stress disorder, unspecified: Secondary | ICD-10-CM | POA: Diagnosis not present

## 2016-09-06 DIAGNOSIS — F331 Major depressive disorder, recurrent, moderate: Secondary | ICD-10-CM | POA: Diagnosis not present

## 2016-09-06 DIAGNOSIS — F4310 Post-traumatic stress disorder, unspecified: Secondary | ICD-10-CM | POA: Diagnosis not present

## 2016-09-08 DIAGNOSIS — R05 Cough: Secondary | ICD-10-CM | POA: Diagnosis not present

## 2016-09-08 DIAGNOSIS — J0110 Acute frontal sinusitis, unspecified: Secondary | ICD-10-CM | POA: Diagnosis not present

## 2016-09-12 DIAGNOSIS — F4310 Post-traumatic stress disorder, unspecified: Secondary | ICD-10-CM | POA: Diagnosis not present

## 2016-09-12 DIAGNOSIS — F331 Major depressive disorder, recurrent, moderate: Secondary | ICD-10-CM | POA: Diagnosis not present

## 2016-09-17 DIAGNOSIS — H2513 Age-related nuclear cataract, bilateral: Secondary | ICD-10-CM | POA: Diagnosis not present

## 2016-09-27 DIAGNOSIS — F4310 Post-traumatic stress disorder, unspecified: Secondary | ICD-10-CM | POA: Diagnosis not present

## 2016-09-27 DIAGNOSIS — F331 Major depressive disorder, recurrent, moderate: Secondary | ICD-10-CM | POA: Diagnosis not present

## 2016-10-11 DIAGNOSIS — F4310 Post-traumatic stress disorder, unspecified: Secondary | ICD-10-CM | POA: Diagnosis not present

## 2016-10-11 DIAGNOSIS — F331 Major depressive disorder, recurrent, moderate: Secondary | ICD-10-CM | POA: Diagnosis not present

## 2016-10-17 DIAGNOSIS — F4310 Post-traumatic stress disorder, unspecified: Secondary | ICD-10-CM | POA: Diagnosis not present

## 2016-10-17 DIAGNOSIS — F331 Major depressive disorder, recurrent, moderate: Secondary | ICD-10-CM | POA: Diagnosis not present

## 2016-10-29 DIAGNOSIS — F4321 Adjustment disorder with depressed mood: Secondary | ICD-10-CM | POA: Diagnosis not present

## 2016-10-29 DIAGNOSIS — F4310 Post-traumatic stress disorder, unspecified: Secondary | ICD-10-CM | POA: Diagnosis not present

## 2016-10-29 DIAGNOSIS — F331 Major depressive disorder, recurrent, moderate: Secondary | ICD-10-CM | POA: Diagnosis not present

## 2016-10-30 MED FILL — PROGESTERONE 100 MG CAPSULE: 100 | 90 days supply | Qty: 90 | Fill #1

## 2016-10-30 MED FILL — ESTRADIOL 1 MG TABLET: 1 | 90 days supply | Qty: 90 | Fill #1

## 2016-11-01 DIAGNOSIS — Z111 Encounter for screening for respiratory tuberculosis: Secondary | ICD-10-CM | POA: Diagnosis not present

## 2016-11-05 DIAGNOSIS — F331 Major depressive disorder, recurrent, moderate: Secondary | ICD-10-CM | POA: Diagnosis not present

## 2016-11-05 DIAGNOSIS — F4310 Post-traumatic stress disorder, unspecified: Secondary | ICD-10-CM | POA: Diagnosis not present

## 2016-11-12 DIAGNOSIS — F4310 Post-traumatic stress disorder, unspecified: Secondary | ICD-10-CM | POA: Diagnosis not present

## 2016-11-12 DIAGNOSIS — F331 Major depressive disorder, recurrent, moderate: Secondary | ICD-10-CM | POA: Diagnosis not present

## 2016-11-19 DIAGNOSIS — H2513 Age-related nuclear cataract, bilateral: Secondary | ICD-10-CM | POA: Diagnosis not present

## 2016-11-19 DIAGNOSIS — Z0100 Encounter for examination of eyes and vision without abnormal findings: Secondary | ICD-10-CM | POA: Diagnosis not present

## 2016-11-26 DIAGNOSIS — F331 Major depressive disorder, recurrent, moderate: Secondary | ICD-10-CM | POA: Diagnosis not present

## 2016-11-26 DIAGNOSIS — F4310 Post-traumatic stress disorder, unspecified: Secondary | ICD-10-CM | POA: Diagnosis not present

## 2016-12-03 MED FILL — BUPROPION HCL XL 150 MG TAB: 150 | 90 days supply | Qty: 270 | Fill #1

## 2016-12-10 DIAGNOSIS — F4310 Post-traumatic stress disorder, unspecified: Secondary | ICD-10-CM | POA: Diagnosis not present

## 2016-12-10 DIAGNOSIS — F331 Major depressive disorder, recurrent, moderate: Secondary | ICD-10-CM | POA: Diagnosis not present

## 2016-12-17 DIAGNOSIS — R300 Dysuria: Secondary | ICD-10-CM | POA: Diagnosis not present

## 2016-12-17 DIAGNOSIS — R3915 Urgency of urination: Secondary | ICD-10-CM | POA: Diagnosis not present

## 2016-12-20 DIAGNOSIS — R300 Dysuria: Secondary | ICD-10-CM | POA: Diagnosis not present

## 2016-12-25 DIAGNOSIS — F439 Reaction to severe stress, unspecified: Secondary | ICD-10-CM | POA: Diagnosis not present

## 2016-12-25 DIAGNOSIS — R3915 Urgency of urination: Secondary | ICD-10-CM | POA: Diagnosis not present

## 2016-12-30 MED FILL — MOXIFLOXACIN 0.5% EYE DROPS: 0.5 | 15 days supply | Qty: 3 | Fill #0

## 2016-12-30 MED FILL — KETOROLAC 0.4% OPHTH SOLN: 0.4 | 25 days supply | Qty: 5 | Fill #0

## 2016-12-30 MED FILL — PREDNISOLONE AC 1% EYE DROP: 1 | 90 days supply | Qty: 10 | Fill #0

## 2016-12-30 MED FILL — NEO/POLY/DEXAMET EYE OINT: 3.5-10000-0 | 14 days supply | Qty: 4 | Fill #0

## 2016-12-31 DIAGNOSIS — F4310 Post-traumatic stress disorder, unspecified: Secondary | ICD-10-CM | POA: Diagnosis not present

## 2016-12-31 DIAGNOSIS — F331 Major depressive disorder, recurrent, moderate: Secondary | ICD-10-CM | POA: Diagnosis not present

## 2017-01-01 DIAGNOSIS — H25812 Combined forms of age-related cataract, left eye: Secondary | ICD-10-CM | POA: Diagnosis not present

## 2017-01-01 DIAGNOSIS — H2512 Age-related nuclear cataract, left eye: Secondary | ICD-10-CM | POA: Diagnosis not present

## 2017-01-14 MED FILL — FLUOROMETHOLONE 0.1% DROPS: 0.1 | 25 days supply | Qty: 5 | Fill #0

## 2017-01-27 DIAGNOSIS — F4321 Adjustment disorder with depressed mood: Secondary | ICD-10-CM | POA: Diagnosis not present

## 2017-02-04 ENCOUNTER — Emergency Department (HOSPITAL_COMMUNITY): Payer: BC Managed Care – PPO

## 2017-02-04 ENCOUNTER — Encounter (HOSPITAL_COMMUNITY): Payer: Self-pay | Admitting: Emergency Medicine

## 2017-02-04 ENCOUNTER — Emergency Department (HOSPITAL_COMMUNITY)
Admission: EM | Admit: 2017-02-04 | Discharge: 2017-02-04 | Disposition: A | Discharge status: Home / Self Care | Payer: BC Managed Care – PPO | Attending: Emergency Medicine | Admitting: Emergency Medicine

## 2017-02-04 DIAGNOSIS — M25552 Pain in left hip: Secondary | ICD-10-CM | POA: Diagnosis not present

## 2017-02-04 DIAGNOSIS — Y92009 Unspecified place in unspecified non-institutional (private) residence as the place of occurrence of the external cause: Secondary | ICD-10-CM | POA: Insufficient documentation

## 2017-02-04 DIAGNOSIS — Z7982 Long term (current) use of aspirin: Secondary | ICD-10-CM | POA: Insufficient documentation

## 2017-02-04 DIAGNOSIS — W19XXXA Unspecified fall, initial encounter: Secondary | ICD-10-CM

## 2017-02-04 DIAGNOSIS — Y9301 Activity, walking, marching and hiking: Secondary | ICD-10-CM | POA: Insufficient documentation

## 2017-02-04 DIAGNOSIS — Y999 Unspecified external cause status: Secondary | ICD-10-CM | POA: Diagnosis not present

## 2017-02-04 DIAGNOSIS — Z79899 Other long term (current) drug therapy: Secondary | ICD-10-CM | POA: Diagnosis not present

## 2017-02-04 DIAGNOSIS — W01198A Fall on same level from slipping, tripping and stumbling with subsequent striking against other object, initial encounter: Secondary | ICD-10-CM | POA: Insufficient documentation

## 2017-02-04 DIAGNOSIS — Z043 Encounter for examination and observation following other accident: Secondary | ICD-10-CM | POA: Diagnosis not present

## 2017-02-04 DIAGNOSIS — S79912A Unspecified injury of left hip, initial encounter: Secondary | ICD-10-CM | POA: Diagnosis not present

## 2017-02-04 NOTE — Discharge Instructions (Signed)
Rest as much as needed to improve the left hip discomfort.  Use ice on the sore areas 3 or 4 times a day.  For pain, use ibuprofen 400 mg 3 times a day with meals.  Return here if needed for problems or other concerns.

## 2017-02-04 NOTE — ED Triage Notes (Signed)
Patient states that her foot got caught on doggie gate while stepping over it. patient fell on left side. Patient c/o left hip pain.  Patient applied ice, and took Tylenol and Ibuprofen without any relief.

## 2017-02-04 NOTE — ED Provider Notes (Signed)
[facility] DEPT Provider Note   CSN: [id number] Arrival date & time: 02/04/17  0815     History   Chief Complaint Chief Complaint  Patient presents with  . Fall  . Hip Pain    HPI Megan Combs is a 56 y.o. female.  She presents for evaluation of left hip pain, onset after fall.  She was walking at home, stepping over a dog gate, when she fell striking her left hip on a carpeted stairway.  She could not get up without assistance but ultimately was able to stand and walk, with the help of her husband.  She feels like she bumped her head but denies persistent headache.  She denies neck pain back pain paresthesia or focal weakness.  She has ongoing problems with her eyes and yesterday saw her ophthalmologist, and was dilated.  She believes that her vision problems may be contributing to her falling.  She denies change in her vision, since the fall.  No recent illnesses.  There are no other known modifying factors.  HPI  Past Medical History:  Diagnosis Date  . Allergy   . Anemia   . Anxiety   . Arthritis    right knee  . Degenerative disc disease   . Depression   . Family history of early CAD   . Fibromyalgia   . Fibromyalgia   . Hyperlipidemia   . IBS (irritable bowel syndrome)   . Lumbar radiculitis   . Plantar fasciitis of left foot   . PVC (premature ventricular contraction)   . Salmonella poisoning     Patient Active Problem List   Diagnosis Date Noted  . Medication intolerance 07/04/2014  . Family history of early CAD 06/29/2014  . Palpitations 06/29/2014  . Hyperlipidemia   . PVC (premature ventricular contraction)   . Low back pain 06/20/2014  . Right hip pain 02/15/2014  . Right shoulder injury 02/01/2014  . Salmonella 10/27/2013  . Breast density 03/17/2013  . Renal cyst, left 01/06/2013  . Family history of colon cancer 01/03/2013  . Elevated alkaline phosphatase measurement 01/03/2013  . Articular cartilage disorder of knee 12/24/2012  .  Plantar fasciitis 07/21/2012  . Anxiety 07/02/2012  . History of anemia 07/01/2012  . Depression 07/01/2012  . Menopausal hot flushes 07/01/2012  . DJD (degenerative joint disease) 07/01/2012  . Vaginal dryness, menopausal 07/01/2012  . Urge incontinence of urine 07/01/2012  . Fibromyalgia   . Family history of malignant neoplasm 12/25/2011  . Personal history of colonic polyps 12/25/2011    Past Surgical History:  Procedure Laterality Date  . COLONOSCOPY    . EYE SURGERY    . KNEE ARTHROSCOPY    . KNEE ARTHROSCOPY    . laproscopy    . TONSILLECTOMY      OB History    Gravida Para Term Preterm AB Living   2       1 0   SAB TAB Ectopic Multiple Live Births       1           Home Medications    Prior to Admission medications   Medication Sig Start Date End Date Taking? Authorizing Provider  acetic acid-hydrocortisone (VOSOL-HC) otic solution Place 4 drops into both ears daily. Use for one week and then off 3 weeks 07/04/15   Tereasa Coop, PA-C  aspirin 81 MG tablet Take 81 mg by mouth daily.    Historical Provider, MD  b complex vitamins tablet Take 1 tablet by  mouth daily.    Historical Provider, MD  buPROPion (WELLBUTRIN XL) 150 MG 24 hr tablet Take 150 mg by mouth daily.    Historical Provider, MD  buPROPion (WELLBUTRIN XL) 300 MG 24 hr tablet Take 300 mg by mouth daily.    Historical Provider, MD  Calcium Carbonate-Vitamin D (CALCIUM 500 + D) 500-125 MG-UNIT TABS Take 1 tablet by mouth daily.    Historical Provider, MD  diclofenac (VOLTAREN) 75 MG EC tablet Take 75 mg by mouth 2 (two) times daily as needed for mild pain or moderate pain.    Historical Provider, MD  estradiol (ESTRACE) 1 MG tablet TAKE 1 TABLET (1 MG TOTAL) BY MOUTH DAILY. 10/19/15   Elby Beck, FNP  ferrous sulfate 325 (65 FE) MG tablet Take 325 mg by mouth 3 (three) times a week.     Historical Provider, MD  fish oil-omega-3 fatty acids 1000 MG capsule Take 1 g by mouth daily.    Historical  Provider, MD  glucosamine-chondroitin 500-400 MG tablet Take 3 tablets by mouth daily.    Historical Provider, MD  Multiple Vitamin (MULITIVITAMIN WITH MINERALS) TABS Take 1 tablet by mouth daily.    Historical Provider, MD  nitroGLYCERIN (NITROSTAT) 0.4 MG SL tablet Place 1 tablet (0.4 mg total) under the tongue every 5 (five) minutes as needed for chest pain. 03/27/16   Bhavinkumar Bhagat, PA  Probiotic Product (PROBIOTIC DAILY PO) Take 1 Can by mouth daily.    Historical Provider, MD  progesterone (PROMETRIUM) 100 MG capsule Take 1 capsule (100 mg total) by mouth daily. 03/28/15   Elby Beck, FNP    Family History Family History  Problem Relation Age of Onset  . Colon cancer Father   . Esophageal cancer Father   . Cancer Father   . Stroke Father   . Heart disease Father   . Hypertension Father   . Hyperlipidemia Father   . Diabetes Father   . Prostate cancer Father   . Stomach cancer Father   . Heart disease Mother   . Arthritis Mother   . Heart attack Mother   . Mental illness Mother   . Diabetes Maternal Grandfather   . Heart disease Maternal Grandfather   . Mental illness Maternal Grandfather   . Stroke Maternal Grandfather   . Hypertension Brother   . Hyperlipidemia Brother   . Cancer Maternal Grandmother     circulatory cancer  . Heart disease Paternal Grandfather   . Diabetes Paternal Grandfather   . Pancreatic cancer Neg Hx   . Rectal cancer Neg Hx     Social History Social History  Substance Use Topics  . Smoking status: Never Smoker  . Smokeless tobacco: Never Used  . Alcohol use No     Allergies   Colace  [docusate calcium]; Morphine and related; Other; Penicillins; Zithromax [azithromycin]; Dulcolax [bisacodyl]; Effexor [venlafaxine]; Erythromycin; Prednisone; and Tape   Review of Systems Review of Systems  All other systems reviewed and are negative.    Physical Exam Updated Vital Signs BP 116/74 (BP Location: Left Arm)   Pulse (!) 59    Temp 98.2 F (36.8 C) (Oral)   Resp 16   Ht 5\' 6"  (1.676 m)   Wt 160 lb (72.6 kg)   LMP 10/28/2008   SpO2 98%   BMI 25.82 kg/m   Physical Exam  Constitutional: She is oriented to person, place, and time. She appears well-developed and well-nourished.  HENT:  Head: Normocephalic and atraumatic.  Eyes:  Conjunctivae and EOM are normal. Pupils are equal, round, and reactive to light.  Neck: Normal range of motion and phonation normal. Neck supple.  Cardiovascular: Normal rate and regular rhythm.   Pulmonary/Chest: Effort normal and breath sounds normal. She exhibits no tenderness.  Musculoskeletal: Normal range of motion.  Mildly tender left hip, to palpation, with somewhat limited range of motion but nearly full movement, actively.  Neurological: She is alert and oriented to person, place, and time. She exhibits normal muscle tone.  Skin: Skin is warm and dry.  Psychiatric: She has a normal mood and affect. Her behavior is normal. Judgment and thought content normal.  Nursing note and vitals reviewed.    ED Treatments / Results  Labs (all labs ordered are listed, but only abnormal results are displayed) Labs Reviewed - No data to display  EKG  EKG Interpretation None       Radiology Dg Hip Unilat With Pelvis 2-3 Views Left  Result Date: 02/04/2017 CLINICAL DATA:  Left hip pain after fall at home today. EXAM: DG HIP (WITH OR WITHOUT PELVIS) 2-3V LEFT COMPARISON:  None. FINDINGS: There is no evidence of hip fracture or dislocation. There is no evidence of arthropathy or other focal bone abnormality. IMPRESSION: Normal left hip. Electronically Signed   By: Marijo Conception, M.D.   On: 02/04/2017 09:04    Procedures Procedures (including critical care time)  Medications Ordered in ED Medications - No data to display   Initial Impression / Assessment and Plan / ED Course  I have reviewed the triage vital signs and the nursing notes.  Pertinent labs & imaging results  that were available during my care of the patient were reviewed by me and considered in my medical decision making (see chart for details).    Medications - No data to display  Patient Vitals for the past 24 hrs:  BP Temp Temp src Pulse Resp SpO2 Height Weight  02/04/17 0832 - - - - - - 5\' 6"  (1.676 m) 160 lb (72.6 kg)  02/04/17 0824 116/74 98.2 F (36.8 C) Oral (!) 59 16 98 % - -    9:28 AM Reevaluation with update and discussion. After initial assessment and treatment, an updated evaluation reveals patient remains fairly comfortable.  Findings discussed with patient and husband, all questions answered. Ashvin Adelson L    Final Clinical Impressions(s) / ED Diagnoses   Final diagnoses:  Left hip pain  Fall in home, initial encounter    Fall with contusions, no suspected fracture, intracranial trauma, spine injury, or visceral injury.  Nursing Notes Reviewed/ Care Coordinated Applicable Imaging Reviewed Interpretation of Laboratory Data incorporated into ED treatment  The patient appears reasonably screened and/or stabilized for discharge and I doubt any other medical condition or other [facility] requiring further screening, evaluation, or treatment in the ED at this time prior to discharge.  Plan: Home Medications-ibuprofen for pain; Home Treatments-rest, cryotherapy; return here if the recommended treatment, does not improve the symptoms; Recommended follow up-PCP, as needed   New Prescriptions New Prescriptions   No medications on file     Daleen Bo, MD 02/04/17 [phone]

## 2017-02-05 MED FILL — ESTRADIOL 1 MG TABLET: 1 | 90 days supply | Qty: 90 | Fill #2

## 2017-02-05 MED FILL — PROGESTERONE 100 MG CAPSULE: 100 | 90 days supply | Qty: 90 | Fill #2

## 2017-02-06 MED FILL — KETOROLAC 0.4% OPHTH SOLN: 0.4 | 30 days supply | Qty: 5 | Fill #0

## 2017-02-17 MED FILL — TIMOLOL 0.5% EYE DROPS: 0.5 | 50 days supply | Qty: 5 | Fill #0

## 2017-02-20 MED FILL — FLUOROMETHOLONE 0.1% DROPS: 0.1 | 30 days supply | Qty: 5 | Fill #0

## 2017-02-28 DIAGNOSIS — H35371 Puckering of macula, right eye: Secondary | ICD-10-CM | POA: Diagnosis not present

## 2017-02-28 DIAGNOSIS — H35363 Drusen (degenerative) of macula, bilateral: Secondary | ICD-10-CM | POA: Diagnosis not present

## 2017-02-28 DIAGNOSIS — H43811 Vitreous degeneration, right eye: Secondary | ICD-10-CM | POA: Diagnosis not present

## 2017-02-28 DIAGNOSIS — H35433 Paving stone degeneration of retina, bilateral: Secondary | ICD-10-CM | POA: Diagnosis not present

## 2017-03-05 DIAGNOSIS — F339 Major depressive disorder, recurrent, unspecified: Secondary | ICD-10-CM | POA: Diagnosis not present

## 2017-03-06 DIAGNOSIS — F331 Major depressive disorder, recurrent, moderate: Secondary | ICD-10-CM | POA: Diagnosis not present

## 2017-03-06 DIAGNOSIS — F4310 Post-traumatic stress disorder, unspecified: Secondary | ICD-10-CM | POA: Diagnosis not present

## 2017-03-10 DIAGNOSIS — F4321 Adjustment disorder with depressed mood: Secondary | ICD-10-CM | POA: Diagnosis not present

## 2017-03-11 MED FILL — ILEVRO 0.3% OPHTH DROPS: 0.3 | 30 days supply | Qty: 3 | Fill #0

## 2017-03-12 MED FILL — BUPROPION XL 150 MG TAB: 150 | 90 days supply | Qty: 270 | Fill #0

## 2017-03-17 DIAGNOSIS — F4310 Post-traumatic stress disorder, unspecified: Secondary | ICD-10-CM | POA: Diagnosis not present

## 2017-03-17 DIAGNOSIS — F331 Major depressive disorder, recurrent, moderate: Secondary | ICD-10-CM | POA: Diagnosis not present

## 2017-03-26 DIAGNOSIS — K635 Polyp of colon: Secondary | ICD-10-CM | POA: Diagnosis not present

## 2017-03-26 DIAGNOSIS — Z23 Encounter for immunization: Secondary | ICD-10-CM | POA: Diagnosis not present

## 2017-03-26 DIAGNOSIS — Z0000 Encounter for general adult medical examination without abnormal findings: Secondary | ICD-10-CM | POA: Diagnosis not present

## 2017-03-26 DIAGNOSIS — N941 Unspecified dyspareunia: Secondary | ICD-10-CM | POA: Diagnosis not present

## 2017-03-31 DIAGNOSIS — F4310 Post-traumatic stress disorder, unspecified: Secondary | ICD-10-CM | POA: Diagnosis not present

## 2017-03-31 DIAGNOSIS — E559 Vitamin D deficiency, unspecified: Secondary | ICD-10-CM | POA: Diagnosis not present

## 2017-03-31 DIAGNOSIS — Z136 Encounter for screening for cardiovascular disorders: Secondary | ICD-10-CM | POA: Diagnosis not present

## 2017-03-31 DIAGNOSIS — F331 Major depressive disorder, recurrent, moderate: Secondary | ICD-10-CM | POA: Diagnosis not present

## 2017-03-31 DIAGNOSIS — Z0000 Encounter for general adult medical examination without abnormal findings: Secondary | ICD-10-CM | POA: Diagnosis not present

## 2017-04-08 DIAGNOSIS — N951 Menopausal and female climacteric states: Secondary | ICD-10-CM | POA: Diagnosis not present

## 2017-05-07 MED FILL — ESTRADIOL 1 MG TABLET: 1 | 90 days supply | Qty: 90 | Fill #0

## 2017-05-07 MED FILL — PROGESTERONE 100 MG CAPSULE: 100 | 90 days supply | Qty: 90 | Fill #0

## 2017-06-06 DIAGNOSIS — F4310 Post-traumatic stress disorder, unspecified: Secondary | ICD-10-CM | POA: Diagnosis not present

## 2017-06-06 DIAGNOSIS — F331 Major depressive disorder, recurrent, moderate: Secondary | ICD-10-CM | POA: Diagnosis not present

## 2017-06-11 DIAGNOSIS — F4310 Post-traumatic stress disorder, unspecified: Secondary | ICD-10-CM | POA: Diagnosis not present

## 2017-06-11 DIAGNOSIS — F331 Major depressive disorder, recurrent, moderate: Secondary | ICD-10-CM | POA: Diagnosis not present

## 2017-06-12 DIAGNOSIS — F4321 Adjustment disorder with depressed mood: Secondary | ICD-10-CM | POA: Diagnosis not present

## 2017-06-12 MED FILL — LITHIUM CARBONATE ER 300 MG: 300 | 30 days supply | Qty: 30 | Fill #0

## 2017-06-16 DIAGNOSIS — F331 Major depressive disorder, recurrent, moderate: Secondary | ICD-10-CM | POA: Diagnosis not present

## 2017-06-16 DIAGNOSIS — F4310 Post-traumatic stress disorder, unspecified: Secondary | ICD-10-CM | POA: Diagnosis not present

## 2017-06-19 ENCOUNTER — Encounter (HOSPITAL_COMMUNITY): Payer: Self-pay

## 2017-06-19 ENCOUNTER — Emergency Department (HOSPITAL_COMMUNITY): Payer: BC Managed Care – PPO

## 2017-06-19 ENCOUNTER — Emergency Department (HOSPITAL_COMMUNITY)
Admission: EM | Admit: 2017-06-19 | Discharge: 2017-06-19 | Disposition: A | Discharge status: Home / Self Care | Payer: BC Managed Care – PPO | Attending: Emergency Medicine | Admitting: Emergency Medicine

## 2017-06-19 DIAGNOSIS — M179 Osteoarthritis of knee, unspecified: Secondary | ICD-10-CM | POA: Diagnosis not present

## 2017-06-19 DIAGNOSIS — S80211A Abrasion, right knee, initial encounter: Secondary | ICD-10-CM

## 2017-06-19 DIAGNOSIS — M7989 Other specified soft tissue disorders: Secondary | ICD-10-CM | POA: Diagnosis not present

## 2017-06-19 DIAGNOSIS — Z79899 Other long term (current) drug therapy: Secondary | ICD-10-CM | POA: Insufficient documentation

## 2017-06-19 DIAGNOSIS — Y92410 Unspecified street and highway as the place of occurrence of the external cause: Secondary | ICD-10-CM | POA: Diagnosis not present

## 2017-06-19 DIAGNOSIS — Y998 Other external cause status: Secondary | ICD-10-CM | POA: Diagnosis not present

## 2017-06-19 DIAGNOSIS — Z7982 Long term (current) use of aspirin: Secondary | ICD-10-CM | POA: Diagnosis not present

## 2017-06-19 DIAGNOSIS — Y9301 Activity, walking, marching and hiking: Secondary | ICD-10-CM | POA: Insufficient documentation

## 2017-06-19 DIAGNOSIS — W0110XA Fall on same level from slipping, tripping and stumbling with subsequent striking against unspecified object, initial encounter: Secondary | ICD-10-CM | POA: Insufficient documentation

## 2017-06-19 DIAGNOSIS — S99922A Unspecified injury of left foot, initial encounter: Secondary | ICD-10-CM | POA: Diagnosis present

## 2017-06-19 DIAGNOSIS — S8262XA Displaced fracture of lateral malleolus of left fibula, initial encounter for closed fracture: Secondary | ICD-10-CM

## 2017-06-19 DIAGNOSIS — S8991XA Unspecified injury of right lower leg, initial encounter: Secondary | ICD-10-CM | POA: Diagnosis not present

## 2017-06-19 MED ORDER — OXYCODONE-ACETAMINOPHEN 5-325 MG PO TABS
1.0000 | ORAL_TABLET | Freq: Once | ORAL | Status: AC
  Administered 2017-06-19: 1 via ORAL
  Filled 2017-06-19: qty 1

## 2017-06-19 MED ORDER — OXYCODONE-ACETAMINOPHEN 5-325 MG PO TABS: 1.0000 | ORAL_TABLET | Freq: Three times a day (TID) | ORAL | 0 refills | Status: DC | PRN

## 2017-06-19 MED ORDER — SULINDAC 150 MG PO TABS: 150.0000 mg | ORAL_TABLET | Freq: Two times a day (BID) | ORAL | 0 refills | Status: DC

## 2017-06-19 MED FILL — SULINDAC 150 MG TAB: 150 | 10 days supply | Qty: 20 | Fill #0

## 2017-06-19 MED FILL — OXYCODONE-ACETAMINOPHEN 5-3: 5-325 | 3 days supply | Qty: 10 | Fill #0

## 2017-06-19 NOTE — ED Triage Notes (Signed)
Pt states she was walking dogs and placed foot on sidewalk curbing causing her to fall, rolling her left ankle and hitting her right knee on the pavement causing minor abrasions to left knee. Ankle is swollen on observation, pt in no other obvious distress

## 2017-06-19 NOTE — Progress Notes (Signed)
Orthopedic Tech Progress Note Patient Details:  Megan Combs 02/10/1961 [id number]  Ortho Devices Type of Ortho Device: Ace wrap, Crutches, Post (short leg) splint, Stirrup splint Splint Material: Plaster Ortho Device/Splint Location: Well padded plaster posterior and stirrup short leg splint to Lt leg, Crutch instructions given and adjusted. Ortho Device/Splint Interventions: Application, Adjustment   [name] 06/19/2017, 12:33 PM

## 2017-06-19 NOTE — Discharge Instructions (Signed)
Please call Dr. Ericka Pontiff office to schedule a follow-up appointment. Typically x-rays will be repeated in 10-14 days after the injury. May be transitioned to a short leg cast or walking boot after the swelling improves for possibly 4-6 weeks.  Please keep the ankle elevated above the level of the heart when you are home at rest. You may take 150 mg of sulindac with food 2 times per day to help with pain and inflammation or 650 mg of Tylenol every 6 hours. Please do not take more than 4000 mg of Tylenol in 24 hours. You may apply ice for 15-20 minutes up to 3-4 times a day to also help with inflammation.  If you develop new or worsening symptoms, including another fall or injury to the ankle, if the foot or ankle becomes numb or cold and URI unable to feel the pulse in her foot, please return to the emergency department for reevaluation. Opioids when taken with lithium can increase the effects of the medication. If you develop symptoms such as sweating, agitation, tremor, or a fever, please stop taking this medication and come to the emergency department for reevaluation.

## 2017-06-19 NOTE — ED Provider Notes (Signed)
[facility] DEPT Provider Note   CSN: [id number] Arrival date & time: 06/19/17  8786     History   Chief Complaint Chief Complaint  Patient presents with  . Ankle Pain  . Knee Pain    HPI Megan Combs is a 56 y.o. female who presents to the emergency department with a chief complaint of fall. She reports that she was walking her dogs around 9:30 this morning when she tripped on the curb and fell on her left ankle and right knee. She reports that she was unable to get up or bear any weight so she called for help. A neighbor heard her cries and came to her assistance, but when she tried to stand up she became dizzy from the pain and had to lay back down. The dizziness improved, and she was ultimately able to get home with the assistance of 2 of her neighbors. No previous injuries to the left ankle or right knee. She denies left knee, right ankle, numbness, hitting her head, nausea, vomiting, or any other complaints at this time. No treatment prior to arrival.  No history of diabetes or any other immunocompromising conditions.  Daily medications include Lithium. She is established with Dr. Karlton Lemon and sports medicine.  The history is provided by the patient. No language interpreter was used.    Past Medical History:  Diagnosis Date  . Allergy   . Anemia   . Anxiety   . Arthritis    right knee  . Degenerative disc disease   . Depression   . Family history of early CAD   . Fibromyalgia   . Fibromyalgia   . Hyperlipidemia   . IBS (irritable bowel syndrome)   . Lumbar radiculitis   . Plantar fasciitis of left foot   . PVC (premature ventricular contraction)   . Salmonella poisoning     Patient Active Problem List   Diagnosis Date Noted  . Medication intolerance 07/04/2014  . Family history of early CAD 06/29/2014  . Palpitations 06/29/2014  . Hyperlipidemia   . PVC (premature ventricular contraction)   . Low back pain 06/20/2014  . Right hip pain 02/15/2014    . Right shoulder injury 02/01/2014  . Salmonella 10/27/2013  . Breast density 03/17/2013  . Renal cyst, left 01/06/2013  . Family history of colon cancer 01/03/2013  . Elevated alkaline phosphatase measurement 01/03/2013  . Articular cartilage disorder of knee 12/24/2012  . Plantar fasciitis 07/21/2012  . Anxiety 07/02/2012  . History of anemia 07/01/2012  . Depression 07/01/2012  . Menopausal hot flushes 07/01/2012  . DJD (degenerative joint disease) 07/01/2012  . Vaginal dryness, menopausal 07/01/2012  . Urge incontinence of urine 07/01/2012  . Fibromyalgia   . Family history of malignant neoplasm 12/25/2011  . Personal history of colonic polyps 12/25/2011    Past Surgical History:  Procedure Laterality Date  . COLONOSCOPY    . EYE SURGERY    . KNEE ARTHROSCOPY    . KNEE ARTHROSCOPY    . laproscopy    . TONSILLECTOMY      OB History    Gravida Para Term Preterm AB Living   2       1 0   SAB TAB Ectopic Multiple Live Births       1           Home Medications    Prior to Admission medications   Medication Sig Start Date End Date Taking? Authorizing Provider  acetic acid-hydrocortisone (VOSOL-HC) otic solution  Place 4 drops into both ears daily. Use for one week and then off 3 weeks 07/04/15   Tereasa Coop, PA-C  aspirin 81 MG tablet Take 81 mg by mouth daily.    [provider]  b complex vitamins tablet Take 1 tablet by mouth daily.    [provider]  buPROPion (WELLBUTRIN XL) 150 MG 24 hr tablet Take 150 mg by mouth daily.    [provider]  buPROPion (WELLBUTRIN XL) 300 MG 24 hr tablet Take 300 mg by mouth daily.    [provider]  Calcium Carbonate-Vitamin D (CALCIUM 500 + D) 500-125 MG-UNIT TABS Take 1 tablet by mouth daily.    [provider]  diclofenac (VOLTAREN) 75 MG EC tablet Take 75 mg by mouth 2 (two) times daily as needed for mild pain or moderate pain.    [provider]  estradiol (ESTRACE)  1 MG tablet TAKE 1 TABLET (1 MG TOTAL) BY MOUTH DAILY. 10/19/15   Elby Beck, FNP  ferrous sulfate 325 (65 FE) MG tablet Take 325 mg by mouth 3 (three) times a week.     [provider]  fish oil-omega-3 fatty acids 1000 MG capsule Take 1 g by mouth daily.    [provider]  glucosamine-chondroitin 500-400 MG tablet Take 3 tablets by mouth daily.    [provider]  Multiple Vitamin (MULITIVITAMIN WITH MINERALS) TABS Take 1 tablet by mouth daily.    [provider]  nitroGLYCERIN (NITROSTAT) 0.4 MG SL tablet Place 1 tablet (0.4 mg total) under the tongue every 5 (five) minutes as needed for chest pain. 03/27/16   Bhagat, Crista Luria, PA  oxyCODONE-acetaminophen (PERCOCET/ROXICET) 5-325 MG tablet Take 1 tablet by mouth every 8 (eight) hours as needed for severe pain. 06/19/17   McDonald, Mia A, PA-C  Probiotic Product (PROBIOTIC DAILY PO) Take 1 Can by mouth daily.    [provider]  progesterone (PROMETRIUM) 100 MG capsule Take 1 capsule (100 mg total) by mouth daily. 03/28/15   Elby Beck, FNP  sulindac (CLINORIL) 150 MG tablet Take 1 tablet (150 mg total) by mouth 2 (two) times daily. 06/19/17   McDonald, Laymond Purser, PA-C    Family History Family History  Problem Relation Age of Onset  . Colon cancer Father   . Esophageal cancer Father   . Cancer Father   . Stroke Father   . Heart disease Father   . Hypertension Father   . Hyperlipidemia Father   . Diabetes Father   . Prostate cancer Father   . Stomach cancer Father   . Heart disease Mother   . Arthritis Mother   . Heart attack Mother   . Mental illness Mother   . Diabetes Maternal Grandfather   . Heart disease Maternal Grandfather   . Mental illness Maternal Grandfather   . Stroke Maternal Grandfather   . Hypertension Brother   . Hyperlipidemia Brother   . Cancer Maternal Grandmother        circulatory cancer  . Heart disease Paternal Grandfather   . Diabetes Paternal  Grandfather   . Pancreatic cancer Neg Hx   . Rectal cancer Neg Hx     Social History Social History  Substance Use Topics  . Smoking status: Never Smoker  . Smokeless tobacco: Never Used  . Alcohol use No     Allergies   Colace  [docusate calcium]; Morphine and related; Other; Penicillins; Zithromax [azithromycin]; Dulcolax [bisacodyl]; Effexor [venlafaxine]; Erythromycin; Prednisone;  and Tape   Review of Systems Review of Systems  Musculoskeletal: Positive for arthralgias, gait problem, joint swelling and myalgias. Negative for back pain.  Skin: Positive for wound.  Neurological: Positive for dizziness. Negative for headaches.     Physical Exam Updated Vital Signs BP 120/79 (BP Location: Right Arm)   Pulse 77   Temp 99 F (37.2 C) (Oral)   Resp 16   Ht 5\' 6"  (1.676 m)   Wt 72.6 kg (160 lb)   LMP 10/28/2008   SpO2 100%   BMI 25.82 kg/m   Physical Exam  Constitutional: No distress.  HENT:  Head: Normocephalic.  Eyes: Conjunctivae are normal.  Neck: Neck supple.  Cardiovascular: Normal rate and regular rhythm.  Exam reveals no gallop and no friction rub.   No murmur heard. Pulmonary/Chest: Effort normal. No respiratory distress.  Abdominal: Soft. She exhibits no distension.  Musculoskeletal:  Severe tenderness to palpation over the left lateral malleolus; inferior greater than superior. 2+ DP and PT pulses. Sensation is intact throughout. Able to move all digits of the left foot. Decreased range of motion of the left ankle secondary to pain.  Multiple hemostatic abrasions to the skin overlying the patella of the right knee. No effusions. No medial or lateral joint line tenderness. The patella tracks well. No quadriceps or patellar tendon rupture. Full range of motion of the knee with flexion and extension. Negative anterior and posterior drawer test. Negative valgus and varus stress test.  Neurological: She is alert.  Skin: Skin is warm. No rash noted.    Psychiatric: Her behavior is normal.  Nursing note and vitals reviewed.    ED Treatments / Results  Labs (all labs ordered are listed, but only abnormal results are displayed) Labs Reviewed - No data to display  EKG  EKG Interpretation None       Radiology Dg Ankle Complete Left  Result Date: 06/19/2017 CLINICAL DATA:  Injury this morning. Rolled left ankle. Left ankle pain. EXAM: LEFT ANKLE COMPLETE - 3+ VIEW COMPARISON:  None. FINDINGS: There is a fracture through the tip of the left lateral malleolus. Mild overlying soft tissue swelling. Joint spaces are maintained. IMPRESSION: Fracture through the tip of the lateral malleolus. Electronically Signed   By: Rolm Baptise M.D.   On: 06/19/2017 11:20   Dg Knee Complete 4 Views Right  Result Date: 06/19/2017 CLINICAL DATA:  Pt was walking dogs this a.m and stepped from curb rolling lt ankle causing pt to fall onto rt knee, complains of rt knee pain with abrasions anteriorly and severe lt ankle pain with soft tissue swelling laterally EXAM: RIGHT KNEE - COMPLETE 4+ VIEW COMPARISON:  None. FINDINGS: No evidence of fracture, dislocation, or joint effusion. No evidence of arthropathy or other focal bone abnormality. Soft tissues are unremarkable. IMPRESSION: No acute osseous injury of the right knee. Electronically Signed   By: Kathreen Devoid   On: 06/19/2017 11:22    Procedures Procedures (including critical care time)  Medications Ordered in ED Medications  oxyCODONE-acetaminophen (PERCOCET/ROXICET) 5-325 MG per tablet 1 tablet (1 tablet Oral Given 06/19/17 1206)     Initial Impression / Assessment and Plan / ED Course  I have reviewed the triage vital signs and the nursing notes.  Pertinent labs & imaging results that were available during my care of the patient were reviewed by me and considered in my medical decision making (see chart for details).     Patient X-Ray with nondisplaced lateral malleolar tip fracture to  the left  ankle. Pain managed in ED. Pt advised to follow up with orthopedics for further evaluation and treatment.  Pain managed in the department. Patient given short leg spline and crutches while in ED, conservative therapy recommended and discussed. After splint placement, on reexamination the patient is able to move all of her toes and sensation is intact.  Patient reports that she just recently was started on lithium therapy and was advised not to take NSAIDs. Spoke with pharmacy and verified with up-to-date that all NSAIDs have a class D interaction with lithium aside from Sulindac. Will provide a prescription of this medication and encourage the patient to use Tylenol. There is a class C interaction with opioids. Pharmacy recommended less frequent dosing and to discuss the risk of serotonin syndrome and to return if any symptoms appear. This information has been discussed with the patient who acknowledges and understands and is agreeable at this time.  Final Clinical Impressions(s) / ED Diagnoses   Final diagnoses:  Closed low lateral malleolus fracture, left, initial encounter  Abrasion of right knee, initial encounter    New Prescriptions New Prescriptions   OXYCODONE-ACETAMINOPHEN (PERCOCET/ROXICET) 5-325 MG TABLET    Take 1 tablet by mouth every 8 (eight) hours as needed for severe pain.   SULINDAC (CLINORIL) 150 MG TABLET    Take 1 tablet (150 mg total) by mouth 2 (two) times daily.     Joanne Gavel, PA-C 06/19/17 1210    Joline Maxcy A, PA-C 06/19/17 1254    Margette Fast, MD 06/19/17 [id number]

## 2017-06-20 ENCOUNTER — Ambulatory Visit (INDEPENDENT_AMBULATORY_CARE_PROVIDER_SITE_OTHER): Payer: BC Managed Care – PPO | Admitting: Family Medicine

## 2017-06-20 ENCOUNTER — Encounter: Payer: Self-pay | Admitting: Family Medicine

## 2017-06-20 DIAGNOSIS — S99912A Unspecified injury of left ankle, initial encounter: Secondary | ICD-10-CM

## 2017-06-20 MED ORDER — SULINDAC 150 MG PO TABS: 150.0000 mg | ORAL_TABLET | Freq: Two times a day (BID) | ORAL | 1 refills | Status: DC

## 2017-06-20 NOTE — Patient Instructions (Signed)
You have an avulsion fracture of your distal fibula. These are treated similarly to severe ankle sprains and take 6-8 weeks to recover. Ice the area for 15 minutes at a time, 3-4 times a day Sulindac as directed with food for pain and inflammation. Elevate above the level of your heart when possible Crutches if needed to help with walking Bear weight when tolerated. Use boot when up and walking around to help with stability while you recover from this injury. Come out of the boot twice a day to do Up/down and alphabet exercises 2-3 sets of each. Consider physical therapy for strengthening and balance exercises in the future. If not improving as expected, we may repeat x-rays or consider further testing like an MRI at 6 weeks. Follow up in 2 weeks for reevaluation.

## 2017-06-21 DIAGNOSIS — S99912D Unspecified injury of left ankle, subsequent encounter: Secondary | ICD-10-CM | POA: Insufficient documentation

## 2017-06-21 NOTE — Assessment & Plan Note (Signed)
independently reviewed radiographs - noted avulsion fracture below level of ankle joint of lateral malleolus.  Switch to cam walker.  Ambulation as tolerated, use crutches as needed.  Icing, sulindac (pharmacy noted only class C when taking lithium).  Shown motion exercises to do at least twice a day.  F/u in 2 weeks.  Should not need to repeat imaging unless not improving as expected.

## 2017-06-21 NOTE — Progress Notes (Signed)
PCP: Lanice Shirts, MD  Subjective:   HPI: Patient is a 56 y.o. female here for left ankle injury.  Patient reports on 8/23 she was walking the dogs when she inverted her ankle on uneven pavement. Caused her to fall hard onto right knee - has abrasion here. Her pain level is 5/10 and sharp lateral left ankle. Has been doing dressing changes right knee. Taking percocet and tylenol as needed for pain. Tolerating splint she was placed in by ED. Using crutches. Bruising and swelling. No numbness.  Past Medical History:  Diagnosis Date  . Allergy   . Anemia   . Anxiety   . Arthritis    right knee  . Degenerative disc disease   . Depression   . Family history of early CAD   . Fibromyalgia   . Fibromyalgia   . Hyperlipidemia   . IBS (irritable bowel syndrome)   . Lumbar radiculitis   . Plantar fasciitis of left foot   . PVC (premature ventricular contraction)   . Salmonella poisoning     Current Outpatient Prescriptions on File Prior to Visit  Medication Sig Dispense Refill  . acetic acid-hydrocortisone (VOSOL-HC) otic solution Place 4 drops into both ears daily. Use for one week and then off 3 weeks 10 mL 5  . aspirin 81 MG tablet Take 81 mg by mouth daily.    Marland Kitchen b complex vitamins tablet Take 1 tablet by mouth daily.    Marland Kitchen buPROPion (WELLBUTRIN XL) 300 MG 24 hr tablet Take 300 mg by mouth daily.    . Calcium Carbonate-Vitamin D (CALCIUM 500 + D) 500-125 MG-UNIT TABS Take 1 tablet by mouth daily.    Marland Kitchen estradiol (ESTRACE) 1 MG tablet TAKE 1 TABLET (1 MG TOTAL) BY MOUTH DAILY. 90 tablet 0  . ferrous sulfate 325 (65 FE) MG tablet Take 325 mg by mouth 3 (three) times a week.     . fish oil-omega-3 fatty acids 1000 MG capsule Take 1 g by mouth daily.    Marland Kitchen glucosamine-chondroitin 500-400 MG tablet Take 3 tablets by mouth daily.    . Multiple Vitamin (MULITIVITAMIN WITH MINERALS) TABS Take 1 tablet by mouth daily.    . nitroGLYCERIN (NITROSTAT) 0.4 MG SL tablet Place 1  tablet (0.4 mg total) under the tongue every 5 (five) minutes as needed for chest pain. 25 tablet 3  . oxyCODONE-acetaminophen (PERCOCET/ROXICET) 5-325 MG tablet Take 1 tablet by mouth every 8 (eight) hours as needed for severe pain. 10 tablet 0  . Probiotic Product (PROBIOTIC DAILY PO) Take 1 Can by mouth daily.    . progesterone (PROMETRIUM) 100 MG capsule Take 1 capsule (100 mg total) by mouth daily. 90 capsule 1   No current facility-administered medications on file prior to visit.     Past Surgical History:  Procedure Laterality Date  . COLONOSCOPY    . EYE SURGERY    . KNEE ARTHROSCOPY    . KNEE ARTHROSCOPY    . laproscopy    . TONSILLECTOMY      Allergies  Allergen Reactions  . Colace  [Docusate Calcium] Other (See Comments)    cramping  . Morphine And Related     Other reaction(s): Redness IV  . Other Rash    Medical tape, band aids  . Penicillins Hives and Rash  . Zithromax [Azithromycin] Rash and Hives  . Dulcolax [Bisacodyl]     Dehydration   . Effexor [Venlafaxine]     Intolerant SSRI/SNRI  . Erythromycin Nausea  And Vomiting  . Prednisone     Paranoid , anxious, hyper emotionally, could not sleep, odd dreams  . Tape Dermatitis    Social History   Social History  . Marital status: Married    Spouse name: N/A  . Number of children: 0  . Years of education: N/A   Occupational History  . RN [employer]  .  [employer]   Social History Main Topics  . Smoking status: Never Smoker  . Smokeless tobacco: Never Used  . Alcohol use No  . Drug use: No  . Sexual activity: Yes    Birth control/ protection: Post-menopausal   Other Topics Concern  . Not on file   Social History Narrative  . No narrative on file    Family History  Problem Relation Age of Onset  . Colon cancer Father   . Esophageal cancer Father   . Cancer Father   . Stroke Father   . Heart disease Father   . Hypertension Father   . Hyperlipidemia Father   . Diabetes  Father   . Prostate cancer Father   . Stomach cancer Father   . Heart disease Mother   . Arthritis Mother   . Heart attack Mother   . Mental illness Mother   . Diabetes Maternal Grandfather   . Heart disease Maternal Grandfather   . Mental illness Maternal Grandfather   . Stroke Maternal Grandfather   . Hypertension Brother   . Hyperlipidemia Brother   . Cancer Maternal Grandmother        circulatory cancer  . Heart disease Paternal Grandfather   . Diabetes Paternal Grandfather   . Pancreatic cancer Neg Hx   . Rectal cancer Neg Hx     Ht 5\' 6"  (1.676 m)   Wt 160 lb (72.6 kg)   LMP 10/28/2008   BMI 25.82 kg/m   Review of Systems: See HPI above.     Objective:  Physical Exam:  Gen: NAD, comfortable in exam room  Left ankle/foot: Splint removed. Mod swelling with some bruising lateral ankle and foot.  No other gross deformity. Mod limitation motions all directions. TTP lateral malleolus.  No other tenderness about foot or ankle. Deferred ant drawer and talar tilt.   Negative syndesmotic compression. Thompsons test negative. NV intact distally.  Right foot/ankle: FROM without pain.   Assessment & Plan:  1. Left ankle injury - independently reviewed radiographs - noted avulsion fracture below level of ankle joint of lateral malleolus.  Switch to cam walker.  Ambulation as tolerated, use crutches as needed.  Icing, sulindac (pharmacy noted only class C when taking lithium).  Shown motion exercises to do at least twice a day.  F/u in 2 weeks.  Should not need to repeat imaging unless not improving as expected.

## 2017-06-27 MED FILL — SULINDAC 150 MG TAB: 150 | 30 days supply | Qty: 60 | Fill #0

## 2017-06-27 MED FILL — buPROPion HCL ER (XL) 150 M: 150 | 90 days supply | Qty: 270 | Fill #1

## 2017-07-01 DIAGNOSIS — F4310 Post-traumatic stress disorder, unspecified: Secondary | ICD-10-CM | POA: Diagnosis not present

## 2017-07-01 DIAGNOSIS — F331 Major depressive disorder, recurrent, moderate: Secondary | ICD-10-CM | POA: Diagnosis not present

## 2017-07-03 DIAGNOSIS — F4321 Adjustment disorder with depressed mood: Secondary | ICD-10-CM | POA: Diagnosis not present

## 2017-07-04 ENCOUNTER — Encounter: Payer: Self-pay | Admitting: Family Medicine

## 2017-07-04 ENCOUNTER — Ambulatory Visit (INDEPENDENT_AMBULATORY_CARE_PROVIDER_SITE_OTHER): Payer: BC Managed Care – PPO | Admitting: Family Medicine

## 2017-07-04 DIAGNOSIS — S99912D Unspecified injury of left ankle, subsequent encounter: Secondary | ICD-10-CM

## 2017-07-04 NOTE — Patient Instructions (Signed)
You have an avulsion fracture of your distal fibula. Ice the area for 15 minutes at a time, 3-4 times a day as needed Sulindac as directed with food for pain and inflammation - transition to taking as needed when you can. Elevate above the level of your heart when possible Crutches if needed to help with walking - wean off hopefully over next 2 weeks. Use boot when up and walking around to help with stability while you recover from this injury. Come out of the boot twice a day to do Up/down and alphabet exercises 2-3 sets of each. Consider physical therapy for strengthening and balance exercises in the future. If not improving as expected, we may repeat x-rays or consider further testing like an MRI at 6 weeks. Follow up in 4 weeks for reevaluation.

## 2017-07-07 MED FILL — LITHIUM CARBONATE ER 300 MG: 300 | 30 days supply | Qty: 30 | Fill #0

## 2017-07-07 NOTE — Progress Notes (Signed)
PCP: Lanice Shirts, MD  Subjective:   HPI: Patient is a 56 y.o. female here for left ankle injury.  8/24: Patient reports on 8/23 she was walking the dogs when she inverted her ankle on uneven pavement. Caused her to fall hard onto right knee - has abrasion here. Her pain level is 5/10 and sharp lateral left ankle. Has been doing dressing changes right knee. Taking percocet and tylenol as needed for pain. Tolerating splint she was placed in by ED. Using crutches. Bruising and swelling. No numbness.  9/7: Patient reports she's improved from last visit. Pain level down to 2/10 and more dull. Has been icing, doing home motion exercises. taking sulindac twice a day. Gets shooting sensation at times lateral ankle. No skin changes, numbness. Swelling has improved.  Past Medical History:  Diagnosis Date  . Allergy   . Anemia   . Anxiety   . Arthritis    right knee  . Degenerative disc disease   . Depression   . Family history of early CAD   . Fibromyalgia   . Fibromyalgia   . Hyperlipidemia   . IBS (irritable bowel syndrome)   . Lumbar radiculitis   . Plantar fasciitis of left foot   . PVC (premature ventricular contraction)   . Salmonella poisoning     Current Outpatient Prescriptions on File Prior to Visit  Medication Sig Dispense Refill  . acetic acid-hydrocortisone (VOSOL-HC) otic solution Place 4 drops into both ears daily. Use for one week and then off 3 weeks 10 mL 5  . aspirin 81 MG tablet Take 81 mg by mouth daily.    Marland Kitchen b complex vitamins tablet Take 1 tablet by mouth daily.    Marland Kitchen buPROPion (WELLBUTRIN XL) 300 MG 24 hr tablet Take 300 mg by mouth daily.    . Calcium Carbonate-Vitamin D (CALCIUM 500 + D) 500-125 MG-UNIT TABS Take 1 tablet by mouth daily.    Marland Kitchen estradiol (ESTRACE) 1 MG tablet TAKE 1 TABLET (1 MG TOTAL) BY MOUTH DAILY. 90 tablet 0  . ferrous sulfate 325 (65 FE) MG tablet Take 325 mg by mouth 3 (three) times a week.     . fish oil-omega-3  fatty acids 1000 MG capsule Take 1 g by mouth daily.    Marland Kitchen glucosamine-chondroitin 500-400 MG tablet Take 3 tablets by mouth daily.    Marland Kitchen lithium carbonate (LITHOBID) 300 MG CR tablet   0  . Multiple Vitamin (MULITIVITAMIN WITH MINERALS) TABS Take 1 tablet by mouth daily.    . nitroGLYCERIN (NITROSTAT) 0.4 MG SL tablet Place 1 tablet (0.4 mg total) under the tongue every 5 (five) minutes as needed for chest pain. 25 tablet 3  . oxyCODONE-acetaminophen (PERCOCET/ROXICET) 5-325 MG tablet Take 1 tablet by mouth every 8 (eight) hours as needed for severe pain. 10 tablet 0  . Probiotic Product (PROBIOTIC DAILY PO) Take 1 Can by mouth daily.    . progesterone (PROMETRIUM) 100 MG capsule Take 1 capsule (100 mg total) by mouth daily. 90 capsule 1  . sulindac (CLINORIL) 150 MG tablet Take 1 tablet (150 mg total) by mouth 2 (two) times daily. 60 tablet 1   No current facility-administered medications on file prior to visit.     Past Surgical History:  Procedure Laterality Date  . COLONOSCOPY    . EYE SURGERY    . KNEE ARTHROSCOPY    . KNEE ARTHROSCOPY    . laproscopy    . TONSILLECTOMY  Allergies  Allergen Reactions  . Colace  [Docusate Calcium] Other (See Comments)    cramping  . Morphine And Related     Other reaction(s): Redness IV  . Other Rash    Medical tape, band aids  . Penicillins Hives and Rash  . Zithromax [Azithromycin] Rash and Hives  . Dulcolax [Bisacodyl]     Dehydration   . Effexor [Venlafaxine]     Intolerant SSRI/SNRI  . Erythromycin Nausea And Vomiting  . Prednisone     Paranoid , anxious, hyper emotionally, could not sleep, odd dreams  . Tape Dermatitis    Social History   Social History  . Marital status: Married    Spouse name: N/A  . Number of children: 0  . Years of education: N/A   Occupational History  . RN [employer]  .  [employer]   Social History Main Topics  . Smoking status: Never Smoker  . Smokeless tobacco: Never Used  .  Alcohol use No  . Drug use: No  . Sexual activity: Yes    Birth control/ protection: Post-menopausal   Other Topics Concern  . Not on file   Social History Narrative  . No narrative on file    Family History  Problem Relation Age of Onset  . Colon cancer Father   . Esophageal cancer Father   . Cancer Father   . Stroke Father   . Heart disease Father   . Hypertension Father   . Hyperlipidemia Father   . Diabetes Father   . Prostate cancer Father   . Stomach cancer Father   . Heart disease Mother   . Arthritis Mother   . Heart attack Mother   . Mental illness Mother   . Diabetes Maternal Grandfather   . Heart disease Maternal Grandfather   . Mental illness Maternal Grandfather   . Stroke Maternal Grandfather   . Hypertension Brother   . Hyperlipidemia Brother   . Cancer Maternal Grandmother        circulatory cancer  . Heart disease Paternal Grandfather   . Diabetes Paternal Grandfather   . Pancreatic cancer Neg Hx   . Rectal cancer Neg Hx     BP 113/77   Pulse 75   Ht 5\' 6"  (1.676 m)   Wt 160 lb (72.6 kg)   LMP 10/28/2008   BMI 25.82 kg/m   Review of Systems: See HPI above.     Objective:  Physical Exam:  Gen: NAD, comfortable in exam room  Left ankle/foot: Minimal swelling lateral ankle and foot.  Tiny amount of bruising over lateral calcaneus.  No other gross deformity. Mild limitation motions all directions. TTP lateral malleolus.  No other tenderness about foot or ankle. Deferred ant drawer and talar tilt.   Negative syndesmotic compression. Thompsons test negative. NV intact distally.  Right foot/ankle: FROM without pain.   Assessment & Plan:  1. Left ankle injury - 2/2 avulsion fracture below level of ankle joint of lateral malleolus.  Clinically improving.  Continue cam walker.  Crutches if needed.  Icing, continue sulindac.  Continue motion exercises.  F/u in 4 weeks for reevaluation.

## 2017-07-07 NOTE — Assessment & Plan Note (Signed)
2/2 avulsion fracture below level of ankle joint of lateral malleolus.  Clinically improving.  Continue cam walker.  Crutches if needed.  Icing, continue sulindac.  Continue motion exercises.  F/u in 4 weeks for reevaluation.

## 2017-07-15 DIAGNOSIS — F331 Major depressive disorder, recurrent, moderate: Secondary | ICD-10-CM | POA: Diagnosis not present

## 2017-07-15 DIAGNOSIS — F4310 Post-traumatic stress disorder, unspecified: Secondary | ICD-10-CM | POA: Diagnosis not present

## 2017-07-22 DIAGNOSIS — H35421 Microcystoid degeneration of retina, right eye: Secondary | ICD-10-CM | POA: Diagnosis not present

## 2017-07-22 DIAGNOSIS — H35371 Puckering of macula, right eye: Secondary | ICD-10-CM | POA: Diagnosis not present

## 2017-07-22 DIAGNOSIS — H35433 Paving stone degeneration of retina, bilateral: Secondary | ICD-10-CM | POA: Diagnosis not present

## 2017-07-22 DIAGNOSIS — H35363 Drusen (degenerative) of macula, bilateral: Secondary | ICD-10-CM | POA: Diagnosis not present

## 2017-07-28 DIAGNOSIS — F4310 Post-traumatic stress disorder, unspecified: Secondary | ICD-10-CM | POA: Diagnosis not present

## 2017-07-28 DIAGNOSIS — F331 Major depressive disorder, recurrent, moderate: Secondary | ICD-10-CM | POA: Diagnosis not present

## 2017-07-30 ENCOUNTER — Other Ambulatory Visit: Payer: Self-pay | Admitting: Internal Medicine

## 2017-07-30 ENCOUNTER — Telehealth: Payer: Self-pay | Admitting: Gastroenterology

## 2017-07-30 DIAGNOSIS — Z1231 Encounter for screening mammogram for malignant neoplasm of breast: Secondary | ICD-10-CM

## 2017-07-30 NOTE — Telephone Encounter (Signed)
The pt was advised that her last procedure was 2015 and is due 07/2019.  I have sent a copy of her last colon to her via My Chart

## 2017-08-01 ENCOUNTER — Encounter: Payer: Self-pay | Admitting: Family Medicine

## 2017-08-01 ENCOUNTER — Ambulatory Visit (INDEPENDENT_AMBULATORY_CARE_PROVIDER_SITE_OTHER): Payer: BC Managed Care – PPO | Admitting: Family Medicine

## 2017-08-01 DIAGNOSIS — S99912D Unspecified injury of left ankle, subsequent encounter: Secondary | ICD-10-CM

## 2017-08-01 NOTE — Patient Instructions (Signed)
You have an avulsion fracture of your distal fibula. Ice the area for 15 minutes at a time, 3-4 times a day as needed Sulindac only if needed. Use boot only with very long walking the next few days. Otherwise for next 2 weeks wear supportive shoes (tennis shoes, running shoes). Add theraband strengthening exercises 3 sets of 10 once a day. Can do single leg balance on this leg too for 15 seconds at t ime. Consider physical therapy for strengthening and balance exercises in the future - call me in 2 weeks if you're struggling and we can order this. If not improving as expected, we may repeat x-rays or consider further testing like an MRI. Follow up in 4 weeks for reevaluation. I would wait about 2 weeks to run but other activities are ok.

## 2017-08-03 NOTE — Progress Notes (Signed)
PCP: Lanice Shirts, MD  Subjective:   HPI: Patient is a 56 y.o. female here for left ankle injury.  8/24: Patient reports on 8/23 she was walking the dogs when she inverted her ankle on uneven pavement. Caused her to fall hard onto right knee - has abrasion here. Her pain level is 5/10 and sharp lateral left ankle. Has been doing dressing changes right knee. Taking percocet and tylenol as needed for pain. Tolerating splint she was placed in by ED. Using crutches. Bruising and swelling. No numbness.  9/7: Patient reports she's improved from last visit. Pain level down to 2/10 and more dull. Has been icing, doing home motion exercises. taking sulindac twice a day. Gets shooting sensation at times lateral ankle. No skin changes, numbness. Swelling has improved.  10/5: Patient reports she's doing well. Pain level is minimal. She takes sulindac only if needed. Some soreness only. Had a sharp pain at one time but was very fleeting. Doing well with ambulation in boot. No skin changes, numbness.  Past Medical History:  Diagnosis Date  . Allergy   . Anemia   . Anxiety   . Arthritis    right knee  . Degenerative disc disease   . Depression   . Family history of early CAD   . Fibromyalgia   . Fibromyalgia   . Hyperlipidemia   . IBS (irritable bowel syndrome)   . Lumbar radiculitis   . Plantar fasciitis of left foot   . PVC (premature ventricular contraction)   . Salmonella poisoning     Current Outpatient Prescriptions on File Prior to Visit  Medication Sig Dispense Refill  . acetic acid-hydrocortisone (VOSOL-HC) otic solution Place 4 drops into both ears daily. Use for one week and then off 3 weeks 10 mL 5  . aspirin 81 MG tablet Take 81 mg by mouth daily.    Marland Kitchen b complex vitamins tablet Take 1 tablet by mouth daily.    Marland Kitchen buPROPion (WELLBUTRIN XL) 300 MG 24 hr tablet Take 300 mg by mouth daily.    . Calcium Carbonate-Vitamin D (CALCIUM 500 + D) 500-125  MG-UNIT TABS Take 1 tablet by mouth daily.    Marland Kitchen estradiol (ESTRACE) 1 MG tablet TAKE 1 TABLET (1 MG TOTAL) BY MOUTH DAILY. 90 tablet 0  . ferrous sulfate 325 (65 FE) MG tablet Take 325 mg by mouth 3 (three) times a week.     . fish oil-omega-3 fatty acids 1000 MG capsule Take 1 g by mouth daily.    Marland Kitchen glucosamine-chondroitin 500-400 MG tablet Take 3 tablets by mouth daily.    Marland Kitchen lithium carbonate (LITHOBID) 300 MG CR tablet   0  . Multiple Vitamin (MULITIVITAMIN WITH MINERALS) TABS Take 1 tablet by mouth daily.    . nitroGLYCERIN (NITROSTAT) 0.4 MG SL tablet Place 1 tablet (0.4 mg total) under the tongue every 5 (five) minutes as needed for chest pain. 25 tablet 3  . oxyCODONE-acetaminophen (PERCOCET/ROXICET) 5-325 MG tablet Take 1 tablet by mouth every 8 (eight) hours as needed for severe pain. 10 tablet 0  . Probiotic Product (PROBIOTIC DAILY PO) Take 1 Can by mouth daily.    . progesterone (PROMETRIUM) 100 MG capsule Take 1 capsule (100 mg total) by mouth daily. 90 capsule 1  . sulindac (CLINORIL) 150 MG tablet Take 1 tablet (150 mg total) by mouth 2 (two) times daily. 60 tablet 1   No current facility-administered medications on file prior to visit.     Past Surgical  History:  Procedure Laterality Date  . COLONOSCOPY    . EYE SURGERY    . KNEE ARTHROSCOPY    . KNEE ARTHROSCOPY    . laproscopy    . TONSILLECTOMY      Allergies  Allergen Reactions  . Colace  [Docusate Calcium] Other (See Comments)    cramping  . Morphine And Related     Other reaction(s): Redness IV  . Other Rash    Medical tape, band aids  . Penicillins Hives and Rash  . Zithromax [Azithromycin] Rash and Hives  . Dulcolax [Bisacodyl]     Dehydration   . Effexor [Venlafaxine]     Intolerant SSRI/SNRI  . Erythromycin Nausea And Vomiting  . Prednisone     Paranoid , anxious, hyper emotionally, could not sleep, odd dreams  . Tape Dermatitis    Social History   Social History  . Marital status: Married     Spouse name: N/A  . Number of children: 0  . Years of education: N/A   Occupational History  . RN [employer]  .  [employer]   Social History Main Topics  . Smoking status: Never Smoker  . Smokeless tobacco: Never Used  . Alcohol use No  . Drug use: No  . Sexual activity: Yes    Birth control/ protection: Post-menopausal   Other Topics Concern  . Not on file   Social History Narrative  . No narrative on file    Family History  Problem Relation Age of Onset  . Colon cancer Father   . Esophageal cancer Father   . Cancer Father   . Stroke Father   . Heart disease Father   . Hypertension Father   . Hyperlipidemia Father   . Diabetes Father   . Prostate cancer Father   . Stomach cancer Father   . Heart disease Mother   . Arthritis Mother   . Heart attack Mother   . Mental illness Mother   . Diabetes Maternal Grandfather   . Heart disease Maternal Grandfather   . Mental illness Maternal Grandfather   . Stroke Maternal Grandfather   . Hypertension Brother   . Hyperlipidemia Brother   . Cancer Maternal Grandmother        circulatory cancer  . Heart disease Paternal Grandfather   . Diabetes Paternal Grandfather   . Pancreatic cancer Neg Hx   . Rectal cancer Neg Hx     BP 123/78   Ht 5\' 7"  (1.702 m)   Wt 160 lb (72.6 kg)   LMP 10/28/2008   BMI 25.06 kg/m   Review of Systems: See HPI above.     Objective:  Physical Exam:  Gen: NAD, comfortable in exam room.  Left ankle/foot: Minimal swelling lateral ankle.  No bruising, other deformity. FROM TTP minimally lateral malleolus.  No other tenderness. Negative ant drawer and talar tilt.   Negative syndesmotic compression. Thompsons test negative. NV intact distally.  Right foot/ankle: FROM without pain.   Assessment & Plan:  1. Left ankle injury - 2/2 avulsion fracture below ankle joint of lateral malleolus.  Much improved.  Can still see fracture line on ultrasound but clinically  better.  Sulindac if needed.  Transition to supportive shoes.  Shown home exercises to do daily including balance exercise.  Consider physical therapy if not improving.  Repeat imaging also a consideration.    Total visit time 25 minutes - > 50% of which spent on counseling, answering questions related to  return to gym.

## 2017-08-03 NOTE — Assessment & Plan Note (Signed)
2/2 avulsion fracture below ankle joint of lateral malleolus.  Much improved.  Can still see fracture line on ultrasound but clinically better.  Sulindac if needed.  Transition to supportive shoes.  Shown home exercises to do daily including balance exercise.  Consider physical therapy if not improving.  Repeat imaging also a consideration.    Total visit time 25 minutes - > 50% of which spent on counseling, answering questions related to return to gym.

## 2017-08-04 DIAGNOSIS — F4321 Adjustment disorder with depressed mood: Secondary | ICD-10-CM | POA: Diagnosis not present

## 2017-08-07 ENCOUNTER — Ambulatory Visit
Admission: RE | Admit: 2017-08-07 | Discharge: 2017-08-07 | Disposition: A | Discharge status: Home / Self Care | Payer: BC Managed Care – PPO | Source: Ambulatory Visit | Attending: Internal Medicine | Admitting: Internal Medicine

## 2017-08-07 DIAGNOSIS — Z1231 Encounter for screening mammogram for malignant neoplasm of breast: Secondary | ICD-10-CM | POA: Diagnosis not present

## 2017-08-13 MED FILL — PROGESTERONE 100 MG CAPSULE: 100 | 90 days supply | Qty: 90 | Fill #0

## 2017-08-13 MED FILL — ESTRADIOL 1 MG TAB: 1 | 90 days supply | Qty: 90 | Fill #0

## 2017-08-13 MED FILL — LITHIUM CARBONATE ER 300 MG: 300 | 30 days supply | Qty: 30 | Fill #1

## 2017-08-19 ENCOUNTER — Encounter: Payer: Self-pay | Admitting: Cardiology

## 2017-08-21 ENCOUNTER — Ambulatory Visit: Payer: BC Managed Care – PPO | Admitting: Cardiology

## 2017-08-29 ENCOUNTER — Encounter: Payer: Self-pay | Admitting: Family Medicine

## 2017-08-29 ENCOUNTER — Ambulatory Visit (INDEPENDENT_AMBULATORY_CARE_PROVIDER_SITE_OTHER): Payer: BC Managed Care – PPO | Admitting: Family Medicine

## 2017-08-29 VITALS — BP 125/75 | HR 76 | Ht 66.0 in | Wt 164.0 lb

## 2017-08-29 DIAGNOSIS — S99912D Unspecified injury of left ankle, subsequent encounter: Secondary | ICD-10-CM | POA: Diagnosis not present

## 2017-08-29 NOTE — Patient Instructions (Signed)
You have an avulsion fracture of your distal fibula. Ice the area for 15 minutes at a time, 3-4 times a day as needed Sulindac if needed. Start physical therapy and do home exercises on days you don't go to therapy. Follow up with me in 6 weeks for reevaluation.

## 2017-08-30 NOTE — Assessment & Plan Note (Signed)
2/2 avulsion fracture below ankle joint.  Clinically improved though fracture line still visible on ultrasound and she still is not back to full activities.  Discussed options - will start physical therapy.  Icing, sulindac only if needed.  We discussed what surgical options would be if she struggles.

## 2017-08-30 NOTE — Progress Notes (Signed)
PCP: Lanice Shirts, MD  Subjective:   HPI: Patient is a 56 y.o. female here for left ankle injury.  8/24: Patient reports on 8/23 she was walking the dogs when she inverted her ankle on uneven pavement. Caused her to fall hard onto right knee - has abrasion here. Her pain level is 5/10 and sharp lateral left ankle. Has been doing dressing changes right knee. Taking percocet and tylenol as needed for pain. Tolerating splint she was placed in by ED. Using crutches. Bruising and swelling. No numbness.  9/7: Patient reports she's improved from last visit. Pain level down to 2/10 and more dull. Has been icing, doing home motion exercises. taking sulindac twice a day. Gets shooting sensation at times lateral ankle. No skin changes, numbness. Swelling has improved.  10/5: Patient reports she's doing well. Pain level is minimal. She takes sulindac only if needed. Some soreness only. Had a sharp pain at one time but was very fleeting. Doing well with ambulation in boot. No skin changes, numbness.  11/2: Patient reports she's doing well. Has made continued slow progress. Some stiffness, soreness lateral ankle. Slight swelling. Pain currently 0/10. Able to walk about 30 minutes twice a week. She's doing home exercises. Balance is a concern and still feels ankle is weak though. No skin changes, numbness.  Past Medical History:  Diagnosis Date  . Allergy   . Anemia   . Anxiety   . Arthritis    right knee  . Degenerative disc disease   . Depression   . Family history of early CAD   . Fibromyalgia   . Fibromyalgia   . Hyperlipidemia   . IBS (irritable bowel syndrome)   . Lumbar radiculitis   . Plantar fasciitis of left foot   . PVC (premature ventricular contraction)   . Salmonella poisoning     Current Outpatient Prescriptions on File Prior to Visit  Medication Sig Dispense Refill  . acetic acid-hydrocortisone (VOSOL-HC) otic solution Place 4 drops into  both ears daily. Use for one week and then off 3 weeks 10 mL 5  . aspirin 81 MG tablet Take 81 mg by mouth daily.    Marland Kitchen b complex vitamins tablet Take 1 tablet by mouth daily.    Marland Kitchen buPROPion (WELLBUTRIN XL) 300 MG 24 hr tablet Take 300 mg by mouth daily.    . Calcium Carbonate-Vitamin D (CALCIUM 500 + D) 500-125 MG-UNIT TABS Take 1 tablet by mouth daily.    Marland Kitchen estradiol (ESTRACE) 1 MG tablet TAKE 1 TABLET (1 MG TOTAL) BY MOUTH DAILY. 90 tablet 0  . ferrous sulfate 325 (65 FE) MG tablet Take 325 mg by mouth 3 (three) times a week.     . fish oil-omega-3 fatty acids 1000 MG capsule Take 1 g by mouth daily.    Marland Kitchen glucosamine-chondroitin 500-400 MG tablet Take 3 tablets by mouth daily.    Marland Kitchen lithium carbonate (LITHOBID) 300 MG CR tablet   0  . Multiple Vitamin (MULITIVITAMIN WITH MINERALS) TABS Take 1 tablet by mouth daily.    . nitroGLYCERIN (NITROSTAT) 0.4 MG SL tablet Place 1 tablet (0.4 mg total) under the tongue every 5 (five) minutes as needed for chest pain. 25 tablet 3  . oxyCODONE-acetaminophen (PERCOCET/ROXICET) 5-325 MG tablet Take 1 tablet by mouth every 8 (eight) hours as needed for severe pain. 10 tablet 0  . Probiotic Product (PROBIOTIC DAILY PO) Take 1 Can by mouth daily.    . progesterone (PROMETRIUM) 100 MG capsule Take  1 capsule (100 mg total) by mouth daily. 90 capsule 1  . sulindac (CLINORIL) 150 MG tablet Take 1 tablet (150 mg total) by mouth 2 (two) times daily. 60 tablet 1   No current facility-administered medications on file prior to visit.     Past Surgical History:  Procedure Laterality Date  . COLONOSCOPY    . EYE SURGERY    . KNEE ARTHROSCOPY    . KNEE ARTHROSCOPY    . laproscopy    . TONSILLECTOMY      Allergies  Allergen Reactions  . Colace  [Docusate Calcium] Other (See Comments)    cramping  . Morphine And Related     Other reaction(s): Redness IV  . Other Rash    Medical tape, band aids  . Penicillins Hives and Rash  . Zithromax [Azithromycin] Rash  and Hives  . Dulcolax [Bisacodyl]     Dehydration   . Effexor [Venlafaxine]     Intolerant SSRI/SNRI  . Erythromycin Nausea And Vomiting  . Prednisone     Paranoid , anxious, hyper emotionally, could not sleep, odd dreams  . Tape Dermatitis    Social History   Social History  . Marital status: Married    Spouse name: N/A  . Number of children: 0  . Years of education: N/A   Occupational History  . RN [employer]  .  [employer]   Social History Main Topics  . Smoking status: Never Smoker  . Smokeless tobacco: Never Used  . Alcohol use No  . Drug use: No  . Sexual activity: Yes    Birth control/ protection: Post-menopausal   Other Topics Concern  . Not on file   Social History Narrative  . No narrative on file    Family History  Problem Relation Age of Onset  . Colon cancer Father   . Esophageal cancer Father   . Cancer Father   . Stroke Father   . Heart disease Father   . Hypertension Father   . Hyperlipidemia Father   . Diabetes Father   . Prostate cancer Father   . Stomach cancer Father   . Heart disease Mother   . Arthritis Mother   . Heart attack Mother   . Mental illness Mother   . Diabetes Maternal Grandfather   . Heart disease Maternal Grandfather   . Mental illness Maternal Grandfather   . Stroke Maternal Grandfather   . Hypertension Brother   . Hyperlipidemia Brother   . Cancer Maternal Grandmother        circulatory cancer  . Heart disease Paternal Grandfather   . Diabetes Paternal Grandfather   . Pancreatic cancer Neg Hx   . Rectal cancer Neg Hx     BP 125/75   Pulse 76   Ht 5\' 6"  (1.676 m)   Wt 164 lb (74.4 kg)   LMP 10/28/2008   BMI 26.47 kg/m   Review of Systems: See HPI above.     Objective:  Physical Exam:  Gen: NAD, comfortable in exam room.  Left ankle/foot: Minimal lateral swelling.  No bruising, other deformity. FROM with 5/5 gross strength. TTP minimally lateral malleolus but above the level of  fracture.  No other tenderness. Negative ant drawer and talar tilt.   Negative syndesmotic compression. Thompsons test negative. NV intact distally.  Right foot/ankle: FROM without pain.   Assessment & Plan:  1. Left ankle injury - 2/2 avulsion fracture below ankle joint.  Clinically improved though fracture line  still visible on ultrasound and she still is not back to full activities.  Discussed options - will start physical therapy.  Icing, sulindac only if needed.  We discussed what surgical options would be if she struggles.

## 2017-09-01 DIAGNOSIS — F4310 Post-traumatic stress disorder, unspecified: Secondary | ICD-10-CM | POA: Diagnosis not present

## 2017-09-01 DIAGNOSIS — F331 Major depressive disorder, recurrent, moderate: Secondary | ICD-10-CM | POA: Diagnosis not present

## 2017-09-02 ENCOUNTER — Encounter: Payer: Self-pay | Admitting: Physical Therapy

## 2017-09-02 ENCOUNTER — Ambulatory Visit: Payer: BC Managed Care – PPO | Attending: Family Medicine | Admitting: Physical Therapy

## 2017-09-02 DIAGNOSIS — M6281 Muscle weakness (generalized): Secondary | ICD-10-CM

## 2017-09-02 DIAGNOSIS — M25672 Stiffness of left ankle, not elsewhere classified: Secondary | ICD-10-CM

## 2017-09-02 DIAGNOSIS — R2681 Unsteadiness on feet: Secondary | ICD-10-CM | POA: Insufficient documentation

## 2017-09-02 NOTE — Patient Instructions (Signed)
  Ankle 4way with TB  All theraband exercise is slow and controlled. Do not let the band "bounce" back. A. Plantarflexion: "gas pedal." Keep knee straight.Band around "ball of foot" and press it away as far as possible and slowly return to neutral. Repeat. B. Dorsiflexion: start in neutral and pull theraband back toward you as far as possible. pause. return slowly. keep knee straight. C.Inversion: start neutral and bring band toward your midline without bending or twisting knee. D. Eversion: start neutral and press band out without bending or twisting knee.    30 reps, 2-3x/day    STANDING HEEL RAISES  While standing, raise up on your toes as you lift your heels off the ground.  x20 reps   [facility] [address], [place], [place] [address] Phone # [phone] Fax [phone]

## 2017-09-02 NOTE — Progress Notes (Deleted)
Cardiology Office Note:    Date:  09/02/2017   ID:  Megan Combs, DOB 1960-11-05, MRN [id number]  PCP:  Megan Shirts, MD  Cardiologist:  Dr. Fransico Him  Referring MD: Megan Combs, *   No chief complaint on file. ***  History of Present Illness:    Megan Combs is a 56 y.o. female with a past medical history significant for dyslipidemia, palpitations (PVCs on monitor) and family history of CAD. She had chest pain in 2017 with low risk nuclear stress test.  Past Medical History:  Diagnosis Date  . Allergy   . Anemia   . Anxiety   . Arthritis    right knee  . Degenerative disc disease   . Depression   . Family history of early CAD   . Fibromyalgia   . Fibromyalgia   . Hyperlipidemia   . IBS (irritable bowel syndrome)   . Lumbar radiculitis   . Plantar fasciitis of left foot   . PVC (premature ventricular contraction)   . Salmonella poisoning     Past Surgical History:  Procedure Laterality Date  . COLONOSCOPY    . EYE SURGERY    . KNEE ARTHROSCOPY    . KNEE ARTHROSCOPY    . laproscopy    . TONSILLECTOMY      Current Medications: No outpatient medications have been marked as taking for the 09/04/17 encounter (Appointment) with Megan Pandy, PA-C.     Allergies:   Colace  [docusate calcium]; Morphine and related; Other; Penicillins; Zithromax [azithromycin]; Dulcolax [bisacodyl]; Effexor [venlafaxine]; Erythromycin; Prednisone; and Tape   Social History   Socioeconomic History  . Marital status: Married    Spouse name: Not on file  . Number of children: 0  . Years of education: Not on file  . Highest education level: Not on file  Social Needs  . Financial resource strain: Not on file  . Food insecurity - worry: Not on file  . Food insecurity - inability: Not on file  . Transportation needs - medical: Not on file  . Transportation needs - non-medical: Not on file  Occupational History  . Occupation: Programmer, multimedia:  [employer], production: [employer]  Tobacco Use  . Smoking status: Never Smoker  . Smokeless tobacco: Never Used  Substance and Sexual Activity  . Alcohol use: No    Alcohol/week: 0.0 oz  . Drug use: No  . Sexual activity: Yes    Birth control/protection: Post-menopausal  Other Topics Concern  . Not on file  Social History Narrative  . Not on file     Family History: The patient's ***family history includes Arthritis in her mother; Cancer in her father and maternal grandmother; Colon cancer in her father; Diabetes in her father, maternal grandfather, and paternal grandfather; Esophageal cancer in her father; Heart attack in her mother; Heart disease in her father, maternal grandfather, mother, and paternal grandfather; Hyperlipidemia in her brother and father; Hypertension in her brother and father; Mental illness in her maternal grandfather and mother; Prostate cancer in her father; Stomach cancer in her father; Stroke in her father and maternal grandfather. There is no history of Pancreatic cancer or Rectal cancer. ROS:   Please see the history of present illness.    *** All other systems reviewed and are negative.  EKGs/Labs/Other Studies Reviewed:    The following studies were reviewed today:  Myocardial perfusion images 04/12/2016 Study Highlights    Nuclear  stress EF: 53%.  There was no ST segment deviation noted during stress.  The study is normal.  This is a low risk study.  The left ventricular ejection fraction is mildly decreased (45-54%).   Normal resting and stress perfusion EF 53%      EKG:  EKG is *** ordered today.  The ekg ordered today demonstrates ***  Recent Labs: No results found for requested labs within last 8760 hours.   Recent Lipid Panel    Component Value Date/Time   CHOL 197 07/26/2016 0811   TRIG 51 07/26/2016 0811   HDL 86 07/26/2016 0811   CHOLHDL 2.3 07/26/2016 0811   VLDL 10 07/26/2016 0811   LDLCALC 101 07/26/2016  0811    Physical Exam:    VS:  LMP 10/28/2008     Wt Readings from Last 3 Encounters:  08/29/17 164 lb (74.4 kg)  08/01/17 160 lb (72.6 kg)  07/04/17 160 lb (72.6 kg)     Physical Exam***   ASSESSMENT:    No diagnosis found. PLAN:    In order of problems listed above:  1. PVCs  2. Hyperlipidemia: Last LDL 101 in 06/2016    Medication Adjustments/Labs and Tests Ordered: Current medicines are reviewed at length with the patient today.  Concerns regarding medicines are outlined above. Labs and tests ordered and medication changes are outlined in the patient instructions below:  There are no Patient Instructions on file for this visit.   Signed, Megan Perch, NP  09/02/2017 5:29 PM    [facility]

## 2017-09-02 NOTE — Therapy (Signed)
[facility] [address] [place], [place], [address] Phone: [phone]   Fax:  [phone]  Physical Therapy Evaluation  Patient Details  Name: Megan Combs MRN: [id number] Date of Birth: Jan 14, 1961 Referring Provider: Karlton Lemon, MD    Encounter Date: 09/02/2017  PT End of Session - 09/02/17 0841    Visit Number  1    Number of Visits  13    Authorization Type  BCBS    Authorization Time Period  09/02/17 to 10/14/17    PT Start Time  0802    PT Stop Time  0845    PT Time Calculation (min)  43 min    Activity Tolerance  Patient tolerated treatment well;No increased pain    Behavior During Therapy  WFL for tasks assessed/performed       Past Medical History:  Diagnosis Date  . Allergy   . Anemia   . Anxiety   . Arthritis    right knee  . Degenerative disc disease   . Depression   . Family history of early CAD   . Fibromyalgia   . Fibromyalgia   . Hyperlipidemia   . IBS (irritable bowel syndrome)   . Lumbar radiculitis   . Plantar fasciitis of left foot   . PVC (premature ventricular contraction)   . Salmonella poisoning     Past Surgical History:  Procedure Laterality Date  . COLONOSCOPY    . EYE SURGERY    . KNEE ARTHROSCOPY    . KNEE ARTHROSCOPY    . laproscopy    . TONSILLECTOMY      There were no vitals filed for this visit.   Subjective Assessment - 09/02/17 0808    Subjective  Pt reports thta she was walking her dogs on 06/19/17 when she fell and broke her ankle. She did not have surgery, but she was instructed to wear a boot for about 7 weeks. She does not have much pain currenty, but she still feels like it is weak, and stiff. She has been doing some exercises with her bands and would like to be stronger and be able to return to all of her usual activity.     Pertinent History  fibromyalgia, HLD, Lumbar radiculitis     Limitations  Other (comment)    How long can you sit  comfortably?  unlimited    How long can you stand comfortably?  unlimited    How long can you walk comfortably?  currently only up to 30 min walks    Diagnostic tests  ultrasound: fractue healed     Patient Stated Goals  improve ankle strength and ROM     Currently in Pain?  No/denies         [facility] PT Assessment - 09/02/17 0001      Assessment   Referring Provider  Karlton Lemon, MD     Onset Date/Surgical Date  06/19/17    Next MD Visit  in 6 weeks    Prior Therapy  none       Precautions   Precautions  None      Restrictions   Weight Bearing Restrictions  No      Balance Screen   Has the patient fallen in the past 6 months  Yes    How many times?  1 cause of injury   cause of injury   Has the patient had a decrease in activity level because of a fear of falling?  Yes    Is the patient reluctant to leave their home because of a fear of falling?   No      Prior Function   Level of Independence  Independent    Vocation Requirements  currently searching for a job     Leisure  going on walks with her dogs, yoga, aerobics classes       Cognition   Overall Cognitive Status  Within Functional Limits for tasks assessed      Observation/Other Assessments   Focus on Therapeutic Outcomes (FOTO)   44% limited       Functional Tests   Functional tests  Single leg stance      Single Leg Stance   Comments  EO: Rt and Lt 15 sec, EC: Rt and Lt 2-3 sec; on foam Rt 15 sec, Lt 3 sec       ROM / Strength   AROM / PROM / Strength  AROM;Strength      AROM   AROM Assessment Site  Ankle    Right/Left Ankle  Right;Left    Right Ankle Dorsiflexion  5    Right Ankle Plantar Flexion  65    Right Ankle Inversion  65    Right Ankle Eversion  35    Left Ankle Dorsiflexion  8    Left Ankle Plantar Flexion  60    Left Ankle Inversion  55    Left Ankle Eversion  35      Strength   Strength Assessment Site  Ankle    Right/Left Ankle  Right;Left    Right Ankle Dorsiflexion  5/5     Right Ankle Plantar Flexion  5/5    Right Ankle Inversion  5/5    Right Ankle Eversion  5/5    Left Ankle Dorsiflexion  5/5    Left Ankle Plantar Flexion  3/5    Left Ankle Inversion  5/5    Left Ankle Eversion  5/5      Palpation   Palpation comment  non tender with palpation       Ambulation/Gait   Gait Comments  decreased pushoff on Rt              Objective measurements completed on examination: See above findings.      [facility] Adult PT Treatment/Exercise - 09/02/17 0001      Exercises   Exercises  Ankle      Ankle Exercises: Standing   Heel Raises Limitations  single leg x15-25 reps each LE       Ankle Exercises: Supine   T-Band  Lt ankle inversion/eversion with blue TB x20 reps              PT Education - 09/02/17 0851    Education provided  Yes    Education Details  eval findings/POC; encouraged pt to ease back into walking program and continue with ice/elevation as needed for swelling; implemented and reviewed HEP; discussed benefits of propriocpetion exercise to decrease risk of ankle sprains/fractures in the future    Person(s) Educated  Patient    Methods  Explanation;Handout    Comprehension  Verbalized understanding;Returned demonstration       PT Short Term Goals - 09/02/17 0854      PT SHORT TERM GOAL #1   Title  Pt will demo consistency and independence with HEP to improve ankle strength and ROM.     Time  3    Period  Weeks    Status  New    Target Date  09/23/17      PT SHORT TERM GOAL #2   Title  Pt will demo improved bilateral ankle dorsiflexion active ROM to atleast 10 deg which will assist with foot clearance during ambulation.     Time  3    Period  Weeks    Status  New      PT SHORT TERM GOAL #3   Title  Pt will demo improved ankle proprioception evident by her ability to maintain SLS for atleast 15 sec on the Lt without postural sway or LOB, 2/3 trials.     Time  3    Period  Weeks    Status  New        PT Long Term  Goals - 09/02/17 0857      PT LONG TERM GOAL #1   Title  Pt will demo understanding and independence with her advanced HEP to prepare for discharge home.     Time  6    Period  Weeks    Status  New    Target Date  10/14/17      PT LONG TERM GOAL #2   Title  Pt will demo improved Lt ankle plantarflexion strength, evident by her ability to complete 25 single leg heel raises without pain or difficulty, which will improve her pushoff gait mechanics.    Time  6    Period  Weeks    Status  New      PT LONG TERM GOAL #3   Title  Pt will maintain bilateral LE single leg balance on foam pad for atleast 15 sec without significant postural sway or LOB, to decrease risk of falling or reinjury with return to her leisure activity.     Time  6    Period  Weeks    Status  New      PT LONG TERM GOAL #4   Title  Pt will report atleast a 50% improvement in her ankle stability, strength and endurance with return to her recreational activity.    Time  6    Period  Weeks    Status  New      PT LONG TERM GOAL #5   Title  Pt will demo score improvement of atleast 10% on her FOTO to reflect an increase in her functional capabilities.     Time  6    Period  Weeks    Status  New             Plan - 09/02/17 0908    Clinical Impression Statement  Pt is a 56 y.o F referred to OPPT s/p Lt ankle avulsion fracture following a fall outside. She received conservative treatment and wore a boot for 7 weeks which appears to have gone well, reporting her Lt ankle is healed evident by her most recent ultrasound. She demonstrates limitations in Lt ankle mobility into dorsiflexion, inversion and plantarflexion. Her ankle plantarflexion strength is limited and she also demonstrates poor proprioception during single leg activities completed during today's evaluation. She is typically very active and has not returned to her regular walking or aerobics class participation. Pt would benefit from skilled PT to address her  limitations in ROM, strength, endurance, proprioception and facilitate return to her recreational activity without risk of re-injury.     Clinical Presentation  Stable    Clinical Presentation due to:  healed fracture following conservative treatment     Clinical Decision Making  Low    Rehab Potential  Good    PT Frequency  2x / week    PT Duration  6 weeks    PT Treatment/Interventions  ADLs/Self Care Home Management;Cryotherapy;Gait training;Stair training;Functional mobility training;Therapeutic activities;Therapeutic exercise;Patient/family education;Neuromuscular re-education;Balance training;Manual techniques;Taping;Dry needling;Passive range of motion    PT Next Visit Plan  Lt ankle mobility and ROM; ankle proprioception exercise progression as able; manual techniques to address soft tissue restrictions along lateral gastroc region    PT Home Exercise Plan  ankle 4 way with blue TB, BLE heel raises    Consulted and Agree with Plan of Care  Patient       Patient will benefit from skilled therapeutic intervention in order to improve the following deficits and impairments:  Abnormal gait, Decreased activity tolerance, Decreased balance, Difficulty walking, Impaired flexibility, Hypomobility, Decreased strength, Decreased range of motion, Decreased endurance, Hypermobility, Pain  Visit Diagnosis: Unsteadiness on feet - Plan: PT plan of care cert/re-cert, PT plan of care cert/re-cert  Stiffness of left ankle, not elsewhere classified - Plan: PT plan of care cert/re-cert, PT plan of care cert/re-cert  Muscle weakness (generalized) - Plan: PT plan of care cert/re-cert, PT plan of care cert/re-cert     Problem List Patient Active Problem List   Diagnosis Date Noted  . Left ankle injury, subsequent encounter 06/21/2017  . Medication intolerance 07/04/2014  . Family history of early CAD 06/29/2014  . Palpitations 06/29/2014  . Hyperlipidemia   . PVC (premature ventricular contraction)    . Low back pain 06/20/2014  . Right hip pain 02/15/2014  . Right shoulder injury 02/01/2014  . Salmonella 10/27/2013  . Breast density 03/17/2013  . Renal cyst, left 01/06/2013  . Family history of colon cancer 01/03/2013  . Elevated alkaline phosphatase measurement 01/03/2013  . Articular cartilage disorder of knee 12/24/2012  . Plantar fasciitis 07/21/2012  . Anxiety 07/02/2012  . History of anemia 07/01/2012  . Depression 07/01/2012  . Menopausal hot flushes 07/01/2012  . DJD (degenerative joint disease) 07/01/2012  . Vaginal dryness, menopausal 07/01/2012  . Urge incontinence of urine 07/01/2012  . Fibromyalgia   . Family history of malignant neoplasm 12/25/2011  . Personal history of colonic polyps 12/25/2011    9:19 AM,09/02/17 Elly Modena PT, DPT [place] [facility] [address], [place], [place], [address] Phone: [phone]   Fax:  [phone]  Name: KARRY BARRILLEAUX MRN: [id number] Date of Birth: 19-Nov-1960

## 2017-09-03 ENCOUNTER — Ambulatory Visit: Payer: BC Managed Care – PPO | Admitting: Physical Therapy

## 2017-09-03 DIAGNOSIS — M6281 Muscle weakness (generalized): Secondary | ICD-10-CM

## 2017-09-03 DIAGNOSIS — M25672 Stiffness of left ankle, not elsewhere classified: Secondary | ICD-10-CM | POA: Diagnosis not present

## 2017-09-03 DIAGNOSIS — R2681 Unsteadiness on feet: Secondary | ICD-10-CM

## 2017-09-03 NOTE — Therapy (Signed)
[facility] [address], [place], [place], [address] Phone: [phone]   Fax:  [phone]  Physical Therapy Treatment  Patient Details  Name: Megan Combs MRN: [id number] Date of Birth: 1961/06/15 Referring Provider: Karlton Lemon, MD    Encounter Date: 09/03/2017  PT End of Session - 09/03/17 0918    Visit Number  2    Number of Visits  13    Authorization Type  BCBS    Authorization Time Period  09/02/17 to 10/14/17    PT Start Time  0846    PT Stop Time  0936 10 min untimed modalities   10 min untimed modalities   PT Time Calculation (min)  50 min    Activity Tolerance  Patient tolerated treatment well;No increased pain    Behavior During Therapy  WFL for tasks assessed/performed       Past Medical History:  Diagnosis Date  . Allergy   . Anemia   . Anxiety   . Arthritis    right knee  . Degenerative disc disease   . Depression   . Family history of early CAD   . Fibromyalgia   . Fibromyalgia   . Hyperlipidemia   . IBS (irritable bowel syndrome)   . Lumbar radiculitis   . Plantar fasciitis of left foot   . PVC (premature ventricular contraction)   . Salmonella poisoning     Past Surgical History:  Procedure Laterality Date  . COLONOSCOPY    . EYE SURGERY    . KNEE ARTHROSCOPY    . KNEE ARTHROSCOPY    . laproscopy    . TONSILLECTOMY      There were no vitals filed for this visit.  Subjective Assessment - 09/03/17 0851    Subjective  Pt reports that things are going well since her evaluation the other day. She completed her HEP without difficulty, and feels that her ankle feels much more stable already.     Pertinent History  fibromyalgia, HLD, Lumbar radiculitis     Limitations  Other (comment)    How long can you sit comfortably?  unlimited    How long can you stand comfortably?  unlimited    How long can you walk comfortably?  currently only up to 30 min walks    Diagnostic tests   ultrasound: fractue healed     Patient Stated Goals  improve ankle strength and ROM     Currently in Pain?  No/denies         [facility] PT Assessment - 09/03/17 0001      AROM   Left Ankle Dorsiflexion  12    Left Ankle Plantar Flexion  65                  [facility] Adult PT Treatment/Exercise - 09/03/17 0001      Modalities   Modalities  Cryotherapy      Cryotherapy   Number Minutes Cryotherapy  10 Minutes    Cryotherapy Location  Ankle Lt   Lt   Type of Cryotherapy  Ice pack      Manual Therapy   Manual Therapy  Joint mobilization    Joint Mobilization  Lt PF MWM 3x10 reps; Lt ankle DF MWM 3x10 reps      Ankle Exercises: Seated   ABC's  Other (comment);2 reps Lt   Lt   BAPS  Level 2;Sitting;Other (comment) x2 min ant/post, CW, CCW   x2 min ant/post, CW, CCW  PT Education - 09/03/17 [phone]    Education provided  Yes    Education Details  implications for manual techniques completed during the session; expected soreness following manual techniques     Person(s) Educated  Patient    Methods  Explanation    Comprehension  Verbalized understanding       PT Short Term Goals - 09/02/17 0854      PT SHORT TERM GOAL #1   Title  Pt will demo consistency and independence with HEP to improve ankle strength and ROM.     Time  3    Period  Weeks    Status  New    Target Date  09/23/17      PT SHORT TERM GOAL #2   Title  Pt will demo improved bilateral ankle dorsiflexion active ROM to atleast 10 deg which will assist with foot clearance during ambulation.     Time  3    Period  Weeks    Status  New      PT SHORT TERM GOAL #3   Title  Pt will demo improved ankle proprioception evident by her ability to maintain SLS for atleast 15 sec on the Lt without postural sway or LOB, 2/3 trials.     Time  3    Period  Weeks    Status  New        PT Long Term Goals - 09/02/17 0857      PT LONG TERM GOAL #1   Title  Pt will demo understanding and  independence with her advanced HEP to prepare for discharge home.     Time  6    Period  Weeks    Status  New    Target Date  10/14/17      PT LONG TERM GOAL #2   Title  Pt will demo improved Lt ankle plantarflexion strength, evident by her ability to complete 25 single leg heel raises without pain or difficulty, which will improve her pushoff gait mechanics.    Time  6    Period  Weeks    Status  New      PT LONG TERM GOAL #3   Title  Pt will maintain bilateral LE single leg balance on foam pad for atleast 15 sec without significant postural sway or LOB, to decrease risk of falling or reinjury with return to her leisure activity.     Time  6    Period  Weeks    Status  New      PT LONG TERM GOAL #4   Title  Pt will report atleast a 50% improvement in her ankle stability, strength and endurance with return to her recreational activity.    Time  6    Period  Weeks    Status  New      PT LONG TERM GOAL #5   Title  Pt will demo score improvement of atleast 10% on her FOTO to reflect an increase in her functional capabilities.     Time  6    Period  Weeks    Status  New            Plan - 09/03/17 0920    Clinical Impression Statement  Pt arrived today noting improvements in her feelings of ankle stability following completion of her HEP. Completed manual techniques to address ankle DF and PF with noted improvements of 5 deg or greater into plantar flexion. Also incorporated activity to improve neuromuscular  control of the ankle. Pt reported no increase in pain following today's session. Will continue with current POC.     Rehab Potential  Good    PT Frequency  2x / week    PT Duration  6 weeks    PT Treatment/Interventions  ADLs/Self Care Home Management;Cryotherapy;Gait training;Stair training;Functional mobility training;Therapeutic activities;Therapeutic exercise;Patient/family education;Neuromuscular re-education;Balance training;Manual techniques;Taping;Dry needling;Passive  range of motion    PT Next Visit Plan  Lt ankle mobility and ROM; ankle proprioception exercise progression as able; manual techniques to address soft tissue restrictions along lateral gastroc region    PT Home Exercise Plan  ankle 4 way with blue TB, BLE heel raises    Consulted and Agree with Plan of Care  Patient       Patient will benefit from skilled therapeutic intervention in order to improve the following deficits and impairments:  Abnormal gait, Decreased activity tolerance, Decreased balance, Difficulty walking, Impaired flexibility, Hypomobility, Decreased strength, Decreased range of motion, Decreased endurance, Hypermobility, Pain  Visit Diagnosis: Unsteadiness on feet  Stiffness of left ankle, not elsewhere classified  Muscle weakness (generalized)     Problem List Patient Active Problem List   Diagnosis Date Noted  . Left ankle injury, subsequent encounter 06/21/2017  . Medication intolerance 07/04/2014  . Family history of early CAD 06/29/2014  . Palpitations 06/29/2014  . Hyperlipidemia   . PVC (premature ventricular contraction)   . Low back pain 06/20/2014  . Right hip pain 02/15/2014  . Right shoulder injury 02/01/2014  . Salmonella 10/27/2013  . Breast density 03/17/2013  . Renal cyst, left 01/06/2013  . Family history of colon cancer 01/03/2013  . Elevated alkaline phosphatase measurement 01/03/2013  . Articular cartilage disorder of knee 12/24/2012  . Plantar fasciitis 07/21/2012  . Anxiety 07/02/2012  . History of anemia 07/01/2012  . Depression 07/01/2012  . Menopausal hot flushes 07/01/2012  . DJD (degenerative joint disease) 07/01/2012  . Vaginal dryness, menopausal 07/01/2012  . Urge incontinence of urine 07/01/2012  . Fibromyalgia   . Family history of malignant neoplasm 12/25/2011  . Personal history of colonic polyps 12/25/2011   9:48 AM,09/03/17 Elly Modena PT, DPT [place] [facility] [address] [place], [place], [address] Phone: [phone]   Fax:  [phone]  Name: FATISHA RABALAIS MRN: [id number] Date of Birth: [birth year]

## 2017-09-04 ENCOUNTER — Encounter: Payer: Self-pay | Admitting: Cardiology

## 2017-09-04 ENCOUNTER — Ambulatory Visit: Payer: BC Managed Care – PPO | Admitting: Cardiology

## 2017-09-04 VITALS — BP 120/78 | HR 76 | Ht 66.0 in | Wt 168.1 lb

## 2017-09-04 DIAGNOSIS — E785 Hyperlipidemia, unspecified: Secondary | ICD-10-CM

## 2017-09-04 DIAGNOSIS — I493 Ventricular premature depolarization: Secondary | ICD-10-CM

## 2017-09-04 NOTE — Progress Notes (Signed)
09/04/2017 Megan Combs   1961/04/18  [id number]  Primary Physician Coralyn Mark Altamese Cabal, MD Primary Cardiologist: Dr. Radford Pax   Reason for Visit/CC: 1 year f/u for PVCs  HPI:  Megan Combs is a 56 y.o. female who is being seen today for routine 1 year f/u. She has been followed by Dr. Radford Pax. She has a history of DLD that is diet controlled, family h/o CAD and history of palpitations. She wore an ambulatory monitor which showed occasional PVCs. She was also evaluated for CP in June 2017. Exercise Myoview was negative for ischemia. Echocardiogram in 2013 was also normal with normal LVEF and only trivial MR. She was last seen by Dr. Radford Pax 06/2016 and doing well w/o any cardiac symptoms.   She is back today for her yearly f/u. She notes she continues to do well. She denies CP and dyspnea. No exertional symptoms. She also denies palpitations, dizziness, orthopnea, PND and LEE. BP is well controlled today at 120/78. EKG shows NSR. She broke her left ankle back in August and has been a bit inactive with some slight weight gain. She is doing better now and working to increase her activity level.   Current Meds  Medication Sig  . acetic acid-hydrocortisone (VOSOL-HC) otic solution Place 4 drops into both ears daily. Use for one week and then off 3 weeks  . b complex vitamins tablet Take 1 tablet by mouth daily.  Marland Kitchen buPROPion (WELLBUTRIN XL) 300 MG 24 hr tablet Take 300 mg by mouth daily.  . Calcium Carbonate-Vitamin D (CALCIUM 500 + D) 500-125 MG-UNIT TABS Take 1 tablet by mouth daily.  Marland Kitchen estradiol (ESTRACE) 1 MG tablet TAKE 1 TABLET (1 MG TOTAL) BY MOUTH DAILY.  . ferrous sulfate 325 (65 FE) MG tablet Take 325 mg by mouth 3 (three) times a week.   . fish oil-omega-3 fatty acids 1000 MG capsule Take 1 g by mouth daily.  Marland Kitchen glucosamine-chondroitin 500-400 MG tablet Take 3 tablets by mouth daily.  Marland Kitchen lithium carbonate (LITHOBID) 300 MG CR tablet   . Multiple Vitamin (MULITIVITAMIN WITH MINERALS)  TABS Take 1 tablet by mouth daily.  . Probiotic Product (PROBIOTIC DAILY PO) Take 1 Can by mouth daily.  . progesterone (PROMETRIUM) 100 MG capsule Take 1 capsule (100 mg total) by mouth daily.  . sulindac (CLINORIL) 150 MG tablet Take 1 tablet (150 mg total) by mouth 2 (two) times daily.   Allergies  Allergen Reactions  . Colace  [Docusate Calcium] Other (See Comments)    cramping  . Morphine And Related     Other reaction(s): Redness IV  . Other Rash    Medical tape, band aids  . Penicillins Hives and Rash  . Zithromax [Azithromycin] Rash and Hives  . Dulcolax [Bisacodyl]     Dehydration   . Effexor [Venlafaxine]     Intolerant SSRI/SNRI  . Erythromycin Nausea And Vomiting  . Prednisone     Paranoid , anxious, hyper emotionally, could not sleep, odd dreams  . Tape Dermatitis   Past Medical History:  Diagnosis Date  . Allergy   . Anemia   . Anxiety   . Arthritis    right knee  . Degenerative disc disease   . Depression   . Family history of early CAD   . Fibromyalgia   . Fibromyalgia   . Hyperlipidemia   . IBS (irritable bowel syndrome)   . Lumbar radiculitis   . Plantar fasciitis of left foot   . PVC (premature  ventricular contraction)   . Salmonella poisoning    Family History  Problem Relation Age of Onset  . Colon cancer Father   . Esophageal cancer Father   . Cancer Father   . Stroke Father   . Heart disease Father   . Hypertension Father   . Hyperlipidemia Father   . Diabetes Father   . Prostate cancer Father   . Stomach cancer Father   . Heart disease Mother   . Arthritis Mother   . Heart attack Mother   . Mental illness Mother   . Diabetes Maternal Grandfather   . Heart disease Maternal Grandfather   . Mental illness Maternal Grandfather   . Stroke Maternal Grandfather   . Hypertension Brother   . Hyperlipidemia Brother   . Cancer Maternal Grandmother        circulatory cancer  . Heart disease Paternal Grandfather   . Diabetes Paternal  Grandfather   . Pancreatic cancer Neg Hx   . Rectal cancer Neg Hx    Past Surgical History:  Procedure Laterality Date  . COLONOSCOPY    . EYE SURGERY    . KNEE ARTHROSCOPY    . KNEE ARTHROSCOPY    . laproscopy    . TONSILLECTOMY     Social History   Socioeconomic History  . Marital status: Married    Spouse name: Not on file  . Number of children: 0  . Years of education: Not on file  . Highest education level: Not on file  Social Needs  . Financial resource strain: Not on file  . Food insecurity - worry: Not on file  . Food insecurity - inability: Not on file  . Transportation needs - medical: Not on file  . Transportation needs - non-medical: Not on file  Occupational History  . Occupation: Programmer, multimedia: [employer], production: [employer]  Tobacco Use  . Smoking status: Never Smoker  . Smokeless tobacco: Never Used  Substance and Sexual Activity  . Alcohol use: No    Alcohol/week: 0.0 oz  . Drug use: No  . Sexual activity: Yes    Birth control/protection: Post-menopausal  Other Topics Concern  . Not on file  Social History Narrative  . Not on file     Review of Systems: General: negative for chills, fever, night sweats or weight changes.  Cardiovascular: negative for chest pain, dyspnea on exertion, edema, orthopnea, palpitations, paroxysmal nocturnal dyspnea or shortness of breath Dermatological: negative for rash Respiratory: negative for cough or wheezing Urologic: negative for hematuria Abdominal: negative for nausea, vomiting, diarrhea, bright red blood per rectum, melena, or hematemesis Neurologic: negative for visual changes, syncope, or dizziness All other systems reviewed and are otherwise negative except as noted above.   Physical Exam:  Blood pressure 120/78, pulse 76, height 5\' 6"  (1.676 m), weight 168 lb 1.9 oz (76.3 kg), last menstrual period 10/28/2008, SpO2 98 %.  General appearance: alert, cooperative and no distress Neck:  no carotid bruit and no JVD Lungs: clear to auscultation bilaterally Heart: regular rate and rhythm, S1, S2 normal, no murmur, click, rub or gallop Extremities: extremities normal, atraumatic, no cyanosis or edema Pulses: 2+ and symmetric Skin: Skin color, texture, turgor normal. No rashes or lesions Neurologic: Grossly normal  EKG NSR 76 bpm-- personally reviewed   ASSESSMENT AND PLAN:   1. H/o PVCs: Asymptomatic. She has not required any rate control medications. EKG shows NSR. Resting HR is controlled.  2. DLD: last FLP 06/2016 showed LDL at 101, Good HDL at 86, TG at 51. Repeat FLP today. Continue with fish oil.   3. Family H/o CAD: pt had negative exercise Myoview 03/2016. No ischemia.   Pt encouraged to eat a heart healthy diet and to work on increasing physical activity. She will call if any development of cardiac symptoms. Otherwise, return in 1 year for f/u.     Follow-Up w/ Dr. Radford Pax in 1 year   Railynn Ballo Ladoris Gene, MHS [facility] 09/04/2017 8:15 AM

## 2017-09-04 NOTE — Patient Instructions (Addendum)
Your physician has recommended you make the following change in your medication: [place]  Your physician recommends that you return for lab work in:  [place] wants you to follow-up in: [place] [facility]-healthy meal planning includes:  Limiting unhealthy fats.  Increasing healthy fats.  Making other small dietary changes.  You may need to talk with your doctor or a diet specialist (dietitian) to create an eating plan that is right for you. What types of fat should I choose?  Choose healthy fats. These include olive oil and canola oil, flaxseeds, walnuts, almonds, and seeds.  Eat more omega-3 fats. These include salmon, mackerel, sardines, tuna, flaxseed oil, and ground flaxseeds. Try to eat fish at least twice each week.  Limit saturated fats. ? Saturated fats are often found in animal products, such as meats, butter, and cream. ? Plant sources of saturated fats include palm oil, palm kernel oil, and coconut oil.  Avoid foods with partially hydrogenated oils in them. These include stick margarine, some tub margarines, cookies, crackers, and other baked goods. These contain trans fats. What general guidelines do I need to follow?  Check food labels carefully. Identify foods with trans fats or high amounts of saturated fat.  Fill one half of your plate with vegetables and green salads. Eat 4-5 servings of vegetables per day. A serving of vegetables is: ? 1 cup of raw leafy vegetables. ?  cup of raw or cooked cut-up vegetables. ?  cup of vegetable juice.  Fill one fourth of your plate with whole grains. Look for the word "whole" as the first word in the ingredient list.  Fill one fourth of your plate with lean protein foods.  Eat 4-5 servings of fruit per day. A serving of fruit is: ? One medium whole fruit. ?  cup of dried fruit. ?  cup of fresh, frozen, or canned fruit. ?  cup of  100% fruit juice.  Eat more foods that contain soluble fiber. These include apples, broccoli, carrots, beans, peas, and barley. Try to get 20-30 g of fiber per day.  Eat more home-cooked food. Eat less restaurant, buffet, and fast food.  Limit or avoid alcohol.  Limit foods high in starch and sugar.  Avoid fried foods.  Avoid frying your food. Try baking, boiling, grilling, or broiling it instead. You can also reduce fat by: ? Removing the skin from poultry. ? Removing all visible fats from meats. ? Skimming the fat off of stews, soups, and gravies before serving them. ? Steaming vegetables in water or broth.  Lose weight if you are overweight.  Eat 4-5 servings of nuts, legumes, and seeds per week: ? One serving of dried beans or legumes equals  cup after being cooked. ? One serving of nuts equals 1 ounces. ? One serving of seeds equals  ounce or one tablespoon.  You may need to keep track of how much salt or sodium you eat. This is especially true if you have high blood pressure. Talk with your doctor or dietitian to get more information. What foods can I eat? Grains Breads, including [place], white, pita, wheat, raisin, rye, oatmeal, and [place]. Tortillas that are neither fried nor made with lard or trans fat. Low-fat rolls, including hotdog and hamburger buns and English muffins. Biscuits. Muffins. Waffles. Pancakes. Light popcorn. Whole-grain cereals. Flatbread. Melba toast. Pretzels. Breadsticks. Rusks. Low-fat snacks. Low-fat crackers, including oyster, saltine, matzo, graham,  animal, and rye. Rice and pasta, including brown rice and pastas that are made with whole wheat. Vegetables All vegetables. Fruits All fruits, but limit coconut. Meats and Other Protein Sources Lean, well-trimmed beef, veal, pork, and lamb. Chicken and [place] without skin. All fish and shellfish. Wild duck, rabbit, pheasant, and venison. Egg whites or low-cholesterol egg substitutes. Dried beans,  peas, lentils, and tofu. Seeds and most nuts. Dairy Low-fat or nonfat cheeses, including ricotta, string, and mozzarella. Skim or 1% milk that is liquid, powdered, or evaporated. Buttermilk that is made with low-fat milk. Nonfat or low-fat yogurt. Beverages Mineral water. Diet carbonated beverages. Sweets and Desserts Sherbets and fruit ices. Honey, jam, marmalade, jelly, and syrups. Meringues and gelatins. Pure sugar candy, such as hard candy, jelly beans, gumdrops, mints, marshmallows, and small amounts of dark chocolate. [employer] Eat all sweets and desserts in moderation. Fats and Oils Nonhydrogenated (trans-free) margarines. Vegetable oils, including soybean, sesame, sunflower, olive, peanut, safflower, corn, canola, and cottonseed. Salad dressings or mayonnaise made with a vegetable oil. Limit added fats and oils that you use for cooking, baking, salads, and as spreads. Other Cocoa powder. Coffee and tea. All seasonings and condiments. The items listed above may not be a complete list of recommended foods or beverages. Contact your dietitian for more options. What foods are not recommended? Grains Breads that are made with saturated or trans fats, oils, or whole milk. Croissants. Butter rolls. Cheese breads. Sweet rolls. Donuts. Buttered popcorn. Chow mein noodles. High-fat crackers, such as cheese or butter crackers. Meats and Other Protein Sources Fatty meats, such as hotdogs, short ribs, sausage, spareribs, bacon, rib eye roast or steak, and mutton. High-fat deli meats, such as salami and bologna. Caviar. Domestic duck and goose. Organ meats, such as kidney, liver, sweetbreads, and heart. Dairy Cream, sour cream, cream cheese, and creamed cottage cheese. Whole-milk cheeses, including blue (bleu), Monterey Jack, [place], [place], American, [place], Swiss, cheddar, [place], and [place]. Whole or 2% milk that is liquid, evaporated, or condensed. Whole buttermilk. Cream sauce or high-fat  cheese sauce. Yogurt that is made from whole milk. Beverages Regular sodas and juice drinks with added sugar. Sweets and Desserts Frosting. Pudding. Cookies. Cakes other than angel food cake. Candy that has milk chocolate or white chocolate, hydrogenated fat, butter, coconut, or unknown ingredients. Buttered syrups. Full-fat ice cream or ice cream drinks. Fats and Oils Gravy that has suet, meat fat, or shortening. Cocoa butter, hydrogenated oils, palm oil, coconut oil, palm kernel oil. These can often be found in baked products, candy, fried foods, nondairy creamers, and whipped toppings. Solid fats and shortenings, including bacon fat, salt pork, lard, and butter. Nondairy cream substitutes, such as coffee creamers and sour cream substitutes. Salad dressings that are made of unknown oils, cheese, or sour cream. The items listed above may not be a complete list of foods and beverages to avoid. Contact your dietitian for more information. This information is not intended to replace advice given to you by your health care provider. Make sure you discuss any questions you have with your health care provider. Document Released: 04/14/2012 Document Revised: 03/21/2016 Document Reviewed: 04/07/2014 Elsevier Interactive Patient Education  2018 [place] will receive a reminder letter in the mail two months in advance. If you don't receive a letter, please call our office to schedule the follow-up appointment.

## 2017-09-05 ENCOUNTER — Other Ambulatory Visit: Payer: BC Managed Care – PPO | Admitting: *Deleted

## 2017-09-05 DIAGNOSIS — E785 Hyperlipidemia, unspecified: Secondary | ICD-10-CM | POA: Diagnosis not present

## 2017-09-05 LAB — LIPID PANEL
CHOL/HDL RATIO: 2.4 ratio (ref 0.0–4.4)
Cholesterol, Total: 192 mg/dL (ref 100–199)
HDL: 80 mg/dL (ref >39)
LDL CALC: 96 mg/dL (ref 0–99)
TRIGLYCERIDES: 79 mg/dL (ref 0–149)
VLDL CHOLESTEROL CAL: 16 mg/dL (ref 5–40)

## 2017-09-08 ENCOUNTER — Ambulatory Visit: Payer: BC Managed Care – PPO | Admitting: Physical Therapy

## 2017-09-08 DIAGNOSIS — M25672 Stiffness of left ankle, not elsewhere classified: Secondary | ICD-10-CM

## 2017-09-08 DIAGNOSIS — M6281 Muscle weakness (generalized): Secondary | ICD-10-CM

## 2017-09-08 DIAGNOSIS — R2681 Unsteadiness on feet: Secondary | ICD-10-CM

## 2017-09-08 NOTE — Patient Instructions (Signed)
ARCH LIFTS  Start with your foot on the floor. Raise up the arch of your foot while maintaining your big toe, ball of your foot and heel on the floor the entire time.     hold 3 sec, repeat 15x   [facility] [address] [place], [place] [address] Phone # [phone] Fax [phone]

## 2017-09-08 NOTE — Therapy (Signed)
[facility] [address], [place], [place], [address] Phone: [phone]   Fax:  [phone]  Physical Therapy Treatment  Patient Details  Name: Megan Combs MRN: [id number] Date of Birth: [birth year] Referring Provider: Karlton Lemon, MD    Encounter Date: 09/08/2017  PT End of Session - 09/08/17 0900    Visit Number  3    Number of Visits  13    Authorization Type  BC[employer]    Authorization Time Period  09/02/17 to 10/14/17    PT Start Time  0849    PT Stop Time  0928    PT Time Calculation (min)  39 min    Activity Tolerance  Patient tolerated treatment well;No increased pain    Behavior During Therapy  WFL for tasks assessed/performed       Past Medical History:  Diagnosis Date  . Allergy   . Anemia   . Anxiety   . Arthritis    right knee  . Degenerative disc disease   . Depression   . Family history of early CAD   . Fibromyalgia   . Fibromyalgia   . Hyperlipidemia   . IBS (irritable bowel syndrome)   . Lumbar radiculitis   . Plantar fasciitis of left foot   . PVC (premature ventricular contraction)   . Salmonella poisoning     Past Surgical History:  Procedure Laterality Date  . COLONOSCOPY    . EYE SURGERY    . KNEE ARTHROSCOPY    . KNEE ARTHROSCOPY    . laproscopy    . TONSILLECTOMY      There were no vitals filed for this visit.  Subjective Assessment - 09/08/17 0850    Subjective  Pt reports that her ankle was a little sore with her blue band, but she has been able to manage this with her ice application. She has been doing some walking, but not too much. She does feel that her ankle is getting much stronger.     Pertinent History  fibromyalgia, HLD, Lumbar radiculitis     Limitations  Other (comment)    How long can you sit comfortably?  unlimited    How long can you stand comfortably?  unlimited    How long can you walk comfortably?  currently only up to 30 min walks    Diagnostic  tests  ultrasound: fractue healed     Patient Stated Goals  improve ankle strength and ROM     Currently in Pain?  No/denies                      [facility] PT Treatment/Exercise - 09/08/17 0001      Manual Therapy   Joint Mobilization  Lt distal fibular AP mobilization, grade III/IV x3 bouts, MWM ankle DF during sustained AP glide of distal fibula       Ankle Exercises: Seated   BAPS  Level 2;Sitting;Other (comment) x60 sec ant/post; CW; CCW LLE only x60 sec     Other Seated Ankle Exercises  arch squeeze x3 sec hold, 15 reps     Other Seated Ankle Exercises  sit to stand on foam 2x10 reps, green TB around knees 2nd set for improved valgus control.       Ankle Exercises: Supine   Other Supine Ankle Exercises  BLE bridge with feet on dyna disc x10 reps, x10 reps without excessive MTP extension      Ankle Exercises: Standing  Heel Raises  10 reps    Heel Raises Limitations  LLE only             PT Education - 09/08/17 0856    Education provided  Yes    Education Details  discussed issues with HEP and recommended decrease in resistance to allow  for gradual progression of strength without increase in soreness    Person(s) Educated  Patient    Methods  Explanation    Comprehension  Verbalized understanding       PT Short Term Goals - 09/02/17 0854      PT SHORT TERM GOAL #1   Title  Pt will demo consistency and independence with HEP to improve ankle strength and ROM.     Time  3    Period  Weeks    Status  New    Target Date  09/23/17      PT SHORT TERM GOAL #2   Title  Pt will demo improved bilateral ankle dorsiflexion active ROM to atleast 10 deg which will assist with foot clearance during ambulation.     Time  3    Period  Weeks    Status  New      PT SHORT TERM GOAL #3   Title  Pt will demo improved ankle proprioception evident by her ability to maintain SLS for atleast 15 sec on the Lt without postural sway or LOB, 2/3 trials.     Time  3     Period  Weeks    Status  New        PT Long Term Goals - 09/02/17 0857      PT LONG TERM GOAL #1   Title  Pt will demo understanding and independence with her advanced HEP to prepare for discharge home.     Time  6    Period  Weeks    Status  New    Target Date  10/14/17      PT LONG TERM GOAL #2   Title  Pt will demo improved Lt ankle plantarflexion strength, evident by her ability to complete 25 single leg heel raises without pain or difficulty, which will improve her pushoff gait mechanics.    Time  6    Period  Weeks    Status  New      PT LONG TERM GOAL #3   Title  Pt will maintain bilateral LE single leg balance on foam pad for atleast 15 sec without significant postural sway or LOB, to decrease risk of falling or reinjury with return to her leisure activity.     Time  6    Period  Weeks    Status  New      PT LONG TERM GOAL #4   Title  Pt will report atleast a 50% improvement in her ankle stability, strength and endurance with return to her recreational activity.    Time  6    Period  Weeks    Status  New      PT LONG TERM GOAL #5   Title  Pt will demo score improvement of atleast 10% on her FOTO to reflect an increase in her functional capabilities.     Time  6    Period  Weeks    Status  New            Plan - 09/08/17 0929    Clinical Impression Statement  Pt continues to report steady progress and improved Lt  ankle strength with HEP completion at home. Session focused on progression of ankle strength, proprioception and endurance with addition of standing heel raises. Pt did have increased difficulty with this, only able to complete ~10 reps without significant difficulty. Pt does demonstrate up to 10 deg of active ankle dorsiflexion maintained from last session. Will continue with current POC.     Rehab Potential  Good    PT Frequency  2x / week    PT Duration  6 weeks    PT Treatment/Interventions  ADLs/Self Care Home Management;Cryotherapy;Gait  training;Stair training;Functional mobility training;Therapeutic activities;Therapeutic exercise;Patient/family education;Neuromuscular re-education;Balance training;Manual techniques;Taping;Dry needling;Passive range of motion    PT Next Visit Plan  Lt ankle mobility and ROM; ankle proprioception exercise progression as able; manual techniques to address soft tissue restrictions along lateral gastroc region    PT Home Exercise Plan  ankle 4 way with blue TB, BLE heel raises, arch lifts     Consulted and Agree with Plan of Care  Patient       Patient will benefit from skilled therapeutic intervention in order to improve the following deficits and impairments:  Abnormal gait, Decreased activity tolerance, Decreased balance, Difficulty walking, Impaired flexibility, Hypomobility, Decreased strength, Decreased range of motion, Decreased endurance, Hypermobility, Pain  Visit Diagnosis: Unsteadiness on feet  Stiffness of left ankle, not elsewhere classified  Muscle weakness (generalized)     Problem List Patient Active Problem List   Diagnosis Date Noted  . Left ankle injury, subsequent encounter 06/21/2017  . Medication intolerance 07/04/2014  . Family history of early CAD 06/29/2014  . Palpitations 06/29/2014  . Hyperlipidemia   . PVC (premature ventricular contraction)   . Low back pain 06/20/2014  . Right hip pain 02/15/2014  . Right shoulder injury 02/01/2014  . Salmonella 10/27/2013  . Breast density 03/17/2013  . Renal cyst, left 01/06/2013  . Family history of colon cancer 01/03/2013  . Elevated alkaline phosphatase measurement 01/03/2013  . Articular cartilage disorder of knee 12/24/2012  . Plantar fasciitis 07/21/2012  . Anxiety 07/02/2012  . History of anemia 07/01/2012  . Depression 07/01/2012  . Menopausal hot flushes 07/01/2012  . DJD (degenerative joint disease) 07/01/2012  . Vaginal dryness, menopausal 07/01/2012  . Urge incontinence of urine 07/01/2012  .  Fibromyalgia   . Family history of malignant neoplasm 12/25/2011  . Personal history of colonic polyps 12/25/2011    10:05 AM,09/08/17 Elly Modena PT, DPT [facility] [address], [place], [place], [address] Phone: [phone]   Fax:  [phone]  Name: Megan Combs MRN: [id number] Date of Birth: 01/18/1961

## 2017-09-10 ENCOUNTER — Encounter: Payer: BC Managed Care – PPO | Admitting: Physical Therapy

## 2017-09-11 ENCOUNTER — Ambulatory Visit (INDEPENDENT_AMBULATORY_CARE_PROVIDER_SITE_OTHER): Payer: BC Managed Care – PPO | Admitting: Physical Therapy

## 2017-09-11 DIAGNOSIS — R2681 Unsteadiness on feet: Secondary | ICD-10-CM

## 2017-09-11 DIAGNOSIS — M6281 Muscle weakness (generalized): Secondary | ICD-10-CM | POA: Diagnosis not present

## 2017-09-11 DIAGNOSIS — M25672 Stiffness of left ankle, not elsewhere classified: Secondary | ICD-10-CM | POA: Diagnosis not present

## 2017-09-11 NOTE — Therapy (Signed)
[facility] [address] [place], [place], [address] Phone: [phone]   Fax:  [phone]  Physical Therapy Treatment  Patient Details  Name: Megan Combs MRN: [id number] Date of Birth: 01-08-1961 Referring Provider: Karlton Lemon, MD    Encounter Date: 09/11/2017  PT End of Session - 09/11/17 0817    Visit Number  4    Number of Visits  13    Authorization Type  BCBS    Authorization Time Period  09/02/17 to 10/14/17    PT Start Time  0802    PT Stop Time  9323    PT Time Calculation (min)  55 min    Activity Tolerance  Patient tolerated treatment well;No increased pain    Behavior During Therapy  WFL for tasks assessed/performed       Past Medical History:  Diagnosis Date  . Allergy   . Anemia   . Anxiety   . Arthritis    right knee  . Degenerative disc disease   . Depression   . Family history of early CAD   . Fibromyalgia   . Fibromyalgia   . Hyperlipidemia   . IBS (irritable bowel syndrome)   . Lumbar radiculitis   . Plantar fasciitis of left foot   . PVC (premature ventricular contraction)   . Salmonella poisoning     Past Surgical History:  Procedure Laterality Date  . COLONOSCOPY    . EYE SURGERY    . KNEE ARTHROSCOPY    . KNEE ARTHROSCOPY    . laproscopy    . TONSILLECTOMY      There were no vitals filed for this visit.  Subjective Assessment - 09/11/17 0805    Subjective  Pt reports that the day following her last session she had some swelling with alot of getting up and moving around. She was able to ice/elevate and now things are back to normal. She has been able to complete her other exercises without any issue.     Pertinent History  fibromyalgia, HLD, Lumbar radiculitis     Limitations  Other (comment)    How long can you sit comfortably?  unlimited    How long can you stand comfortably?  unlimited    How long can you walk comfortably?  currently only up to 30 min walks     Diagnostic tests  ultrasound: fractue healed     Patient Stated Goals  improve ankle strength and ROM     Currently in Pain?  No/denies just minor soreness lateral Lt ankle                       [facility] Adult PT Treatment/Exercise - 09/11/17 0001      Modalities   Modalities  Vasopneumatic      Vasopneumatic   Number Minutes Vasopneumatic   15 minutes    Vasopnuematic Location   Ankle    Vasopneumatic Pressure  Medium      Manual Therapy   Joint Mobilization  Grade 3 AP talocrural jt mob, Gade 3 AP distal fibular mobilization      Ankle Exercises: Seated   BAPS  Level 2;Other (comment) x60 sec CW, CCW, ant/post, inv/eversion      Ankle Exercises: Standing   Other Standing Ankle Exercises  NBOS on foam, EC 3x30 sec; tandem each LE forward on firm surface 2x30 sec EO and EC  PT Education - 09/11/17 [phone]    Education provided  Yes    Education Details  discussed possibility of increased soreness/swelling with new activities; encouraged pt to elevate Lt LE throughout the day if she plans on doing alot of sitting or desk work     [employer]) Educated  Patient    Methods  Explanation    Comprehension  Verbalized understanding       PT Short Term Goals - 09/11/17 0900      PT SHORT TERM GOAL #1   Title  Pt will demo consistency and independence with HEP to improve ankle strength and ROM.     Time  3    Period  Weeks    Status  Achieved      PT SHORT TERM GOAL #2   Title  Pt will demo improved bilateral ankle dorsiflexion active ROM to atleast 10 deg which will assist with foot clearance during ambulation.     Time  3    Period  Weeks    Status  Achieved      PT SHORT TERM GOAL #3   Title  Pt will demo improved ankle proprioception evident by her ability to maintain SLS for atleast 15 sec on the Lt without postural sway or LOB, 2/3 trials.     Time  3    Period  Weeks    Status  On-going        PT Long Term Goals - 09/02/17 0857       PT LONG TERM GOAL #1   Title  Pt will demo understanding and independence with her advanced HEP to prepare for discharge home.     Time  6    Period  Weeks    Status  New    Target Date  10/14/17      PT LONG TERM GOAL #2   Title  Pt will demo improved Lt ankle plantarflexion strength, evident by her ability to complete 25 single leg heel raises without pain or difficulty, which will improve her pushoff gait mechanics.    Time  6    Period  Weeks    Status  New      PT LONG TERM GOAL #3   Title  Pt will maintain bilateral LE single leg balance on foam pad for atleast 15 sec without significant postural sway or LOB, to decrease risk of falling or reinjury with return to her leisure activity.     Time  6    Period  Weeks    Status  New      PT LONG TERM GOAL #4   Title  Pt will report atleast a 50% improvement in her ankle stability, strength and endurance with return to her recreational activity.    Time  6    Period  Weeks    Status  New      PT LONG TERM GOAL #5   Title  Pt will demo score improvement of atleast 10% on her FOTO to reflect an increase in her functional capabilities.     Time  6    Period  Weeks    Status  New            Plan - 09/11/17 0845    Clinical Impression Statement  Pt arrived with questions and concerns regarding increased soreness in her ankle with alot of sitting activity a couple of days ago. Therapist discussed possible side effects of increased activity in therapy in addition  to prolonged sitting which allows fluid to collect in the ankle, and encouraged her to elevate/ice more frequently as needed. Continued this session with gentle ankle mobilizations and proprioception activity, noting increased difficulty during eyes closed activity specifically. Pt reported muscle fatigue but no increase in pain following today's session. Will continue with current POC.    Rehab Potential  Good    PT Frequency  2x / week    PT Duration  6 weeks    PT  Treatment/Interventions  ADLs/Self Care Home Management;Cryotherapy;Gait training;Stair training;Functional mobility training;Therapeutic activities;Therapeutic exercise;Patient/family education;Neuromuscular re-education;Balance training;Manual techniques;Taping;Dry needling;Passive range of motion    PT Next Visit Plan  Lt ankle mobility and ROM; ankle proprioception exercise progression as able; manual techniques to address soft tissue restrictions along lateral gastroc region    PT Home Exercise Plan  ankle 4 way with blue TB, BLE heel raises, arch lifts     Consulted and Agree with Plan of Care  Patient       Patient will benefit from skilled therapeutic intervention in order to improve the following deficits and impairments:  Abnormal gait, Decreased activity tolerance, Decreased balance, Difficulty walking, Impaired flexibility, Hypomobility, Decreased strength, Decreased range of motion, Decreased endurance, Hypermobility, Pain  Visit Diagnosis: Unsteadiness on feet  Stiffness of left ankle, not elsewhere classified  Muscle weakness (generalized)     Problem List Patient Active Problem List   Diagnosis Date Noted  . Left ankle injury, subsequent encounter 06/21/2017  . Medication intolerance 07/04/2014  . Family history of early CAD 06/29/2014  . Palpitations 06/29/2014  . Hyperlipidemia   . PVC (premature ventricular contraction)   . Low back pain 06/20/2014  . Right hip pain 02/15/2014  . Right shoulder injury 02/01/2014  . Salmonella 10/27/2013  . Breast density 03/17/2013  . Renal cyst, left 01/06/2013  . Family history of colon cancer 01/03/2013  . Elevated alkaline phosphatase measurement 01/03/2013  . Articular cartilage disorder of knee 12/24/2012  . Plantar fasciitis 07/21/2012  . Anxiety 07/02/2012  . History of anemia 07/01/2012  . Depression 07/01/2012  . Menopausal hot flushes 07/01/2012  . DJD (degenerative joint disease) 07/01/2012  . Vaginal dryness,  menopausal 07/01/2012  . Urge incontinence of urine 07/01/2012  . Fibromyalgia   . Family history of malignant neoplasm 12/25/2011  . Personal history of colonic polyps 12/25/2011    9:01 AM,09/11/17 Elly Modena PT, DPT [place] [facility] [address], [place], [place], [address] Phone: [phone]   Fax:  [phone]  Name: RENELL COAXUM MRN: [id number] Date of Birth: 08-Apr-1961

## 2017-09-12 MED FILL — LITHIUM CARBONATE ER 300 MG: 300 | 30 days supply | Qty: 30 | Fill #2

## 2017-09-12 MED FILL — SULINDAC 150 MG TAB: 150 | 30 days supply | Qty: 60 | Fill #1

## 2017-09-15 DIAGNOSIS — Z79899 Other long term (current) drug therapy: Secondary | ICD-10-CM | POA: Diagnosis not present

## 2017-09-16 ENCOUNTER — Ambulatory Visit: Payer: BC Managed Care – PPO | Admitting: Physical Therapy

## 2017-09-16 DIAGNOSIS — M25672 Stiffness of left ankle, not elsewhere classified: Secondary | ICD-10-CM | POA: Diagnosis not present

## 2017-09-16 DIAGNOSIS — R2681 Unsteadiness on feet: Secondary | ICD-10-CM | POA: Diagnosis not present

## 2017-09-16 DIAGNOSIS — M6281 Muscle weakness (generalized): Secondary | ICD-10-CM

## 2017-09-16 NOTE — Patient Instructions (Signed)
  TANDEM STANCE WITH SUPPORT  Stand in front of a chair, table or counter top for support. Then place the heel of one foot so that it is touching the toes of the other foot. Maintain your balance in this position.     hold 30 sec, repeat 2x    SINGLE LEG STANCE - SLS  Stand on one leg and maintain your balance.  x30 sec, repeat 2x each   [facility] [address], [place], [place] [address] Phone # [phone] Fax [phone]

## 2017-09-16 NOTE — Therapy (Signed)
[facility] [address] [place], [place], [address] Phone: [phone]   Fax:  [phone]  Physical Therapy Treatment  Patient Details  Name: Megan Combs MRN: [id number] Date of Birth: Apr 22, 1961 Referring Provider: Karlton Lemon, MD    Encounter Date: 09/16/2017  PT End of Session - 09/16/17 0928    Visit Number  5    Number of Visits  13    Authorization Type  BCBS    Authorization Time Period  09/02/17 to 10/14/17    PT Start Time  0847    PT Stop Time  0940 15 min untimed    PT Time Calculation (min)  53 min    Activity Tolerance  Patient tolerated treatment well;No increased pain    Behavior During Therapy  WFL for tasks assessed/performed       Past Medical History:  Diagnosis Date  . Allergy   . Anemia   . Anxiety   . Arthritis    right knee  . Degenerative disc disease   . Depression   . Family history of early CAD   . Fibromyalgia   . Fibromyalgia   . Hyperlipidemia   . IBS (irritable bowel syndrome)   . Lumbar radiculitis   . Plantar fasciitis of left foot   . PVC (premature ventricular contraction)   . Salmonella poisoning     Past Surgical History:  Procedure Laterality Date  . COLONOSCOPY    . EYE SURGERY    . KNEE ARTHROSCOPY    . KNEE ARTHROSCOPY    . laproscopy    . TONSILLECTOMY      There were no vitals filed for this visit.  Subjective Assessment - 09/16/17 0850    Subjective  Pt reports that she did a chair exercise class over the past couple of days which was nice. She did have a little bit of soreness following the class, but when she soaked it in the salt bath this seemed to help.     Pertinent History  fibromyalgia, HLD, Lumbar radiculitis     Limitations  Other (comment)    How long can you sit comfortably?  unlimited    How long can you stand comfortably?  unlimited    How long can you walk comfortably?  currently only up to 30 min walks    Diagnostic tests   ultrasound: fractue healed     Patient Stated Goals  improve ankle strength and ROM     Currently in Pain?  No/denies                      [facility] Adult PT Treatment/Exercise - 09/16/17 0001      Manual Therapy   Joint Mobilization  Grade 3 AP talocrural jt mob, Gade 3 AP distal fibular mobilization      Ankle Exercises: Seated   Heel Raises  15 reps;Limitations    Heel Raises Limitations  yellow TB pull medially 2x15 reps     BAPS  Level 3;Sitting;Limitations    BAPS Limitations  CW, CCW x62min     Other Seated Ankle Exercises  sit to stand with LE on foam, 2x10 reps, 1st set with yellow band and band was removed for 2nd set       Ankle Exercises: Standing   Toe Walk (Round Trip)  x1 RT across carpey     Other Standing Ankle Exercises  tandem each LE forward x30 sec each  Other Standing Ankle Exercises  tandem walk x1 RT across carpet       Ankle Exercises: Supine   T-Band  blue TB Lt ankle PF x20 reps              PT Education - 09/16/17 0921    Education provided  Yes    Education Details  discussed progressions to HEP; encouraged pt to continue with light exercise classes and aerobic activity; made additions to pt's HEP    Person(s) Educated  Patient    Methods  Explanation;Handout    Comprehension  Verbalized understanding;Returned demonstration       PT Short Term Goals - 09/11/17 0900      PT SHORT TERM GOAL #1   Title  Pt will demo consistency and independence with HEP to improve ankle strength and ROM.     Time  3    Period  Weeks    Status  Achieved      PT SHORT TERM GOAL #2   Title  Pt will demo improved bilateral ankle dorsiflexion active ROM to atleast 10 deg which will assist with foot clearance during ambulation.     Time  3    Period  Weeks    Status  Achieved      PT SHORT TERM GOAL #3   Title  Pt will demo improved ankle proprioception evident by her ability to maintain SLS for atleast 15 sec on the Lt without postural sway or  LOB, 2/3 trials.     Time  3    Period  Weeks    Status  On-going        PT Long Term Goals - 09/02/17 0857      PT LONG TERM GOAL #1   Title  Pt will demo understanding and independence with her advanced HEP to prepare for discharge home.     Time  6    Period  Weeks    Status  New    Target Date  10/14/17      PT LONG TERM GOAL #2   Title  Pt will demo improved Lt ankle plantarflexion strength, evident by her ability to complete 25 single leg heel raises without pain or difficulty, which will improve her pushoff gait mechanics.    Time  6    Period  Weeks    Status  New      PT LONG TERM GOAL #3   Title  Pt will maintain bilateral LE single leg balance on foam pad for atleast 15 sec without significant postural sway or LOB, to decrease risk of falling or reinjury with return to her leisure activity.     Time  6    Period  Weeks    Status  New      PT LONG TERM GOAL #4   Title  Pt will report atleast a 50% improvement in her ankle stability, strength and endurance with return to her recreational activity.    Time  6    Period  Weeks    Status  New      PT LONG TERM GOAL #5   Title  Pt will demo score improvement of atleast 10% on her FOTO to reflect an increase in her functional capabilities.     Time  6    Period  Weeks    Status  New            Plan - 09/16/17 1478    Clinical Impression  Statement  Continued this session with proprioceptive activty and LE strengthening to improve pt's activity tolerance. Pt continues to make progress, demonstrating an increase in her ability to maintain tandem stance up to 30 sec without LOB or unsteadiness. She continues to demonstrate gastroc weakness on the Lt which therapist made some adjustments to her HEP in order to further address this. Pt reported no increase in pain during or following today's session. Will continue with current POC.     Rehab Potential  Good    PT Frequency  2x / week    PT Duration  6 weeks    PT  Treatment/Interventions  ADLs/Self Care Home Management;Cryotherapy;Gait training;Stair training;Functional mobility training;Therapeutic activities;Therapeutic exercise;Patient/family education;Neuromuscular re-education;Balance training;Manual techniques;Taping;Dry needling;Passive range of motion    PT Next Visit Plan  Lt ankle mobility and ROM (follow up on blue band at home); trial BAPS standing; manual techniques as needed    PT Home Exercise Plan  ankle 4 way with blue TB, BLE heel raises, arch lifts, tandem and SLS     Recommended Other Services  MD signed orders     Consulted and Agree with Plan of Care  Patient       Patient will benefit from skilled therapeutic intervention in order to improve the following deficits and impairments:  Abnormal gait, Decreased activity tolerance, Decreased balance, Difficulty walking, Impaired flexibility, Hypomobility, Decreased strength, Decreased range of motion, Decreased endurance, Hypermobility, Pain  Visit Diagnosis: Unsteadiness on feet  Stiffness of left ankle, not elsewhere classified  Muscle weakness (generalized)     Problem List Patient Active Problem List   Diagnosis Date Noted  . Left ankle injury, subsequent encounter 06/21/2017  . Medication intolerance 07/04/2014  . Family history of early CAD 06/29/2014  . Palpitations 06/29/2014  . Hyperlipidemia   . PVC (premature ventricular contraction)   . Low back pain 06/20/2014  . Right hip pain 02/15/2014  . Right shoulder injury 02/01/2014  . Salmonella 10/27/2013  . Breast density 03/17/2013  . Renal cyst, left 01/06/2013  . Family history of colon cancer 01/03/2013  . Elevated alkaline phosphatase measurement 01/03/2013  . Articular cartilage disorder of knee 12/24/2012  . Plantar fasciitis 07/21/2012  . Anxiety 07/02/2012  . History of anemia 07/01/2012  . Depression 07/01/2012  . Menopausal hot flushes 07/01/2012  . DJD (degenerative joint disease) 07/01/2012  .  Vaginal dryness, menopausal 07/01/2012  . Urge incontinence of urine 07/01/2012  . Fibromyalgia   . Family history of malignant neoplasm 12/25/2011  . Personal history of colonic polyps 12/25/2011    9:42 AM,09/16/17 Megan Combs PT, DPT [place] [facility] [address], [place], [place], [address] Phone: [phone]   Fax:  [phone]  Name: Megan Combs MRN: [id number] Date of Birth: [birth year]

## 2017-09-22 ENCOUNTER — Ambulatory Visit: Payer: BC Managed Care – PPO | Admitting: Physical Therapy

## 2017-09-22 DIAGNOSIS — M6281 Muscle weakness (generalized): Secondary | ICD-10-CM

## 2017-09-22 DIAGNOSIS — F4321 Adjustment disorder with depressed mood: Secondary | ICD-10-CM | POA: Diagnosis not present

## 2017-09-22 DIAGNOSIS — M25672 Stiffness of left ankle, not elsewhere classified: Secondary | ICD-10-CM | POA: Diagnosis not present

## 2017-09-22 DIAGNOSIS — R2681 Unsteadiness on feet: Secondary | ICD-10-CM

## 2017-09-22 NOTE — Therapy (Signed)
[facility] [address], [place], [place], [address] Phone: [phone]   Fax:  [phone]  Physical Therapy Treatment  Patient Details  Name: Megan Combs MRN: [id number] Date of Birth: [birth year] Referring Provider: Karlton Lemon, MD    Encounter Date: 09/22/2017  PT End of Session - 09/22/17 1141    Visit Number  6    Number of Visits  13    Authorization Type  BCBS    Authorization Time Period  09/02/17 to 10/14/17    PT Start Time  1020    PT Stop Time  1115    PT Time Calculation (min)  55 min    Activity Tolerance  Patient tolerated treatment well;No increased pain    Behavior During Therapy  WFL for tasks assessed/performed       Past Medical History:  Diagnosis Date  . Allergy   . Anemia   . Anxiety   . Arthritis    right knee  . Degenerative disc disease   . Depression   . Family history of early CAD   . Fibromyalgia   . Fibromyalgia   . Hyperlipidemia   . IBS (irritable bowel syndrome)   . Lumbar radiculitis   . Plantar fasciitis of left foot   . PVC (premature ventricular contraction)   . Salmonella poisoning     Past Surgical History:  Procedure Laterality Date  . COLONOSCOPY    . EYE SURGERY    . KNEE ARTHROSCOPY    . KNEE ARTHROSCOPY    . laproscopy    . TONSILLECTOMY      There were no vitals filed for this visit.  Subjective Assessment - 09/22/17 1021    Subjective  Pt reports that things are going well. She has a little soreness currently, but only a 1/10. She feels that she is getting stronger and was able to upgrade to her blue band.     Pertinent History  fibromyalgia, HLD, Lumbar radiculitis     Limitations  Other (comment)    How long can you sit comfortably?  unlimited    How long can you stand comfortably?  unlimited    How long can you walk comfortably?  currently only up to 30 min walks    Diagnostic tests  ultrasound: fractue healed     Patient Stated Goals   improve ankle strength and ROM     Currently in Pain?  Yes    Pain Score  1     Pain Location  Ankle    Pain Orientation  Left;Lateral    Pain Descriptors / Indicators  Aching    Pain Type  Acute pain    Pain Radiating Towards  none     Pain Onset  More than a month ago    Pain Frequency  Intermittent    Aggravating Factors   alot of activity     Pain Relieving Factors  exercise    Effect of Pain on Daily Activities  mild                       [facility] Adult PT Treatment/Exercise - 09/22/17 0001      Vasopneumatic   Number Minutes Vasopneumatic   15 minutes    Vasopnuematic Location   Ankle    Vasopneumatic Pressure  Medium      Manual Therapy   Joint Mobilization  Lt fibular MWM during inversion AAROM 3x10 reps  Ankle Exercises: Seated   Towel Crunch  3 reps    Heel Raises  15 reps;Limitations    Heel Raises Limitations  red TB pull medially 2x15 reps     Other Seated Ankle Exercises  Rolling to Lt arch x1 min     Other Seated Ankle Exercises  B ankle eversion x20 reps, red TB       Ankle Exercises: Standing   BAPS  Level 3;Standing;Other (comment) CW/CCW x1 min each     Heel Raises  20 reps;Limitations    Heel Raises Limitations  BLE concentric, LLE eccentric     Other Standing Ankle Exercises  arch squeeze 20x3 sec hold, BLE             PT Education - 09/22/17 1141    Education provided  Yes    Education Details  adjustments to pt's HEP and progression    Person(s) Educated  Patient    Methods  Explanation    Comprehension  Verbalized understanding       PT Short Term Goals - 09/11/17 0900      PT SHORT TERM GOAL #1   Title  Pt will demo consistency and independence with HEP to improve ankle strength and ROM.     Time  3    Period  Weeks    Status  Achieved      PT SHORT TERM GOAL #2   Title  Pt will demo improved bilateral ankle dorsiflexion active ROM to atleast 10 deg which will assist with foot clearance during ambulation.      Time  3    Period  Weeks    Status  Achieved      PT SHORT TERM GOAL #3   Title  Pt will demo improved ankle proprioception evident by her ability to maintain SLS for atleast 15 sec on the Lt without postural sway or LOB, 2/3 trials.     Time  3    Period  Weeks    Status  On-going        PT Long Term Goals - 09/02/17 0857      PT LONG TERM GOAL #1   Title  Pt will demo understanding and independence with her advanced HEP to prepare for discharge home.     Time  6    Period  Weeks    Status  New    Target Date  10/14/17      PT LONG TERM GOAL #2   Title  Pt will demo improved Lt ankle plantarflexion strength, evident by her ability to complete 25 single leg heel raises without pain or difficulty, which will improve her pushoff gait mechanics.    Time  6    Period  Weeks    Status  New      PT LONG TERM GOAL #3   Title  Pt will maintain bilateral LE single leg balance on foam pad for atleast 15 sec without significant postural sway or LOB, to decrease risk of falling or reinjury with return to her leisure activity.     Time  6    Period  Weeks    Status  New      PT LONG TERM GOAL #4   Title  Pt will report atleast a 50% improvement in her ankle stability, strength and endurance with return to her recreational activity.    Time  6    Period  Weeks    Status  New  PT LONG TERM GOAL #5   Title  Pt will demo score improvement of atleast 10% on her FOTO to reflect an increase in her functional capabilities.     Time  6    Period  Weeks    Status  New            Plan - 09/22/17 1139    Clinical Impression Statement  Pt continues to make progress towards her goals, reporting improved ankle strength and stability throughout the day. She is now able to progress to blue theraband resistance without difficulty and was able to perform standing BAPS with minor difficulty. Therapist continues to complete manual techniques to address fibular mobility. Pt will continue to  benefit from skilled PT to improve proprioception, strength and mobility.     Rehab Potential  Good    PT Frequency  2x / week    PT Duration  6 weeks    PT Treatment/Interventions  ADLs/Self Care Home Management;Cryotherapy;Gait training;Stair training;Functional mobility training;Therapeutic activities;Therapeutic exercise;Patient/family education;Neuromuscular re-education;Balance training;Manual techniques;Taping;Dry needling;Passive range of motion    PT Next Visit Plan  Lt ankle mobility and ROM (follow up on blue band at home); trial BAPS standing; manual techniques as needed    PT Home Exercise Plan  ankle 4 way with blue TB, BLE heel raises, arch lifts, tandem and SLS     Consulted and Agree with Plan of Care  Patient       Patient will benefit from skilled therapeutic intervention in order to improve the following deficits and impairments:  Abnormal gait, Decreased activity tolerance, Decreased balance, Difficulty walking, Impaired flexibility, Hypomobility, Decreased strength, Decreased range of motion, Decreased endurance, Hypermobility, Pain  Visit Diagnosis: Unsteadiness on feet  Stiffness of left ankle, not elsewhere classified  Muscle weakness (generalized)     Problem List Patient Active Problem List   Diagnosis Date Noted  . Left ankle injury, subsequent encounter 06/21/2017  . Medication intolerance 07/04/2014  . Family history of early CAD 06/29/2014  . Palpitations 06/29/2014  . Hyperlipidemia   . PVC (premature ventricular contraction)   . Low back pain 06/20/2014  . Right hip pain 02/15/2014  . Right shoulder injury 02/01/2014  . Salmonella 10/27/2013  . Breast density 03/17/2013  . Renal cyst, left 01/06/2013  . Family history of colon cancer 01/03/2013  . Elevated alkaline phosphatase measurement 01/03/2013  . Articular cartilage disorder of knee 12/24/2012  . Plantar fasciitis 07/21/2012  . Anxiety 07/02/2012  . History of anemia 07/01/2012  .  Depression 07/01/2012  . Menopausal hot flushes 07/01/2012  . DJD (degenerative joint disease) 07/01/2012  . Vaginal dryness, menopausal 07/01/2012  . Urge incontinence of urine 07/01/2012  . Fibromyalgia   . Family history of malignant neoplasm 12/25/2011  . Personal history of colonic polyps 12/25/2011    11:44 AM,09/22/17 Elly Modena PT, DPT [facility] [address], [place], [place], [address] Phone: [phone]   Fax:  [phone]  Name: Megan Combs MRN: [id number] Date of Birth: [birth year]

## 2017-09-25 ENCOUNTER — Ambulatory Visit: Payer: BC Managed Care – PPO | Admitting: Physical Therapy

## 2017-09-25 DIAGNOSIS — R2681 Unsteadiness on feet: Secondary | ICD-10-CM

## 2017-09-25 DIAGNOSIS — M25672 Stiffness of left ankle, not elsewhere classified: Secondary | ICD-10-CM | POA: Diagnosis not present

## 2017-09-25 DIAGNOSIS — M6281 Muscle weakness (generalized): Secondary | ICD-10-CM

## 2017-09-25 NOTE — Therapy (Signed)
[facility] [address], [place], [place], [address] Phone: [phone]   Fax:  [phone]  Physical Therapy Treatment  Patient Details  Name: Megan Combs MRN: [id number] Date of Birth: 15-Jan-1961 Referring Provider: Karlton Lemon, MD    Encounter Date: 09/25/2017  PT End of Session - 09/25/17 1022    Visit Number  7    Number of Visits  13    Authorization Type  BCBS    Authorization Time Period  09/02/17 to 10/14/17    PT Start Time  0940    PT Stop Time  1035 15 min untimed modality     PT Time Calculation (min)  55 min    Activity Tolerance  Patient tolerated treatment well;No increased pain    Behavior During Therapy  WFL for tasks assessed/performed       Past Medical History:  Diagnosis Date  . Allergy   . Anemia   . Anxiety   . Arthritis    right knee  . Degenerative disc disease   . Depression   . Family history of early CAD   . Fibromyalgia   . Fibromyalgia   . Hyperlipidemia   . IBS (irritable bowel syndrome)   . Lumbar radiculitis   . Plantar fasciitis of left foot   . PVC (premature ventricular contraction)   . Salmonella poisoning     Past Surgical History:  Procedure Laterality Date  . COLONOSCOPY    . EYE SURGERY    . KNEE ARTHROSCOPY    . KNEE ARTHROSCOPY    . laproscopy    . TONSILLECTOMY      There were no vitals filed for this visit.  Subjective Assessment - 09/25/17 0941    Subjective  Pt reports that her laryngitis got worse following her last session, so she spent yesterday mostly in bed. No pain currently.     Pertinent History  fibromyalgia, HLD, Lumbar radiculitis     Limitations  Other (comment)    How long can you sit comfortably?  unlimited    How long can you stand comfortably?  unlimited    How long can you walk comfortably?  currently only up to 30 min walks    Diagnostic tests  ultrasound: fractue healed     Patient Stated Goals  improve ankle strength  and ROM     Currently in Pain?  No/denies    Pain Onset  More than a month ago                      [facility] Adult PT Treatment/Exercise - 09/25/17 0001      Vasopneumatic   Number Minutes Vasopneumatic   15 minutes    Vasopnuematic Location   Ankle    Vasopneumatic Pressure  Medium      Ankle Exercises: Seated   Heel Raises  15 reps    Heel Raises Limitations  green TB pull medially 2x15 reps     BAPS  Level 4;Sitting;Other (comment) x1 min CW/CCW    Other Seated Ankle Exercises  Pt completing gentle self fibular mobilization with active inversion x10 reps     Other Seated Ankle Exercises  B ankle eversion x20 reps,green TB       Ankle Exercises: Standing   Heel Raises  20 reps;Other (comment) off step    Other Standing Ankle Exercises  NBOS on foam with trunk rotation Lt/Rt holding orange weighted ball x10 reps; tandem hold  on foam pad 2x30 sec each       Ankle Exercises: Stretches   Other Stretch  Lt hamstring stretch with strap 2x30 sec              PT Education - 09/25/17 1022    Education provided  Yes    Education Details  self mobilization technique for home     Person(s) Educated  Patient    Methods  Explanation    Comprehension  Verbalized understanding       PT Short Term Goals - 09/25/17 1120      PT SHORT TERM GOAL #1   Title  Pt will demo consistency and independence with HEP to improve ankle strength and ROM.     Time  3    Period  Weeks    Status  Achieved      PT SHORT TERM GOAL #2   Title  Pt will demo improved bilateral ankle dorsiflexion active ROM to atleast 10 deg which will assist with foot clearance during ambulation.     Time  3    Period  Weeks    Status  Achieved      PT SHORT TERM GOAL #3   Title  Pt will demo improved ankle proprioception evident by her ability to maintain SLS for atleast 15 sec on the Lt without postural sway or LOB, 2/3 trials.     Time  3    Period  Weeks    Status  On-going        PT Long  Term Goals - 09/02/17 0857      PT LONG TERM GOAL #1   Title  Pt will demo understanding and independence with her advanced HEP to prepare for discharge home.     Time  6    Period  Weeks    Status  New    Target Date  10/14/17      PT LONG TERM GOAL #2   Title  Pt will demo improved Lt ankle plantarflexion strength, evident by her ability to complete 25 single leg heel raises without pain or difficulty, which will improve her pushoff gait mechanics.    Time  6    Period  Weeks    Status  New      PT LONG TERM GOAL #3   Title  Pt will maintain bilateral LE single leg balance on foam pad for atleast 15 sec without significant postural sway or LOB, to decrease risk of falling or reinjury with return to her leisure activity.     Time  6    Period  Weeks    Status  New      PT LONG TERM GOAL #4   Title  Pt will report atleast a 50% improvement in her ankle stability, strength and endurance with return to her recreational activity.    Time  6    Period  Weeks    Status  New      PT LONG TERM GOAL #5   Title  Pt will demo score improvement of atleast 10% on her FOTO to reflect an increase in her functional capabilities.     Time  6    Period  Weeks    Status  New            Plan - 09/25/17 1045    Clinical Impression Statement  Pt continues to demonstrate improvements in ankle strength and proprioception. Today's session she was able to complete  progression in plantar flexion strengthening through the entire range, without reported difficulty. Ended session without any increase in pain, and therapist was able to instruct pt in self-mobilization technique for pain relief at home.     Rehab Potential  Good    PT Frequency  2x / week    PT Duration  6 weeks    PT Treatment/Interventions  ADLs/Self Care Home Management;Cryotherapy;Gait training;Stair training;Functional mobility training;Therapeutic activities;Therapeutic exercise;Patient/family education;Neuromuscular  re-education;Balance training;Manual techniques;Taping;Dry needling;Passive range of motion    PT Next Visit Plan  BAPS standing L3; progress ankle strength in closed chain positions and proprioception     PT Home Exercise Plan  ankle 4 way with blue TB, BLE heel raises, arch lifts, tandem and SLS     Consulted and Agree with Plan of Care  Patient       Patient will benefit from skilled therapeutic intervention in order to improve the following deficits and impairments:  Abnormal gait, Decreased activity tolerance, Decreased balance, Difficulty walking, Impaired flexibility, Hypomobility, Decreased strength, Decreased range of motion, Decreased endurance, Hypermobility, Pain  Visit Diagnosis: Unsteadiness on feet  Stiffness of left ankle, not elsewhere classified  Muscle weakness (generalized)     Problem List Patient Active Problem List   Diagnosis Date Noted  . Left ankle injury, subsequent encounter 06/21/2017  . Medication intolerance 07/04/2014  . Family history of early CAD 06/29/2014  . Palpitations 06/29/2014  . Hyperlipidemia   . PVC (premature ventricular contraction)   . Low back pain 06/20/2014  . Right hip pain 02/15/2014  . Right shoulder injury 02/01/2014  . Salmonella 10/27/2013  . Breast density 03/17/2013  . Renal cyst, left 01/06/2013  . Family history of colon cancer 01/03/2013  . Elevated alkaline phosphatase measurement 01/03/2013  . Articular cartilage disorder of knee 12/24/2012  . Plantar fasciitis 07/21/2012  . Anxiety 07/02/2012  . History of anemia 07/01/2012  . Depression 07/01/2012  . Menopausal hot flushes 07/01/2012  . DJD (degenerative joint disease) 07/01/2012  . Vaginal dryness, menopausal 07/01/2012  . Urge incontinence of urine 07/01/2012  . Fibromyalgia   . Family history of malignant neoplasm 12/25/2011  . Personal history of colonic polyps 12/25/2011    11:20 AM,09/25/17 Elly Modena PT, DPT [place]  [facility] [address], [place], [place], [address] Phone: [phone]   Fax:  [phone]  Name: KATTIA SELLEY MRN: [id number] Date of Birth: 05-22-1961

## 2017-09-25 NOTE — Patient Instructions (Signed)
Ball in the arch  Toe to toe walking  Stair with heel raises alternating with single leg heel raises from the ground  Single leg stance for balance  Blue band    [facility] [address] [place], [place] [address] Phone # [phone] Fax [phone]

## 2017-09-29 ENCOUNTER — Ambulatory Visit: Payer: BC Managed Care – PPO | Attending: Family Medicine | Admitting: Physical Therapy

## 2017-09-29 DIAGNOSIS — F4310 Post-traumatic stress disorder, unspecified: Secondary | ICD-10-CM | POA: Diagnosis not present

## 2017-09-29 DIAGNOSIS — M6281 Muscle weakness (generalized): Secondary | ICD-10-CM | POA: Insufficient documentation

## 2017-09-29 DIAGNOSIS — M25672 Stiffness of left ankle, not elsewhere classified: Secondary | ICD-10-CM | POA: Insufficient documentation

## 2017-09-29 DIAGNOSIS — F331 Major depressive disorder, recurrent, moderate: Secondary | ICD-10-CM | POA: Diagnosis not present

## 2017-09-29 DIAGNOSIS — R2681 Unsteadiness on feet: Secondary | ICD-10-CM | POA: Diagnosis not present

## 2017-09-29 NOTE — Therapy (Signed)
[facility] [address] [place], [place], [address] Phone: [phone]   Fax:  [phone]  Physical Therapy Treatment  Patient Details  Name: Megan Combs MRN: [id number] Date of Birth: 13-Nov-1960 Referring Provider: Karlton Lemon, MD    Encounter Date: 09/29/2017  PT End of Session - 09/29/17 0953    Visit Number  8    Number of Visits  13    Authorization Type  BCBS    Authorization Time Period  09/02/17 to 10/14/17    PT Start Time  0848    PT Stop Time  0945    PT Time Calculation (min)  57 min    Activity Tolerance  Patient tolerated treatment well;No increased pain    Behavior During Therapy  WFL for tasks assessed/performed       Past Medical History:  Diagnosis Date  . Allergy   . Anemia   . Anxiety   . Arthritis    right knee  . Degenerative disc disease   . Depression   . Family history of early CAD   . Fibromyalgia   . Fibromyalgia   . Hyperlipidemia   . IBS (irritable bowel syndrome)   . Lumbar radiculitis   . Plantar fasciitis of left foot   . PVC (premature ventricular contraction)   . Salmonella poisoning     Past Surgical History:  Procedure Laterality Date  . COLONOSCOPY    . EYE SURGERY    . KNEE ARTHROSCOPY    . KNEE ARTHROSCOPY    . laproscopy    . TONSILLECTOMY      There were no vitals filed for this visit.  Subjective Assessment - 09/29/17 0850    Subjective  Pt reports that things are going well. She reports that she is taking more of her anti-inflammatory medication more frequently. She is noticing more soreness the day after her sessions and feels that taking the medication helps a lot both physically and mentally. She did not do any of her mobilizations at home, but thinks she could've done this more. She has not done as many walking activities lately either.     Pertinent History  fibromyalgia, HLD, Lumbar radiculitis     Limitations  Other (comment)    How long  can you sit comfortably?  unlimited    How long can you stand comfortably?  unlimited    How long can you walk comfortably?  currently only up to 30 min walks    Diagnostic tests  ultrasound: fractue healed     Patient Stated Goals  improve ankle strength and ROM     Currently in Pain?  No/denies    Pain Onset  More than a month ago                      [facility] Adult PT Treatment/Exercise - 09/29/17 0001      Ankle Exercises: Standing   BAPS  Level 3;Standing;Other (comment) UE support, x1 min CW/CCW    SLS  each LE for 20+ sec (firm), each LE on foam 15+ sec x2 trials, SLS x2 trials each LE firm/EC (2-3 sec)     Other Standing Ankle Exercises  NBOS on foam with trunk rotation Lt/Rt holding orange weighted ball x10 reps      Ankle Exercises: Supine   Other Supine Ankle Exercises  BLE bridge with feet on foam pad 2x10 reps       Ankle Exercises: Sidelying  Other Sidelying Ankle Exercises  single leg clam x15 reps each with abdominal bracing and red TB       Ankle Exercises: Seated   Other Seated Ankle Exercises  active tibial inversion/eversion x10 reps LLE             PT Education - 09/29/17 1019    Education provided  Yes    Education Details  technique with therex     Person(s) Educated  Patient    Methods  Explanation;Verbal cues    Comprehension  Verbalized understanding;Returned demonstration       PT Short Term Goals - 09/29/17 0857      PT SHORT TERM GOAL #1   Title  Pt will demo consistency and independence with HEP to improve ankle strength and ROM.     Time  3    Period  Weeks    Status  Achieved      PT SHORT TERM GOAL #2   Title  Pt will demo improved bilateral ankle dorsiflexion active ROM to atleast 10 deg which will assist with foot clearance during ambulation.     Time  3    Period  Weeks    Status  Achieved      PT SHORT TERM GOAL #3   Title  Pt will demo improved ankle proprioception evident by her ability to maintain SLS for  atleast 15 sec on the Lt without postural sway or LOB, 2/3 trials.     Baseline  20+ sec    Time  3    Period  Weeks    Status  Achieved        PT Long Term Goals - 09/29/17 1771      PT LONG TERM GOAL #1   Title  Pt will demo understanding and independence with her advanced HEP to prepare for discharge home.     Time  6    Period  Weeks    Status  New      PT LONG TERM GOAL #2   Title  Pt will demo improved Lt ankle plantarflexion strength, evident by her ability to complete 25 single leg heel raises without pain or difficulty, which will improve her pushoff gait mechanics.    Time  6    Period  Weeks    Status  New      PT LONG TERM GOAL #3   Title  Pt will maintain bilateral LE single leg balance on foam pad for atleast 15 sec without significant postural sway or LOB, to decrease risk of falling or reinjury with return to her leisure activity.     Baseline  Lt: 15+ sec, Rt: 10 sec     Time  6    Period  Weeks    Status  Partially Met      PT LONG TERM GOAL #4   Title  Pt will report atleast a 50% improvement in her ankle stability, strength and endurance with return to her recreational activity.    Time  6    Period  Weeks    Status  New      PT LONG TERM GOAL #5   Title  Pt will demo score improvement of atleast 10% on her FOTO to reflect an increase in her functional capabilities.     Time  6    Period  Weeks    Status  New            Plan - 09/29/17 1657  Clinical Impression Statement  Pt is making steady progress towards her goals, demonstrating improved ankle proprioception with single leg stance this visit. She was able to maintain balance for atleast 20 seconds without LOB, and her single leg stance on unstable surfaces has improved as well. She does demonstrate limitations in proprioception with single leg balance and eyes closed, and this was updated on her HEP. Pt reports no increase in pain during or following today's session. Will continue with  current POC.     Rehab Potential  Good    PT Frequency  2x / week    PT Duration  6 weeks    PT Treatment/Interventions  ADLs/Self Care Home Management;Cryotherapy;Gait training;Stair training;Functional mobility training;Therapeutic activities;Therapeutic exercise;Patient/family education;Neuromuscular re-education;Balance training;Manual techniques;Taping;Dry needling;Passive range of motion    PT Next Visit Plan  BAPS standing L3; progress ankle strength in closed chain positions and proprioception     PT Home Exercise Plan  ankle 4 way with blue TB, BLE heel raises, arch lifts, tandem and SLS (EC)    Consulted and Agree with Plan of Care  Patient       Patient will benefit from skilled therapeutic intervention in order to improve the following deficits and impairments:  Abnormal gait, Decreased activity tolerance, Decreased balance, Difficulty walking, Impaired flexibility, Hypomobility, Decreased strength, Decreased range of motion, Decreased endurance, Hypermobility, Pain  Visit Diagnosis: Unsteadiness on feet  Stiffness of left ankle, not elsewhere classified  Muscle weakness (generalized)     Problem List Patient Active Problem List   Diagnosis Date Noted  . Left ankle injury, subsequent encounter 06/21/2017  . Medication intolerance 07/04/2014  . Family history of early CAD 06/29/2014  . Palpitations 06/29/2014  . Hyperlipidemia   . PVC (premature ventricular contraction)   . Low back pain 06/20/2014  . Right hip pain 02/15/2014  . Right shoulder injury 02/01/2014  . Salmonella 10/27/2013  . Breast density 03/17/2013  . Renal cyst, left 01/06/2013  . Family history of colon cancer 01/03/2013  . Elevated alkaline phosphatase measurement 01/03/2013  . Articular cartilage disorder of knee 12/24/2012  . Plantar fasciitis 07/21/2012  . Anxiety 07/02/2012  . History of anemia 07/01/2012  . Depression 07/01/2012  . Menopausal hot flushes 07/01/2012  . DJD (degenerative  joint disease) 07/01/2012  . Vaginal dryness, menopausal 07/01/2012  . Urge incontinence of urine 07/01/2012  . Fibromyalgia   . Family history of malignant neoplasm 12/25/2011  . Personal history of colonic polyps 12/25/2011   10:19 AM,09/29/17 Elly Modena PT, DPT [facility] [address] [place], [place], [address] Phone: [phone]   Fax:  [phone]  Name: Megan Combs MRN: [id number] Date of Birth: 03-Apr-1961

## 2017-10-01 DIAGNOSIS — Z713 Dietary counseling and surveillance: Secondary | ICD-10-CM | POA: Diagnosis not present

## 2017-10-02 ENCOUNTER — Ambulatory Visit: Payer: BC Managed Care – PPO | Admitting: Physical Therapy

## 2017-10-02 DIAGNOSIS — M25672 Stiffness of left ankle, not elsewhere classified: Secondary | ICD-10-CM | POA: Diagnosis not present

## 2017-10-02 DIAGNOSIS — R2681 Unsteadiness on feet: Secondary | ICD-10-CM

## 2017-10-02 DIAGNOSIS — M6281 Muscle weakness (generalized): Secondary | ICD-10-CM

## 2017-10-02 NOTE — Therapy (Signed)
[facility] [address], [place], [place], [address] Phone: [phone]   Fax:  [phone]  Physical Therapy Treatment  Patient Details  Name: Megan Combs MRN: [id number] Date of Birth: Mar 29, 1961 Referring Provider: Karlton Lemon, MD    Encounter Date: 10/02/2017  PT End of Session - 10/02/17 0851    Visit Number  9    Number of Visits  13    Authorization Type  BCBS    Authorization Time Period  09/02/17 to 10/14/17    PT Start Time  0848    PT Stop Time  0945    PT Time Calculation (min)  57 min    Activity Tolerance  Patient tolerated treatment well;No increased pain    Behavior During Therapy  WFL for tasks assessed/performed       Past Medical History:  Diagnosis Date  . Allergy   . Anemia   . Anxiety   . Arthritis    right knee  . Degenerative disc disease   . Depression   . Family history of early CAD   . Fibromyalgia   . Fibromyalgia   . Hyperlipidemia   . IBS (irritable bowel syndrome)   . Lumbar radiculitis   . Plantar fasciitis of left foot   . PVC (premature ventricular contraction)   . Salmonella poisoning     Past Surgical History:  Procedure Laterality Date  . COLONOSCOPY    . EYE SURGERY    . KNEE ARTHROSCOPY    . KNEE ARTHROSCOPY    . laproscopy    . TONSILLECTOMY      There were no vitals filed for this visit.  Subjective Assessment - 10/02/17 0849    Subjective  Pt reports that things are going well. She continues to take her anti-inflammatory medication which she thinks helps. Her exercises are going well.     Pertinent History  fibromyalgia, HLD, Lumbar radiculitis     Limitations  Other (comment)    How long can you sit comfortably?  unlimited    How long can you stand comfortably?  unlimited    How long can you walk comfortably?  currently only up to 30 min walks    Diagnostic tests  ultrasound: fractue healed     Patient Stated Goals  improve ankle strength and  ROM     Currently in Pain?  No/denies    Pain Onset  More than a month ago                      [facility] Adult PT Treatment/Exercise - 10/02/17 0001      Vasopneumatic   Number Minutes Vasopneumatic   15 minutes    Vasopnuematic Location   Ankle    Vasopneumatic Pressure  Medium      Ankle Exercises: Seated   Towel Crunch  3 reps      Ankle Exercises: Standing   Vector Stance  3 reps;Right;10 seconds    Heel Raises  10 reps;Limitations Red TB pull laterally on LLE x2 sets     Balance Beam  heel toe walking x16 ft for 2 trials, walking on toes x16 ft for 2 trials.     Other Standing Ankle Exercises  forward step up with contralateral knee drive and without UE support x10 reps on the Lt     Other Standing Ankle Exercises  trampoline ant/post weight shitt x1 min; lateral weight shift x1 min; standing on trampoline with  trunk rotation Lt/Rt and NBOS x10 reps (orange weighted ball); facing wall with active closed chain DF hold x3 sec (red TB around knees to decreased knee valgus) x10 reps              PT Education - 10/02/17 1015    Education provided  Yes    Education Details  technique with therex     Person(s) Educated  Patient    Methods  Explanation    Comprehension  Verbalized understanding;Returned demonstration       PT Short Term Goals - 10/02/17 0854      PT SHORT TERM GOAL #1   Title  Pt will demo consistency and independence with HEP to improve ankle strength and ROM.     Time  3    Period  Weeks    Status  Achieved      PT SHORT TERM GOAL #2   Title  Pt will demo improved bilateral ankle dorsiflexion active ROM to atleast 10 deg which will assist with foot clearance during ambulation.     Time  3    Period  Weeks    Status  Achieved      PT SHORT TERM GOAL #3   Title  Pt will demo improved ankle proprioception evident by her ability to maintain SLS for atleast 15 sec on the Lt without postural sway or LOB, 2/3 trials.     Baseline  20+ sec     Time  3    Period  Weeks    Status  Achieved        PT Long Term Goals - 09/29/17 3557      PT LONG TERM GOAL #1   Title  Pt will demo understanding and independence with her advanced HEP to prepare for discharge home.     Time  6    Period  Weeks    Status  New      PT LONG TERM GOAL #2   Title  Pt will demo improved Lt ankle plantarflexion strength, evident by her ability to complete 25 single leg heel raises without pain or difficulty, which will improve her pushoff gait mechanics.    Time  6    Period  Weeks    Status  New      PT LONG TERM GOAL #3   Title  Pt will maintain bilateral LE single leg balance on foam pad for atleast 15 sec without significant postural sway or LOB, to decrease risk of falling or reinjury with return to her leisure activity.     Baseline  Lt: 15+ sec, Rt: 10 sec     Time  6    Period  Weeks    Status  Partially Met      PT LONG TERM GOAL #4   Title  Pt will report atleast a 50% improvement in her ankle stability, strength and endurance with return to her recreational activity.    Time  6    Period  Weeks    Status  New      PT LONG TERM GOAL #5   Title  Pt will demo score improvement of atleast 10% on her FOTO to reflect an increase in her functional capabilities.     Time  6    Period  Weeks    Status  New            Plan - 10/02/17 0930    Clinical Impression Statement  Pt continues  to demonstrate improvements in ankle strength and stability, evident by her ability to complete primarily standing exercises this session. She noted no increase in ankle pain today and was able to maintain tandem stance for up to 15 sec with her eyes closed on uneven surface compared to previous sessions. Will continue with current POC to progress closed chain ankle strength, stability and proprioception.     Rehab Potential  Good    PT Frequency  2x / week    PT Duration  6 weeks    PT Treatment/Interventions  ADLs/Self Care Home  Management;Cryotherapy;Gait training;Stair training;Functional mobility training;Therapeutic activities;Therapeutic exercise;Patient/family education;Neuromuscular re-education;Balance training;Manual techniques;Taping;Dry needling;Passive range of motion    PT Next Visit Plan  BAPS standing L3; progress ankle strength in closed chain positions and proprioception; f/u on single leg heel raises     PT Home Exercise Plan  ankle 4 way with blue TB, BLE heel raises, arch lifts, tandem and SLS (EC)    Consulted and Agree with Plan of Care  Patient       Patient will benefit from skilled therapeutic intervention in order to improve the following deficits and impairments:  Abnormal gait, Decreased activity tolerance, Decreased balance, Difficulty walking, Impaired flexibility, Hypomobility, Decreased strength, Decreased range of motion, Decreased endurance, Hypermobility, Pain  Visit Diagnosis: Unsteadiness on feet  Stiffness of left ankle, not elsewhere classified  Muscle weakness (generalized)     Problem List Patient Active Problem List   Diagnosis Date Noted  . Left ankle injury, subsequent encounter 06/21/2017  . Medication intolerance 07/04/2014  . Family history of early CAD 06/29/2014  . Palpitations 06/29/2014  . Hyperlipidemia   . PVC (premature ventricular contraction)   . Low back pain 06/20/2014  . Right hip pain 02/15/2014  . Right shoulder injury 02/01/2014  . Salmonella 10/27/2013  . Breast density 03/17/2013  . Renal cyst, left 01/06/2013  . Family history of colon cancer 01/03/2013  . Elevated alkaline phosphatase measurement 01/03/2013  . Articular cartilage disorder of knee 12/24/2012  . Plantar fasciitis 07/21/2012  . Anxiety 07/02/2012  . History of anemia 07/01/2012  . Depression 07/01/2012  . Menopausal hot flushes 07/01/2012  . DJD (degenerative joint disease) 07/01/2012  . Vaginal dryness, menopausal 07/01/2012  . Urge incontinence of urine 07/01/2012   . Fibromyalgia   . Family history of malignant neoplasm 12/25/2011  . Personal history of colonic polyps 12/25/2011    10:16 AM,10/02/17 Elly Modena PT, DPT [place] [address] [place], [place], [address] Phone: [phone]   Fax:  [phone]  Name: Megan Combs MRN: [id number] Date of Birth: [birth year]

## 2017-10-06 ENCOUNTER — Encounter: Payer: BC Managed Care – PPO | Admitting: Physical Therapy

## 2017-10-07 ENCOUNTER — Telehealth: Payer: Self-pay | Admitting: Physical Therapy

## 2017-10-07 ENCOUNTER — Ambulatory Visit: Payer: BC Managed Care – PPO | Admitting: Physical Therapy

## 2017-10-07 DIAGNOSIS — R2681 Unsteadiness on feet: Secondary | ICD-10-CM | POA: Diagnosis not present

## 2017-10-07 DIAGNOSIS — M25672 Stiffness of left ankle, not elsewhere classified: Secondary | ICD-10-CM

## 2017-10-07 DIAGNOSIS — M6281 Muscle weakness (generalized): Secondary | ICD-10-CM | POA: Diagnosis not present

## 2017-10-07 MED FILL — buPROPion HCL ER (XL) 150 M: 150 | 90 days supply | Qty: 270 | Fill #0

## 2017-10-07 MED FILL — LITHIUM CARBONATE ER 300 MG: 300 | 30 days supply | Qty: 30 | Fill #0

## 2017-10-07 NOTE — Telephone Encounter (Signed)
Called pt to offer appointment this evening for either 2pm or 2:45pm. Left message for pt to call back if interested.    12:43 PM,10/07/17 Elly Modena PT, [place] [facility] [place]

## 2017-10-07 NOTE — Therapy (Signed)
[facility] [address], [place], [place], [address] Phone: [phone]   Fax:  [phone]  Physical Therapy Treatment  Patient Details  Name: Megan Combs MRN: [id number] Date of Birth: 09/04/1961 Referring Provider: Karlton Lemon, MD    Encounter Date: 10/07/2017  PT End of Session - 10/07/17 1515    Visit Number  10    Number of Visits  13    Authorization Type  BCBS    Authorization Time Period  09/02/17 to 10/14/17    PT Start Time  1513    PT Stop Time  4259    PT Time Calculation (min)  60 min    Activity Tolerance  Patient tolerated treatment well;No increased pain    Behavior During Therapy  WFL for tasks assessed/performed       Past Medical History:  Diagnosis Date  . Allergy   . Anemia   . Anxiety   . Arthritis    right knee  . Degenerative disc disease   . Depression   . Family history of early CAD   . Fibromyalgia   . Fibromyalgia   . Hyperlipidemia   . IBS (irritable bowel syndrome)   . Lumbar radiculitis   . Plantar fasciitis of left foot   . PVC (premature ventricular contraction)   . Salmonella poisoning     Past Surgical History:  Procedure Laterality Date  . COLONOSCOPY    . EYE SURGERY    . KNEE ARTHROSCOPY    . KNEE ARTHROSCOPY    . laproscopy    . TONSILLECTOMY      There were no vitals filed for this visit.  Subjective Assessment - 10/07/17 1514    Subjective  Pt reports that things continue to go well. She went for a 20 minute walk outside before coming here today. She has no pain or dicomfort currently.     Pertinent History  fibromyalgia, HLD, Lumbar radiculitis     Limitations  Other (comment)    How long can you sit comfortably?  unlimited    How long can you stand comfortably?  unlimited    How long can you walk comfortably?  currently only up to 30 min walks    Diagnostic tests  ultrasound: fractue healed     Patient Stated Goals  improve ankle strength  and ROM     Currently in Pain?  No/denies    Pain Onset  More than a month ago                      [facility] Adult PT Treatment/Exercise - 10/07/17 0001      Vasopneumatic   Number Minutes Vasopneumatic   10 minutes    Vasopnuematic Location   Ankle    Vasopneumatic Pressure  Medium      Ankle Exercises: Standing   BAPS  Level 4;Standing;Other (comment) x1 min CW/CCW    Vector Stance  3 reps;Right;10 seconds;Other (comment);Left;Limitations on foam pad; x3 trials on Lt firm/EC     Rebounder  tandem hold with red weighted ball toss x10 reps each LE forward     Heel Raises  15 reps;Other (comment) red TB pull laterally with 1 UE support, x2 sets       Ankle Exercises: Seated   Other Seated Ankle Exercises  great toe extension hold x15 reps BLE; Great toe flexion with gross toe extension x15 reps BLE     Other Seated Ankle Exercises  BLE gross toe abduction/adduction x15 reps       Ankle Exercises: Aerobic   Elliptical  L3 resistance x3 min to demo set up and parameters for use at the gym              PT Education - 10/07/17 1554    Education provided  Yes    Education Details  noted improvements in ankle stability and progressions of therex to address remaining limitations; transition into a gym routine    Person(s) Educated  Patient    Methods  Explanation;Handout;Verbal cues    Comprehension  Verbalized understanding;Returned demonstration       PT Short Term Goals - 10/02/17 0854      PT SHORT TERM GOAL #1   Title  Pt will demo consistency and independence with HEP to improve ankle strength and ROM.     Time  3    Period  Weeks    Status  Achieved      PT SHORT TERM GOAL #2   Title  Pt will demo improved bilateral ankle dorsiflexion active ROM to atleast 10 deg which will assist with foot clearance during ambulation.     Time  3    Period  Weeks    Status  Achieved      PT SHORT TERM GOAL #3   Title  Pt will demo improved ankle proprioception  evident by her ability to maintain SLS for atleast 15 sec on the Lt without postural sway or LOB, 2/3 trials.     Baseline  20+ sec    Time  3    Period  Weeks    Status  Achieved        PT Long Term Goals - 09/29/17 4492      PT LONG TERM GOAL #1   Title  Pt will demo understanding and independence with her advanced HEP to prepare for discharge home.     Time  6    Period  Weeks    Status  New      PT LONG TERM GOAL #2   Title  Pt will demo improved Lt ankle plantarflexion strength, evident by her ability to complete 25 single leg heel raises without pain or difficulty, which will improve her pushoff gait mechanics.    Time  6    Period  Weeks    Status  New      PT LONG TERM GOAL #3   Title  Pt will maintain bilateral LE single leg balance on foam pad for atleast 15 sec without significant postural sway or LOB, to decrease risk of falling or reinjury with return to her leisure activity.     Baseline  Lt: 15+ sec, Rt: 10 sec     Time  6    Period  Weeks    Status  Partially Met      PT LONG TERM GOAL #4   Title  Pt will report atleast a 50% improvement in her ankle stability, strength and endurance with return to her recreational activity.    Time  6    Period  Weeks    Status  New      PT LONG TERM GOAL #5   Title  Pt will demo score improvement of atleast 10% on her FOTO to reflect an increase in her functional capabilities.     Time  6    Period  Weeks    Status  New  Plan - 10/07/17 1608    Clinical Impression Statement  Pt is making steady progress towards her goals, noting no pain or stiffness upon arrival this session. Continued with focus on improving ankle stability and endurance with closed chain therex. Pt was able to complete vector stance on the Lt with both eyes open and eyes closed without LOB which was a major improvement from prior sessions. Therapist was able to answer questions/concerns regarding transition into a program at the gym and  pt verbalized good understanding at this time. Will reassess at next visit to determine need for skilled PT moving forward.     Rehab Potential  Good    PT Frequency  2x / week    PT Duration  6 weeks    PT Treatment/Interventions  ADLs/Self Care Home Management;Cryotherapy;Gait training;Stair training;Functional mobility training;Therapeutic activities;Therapeutic exercise;Patient/family education;Neuromuscular re-education;Balance training;Manual techniques;Taping;Dry needling;Passive range of motion    PT Next Visit Plan  reassess goals, etc. and possible d/c if met     PT Home Exercise Plan  vector stance, SLS, blue TB increased reps    Consulted and Agree with Plan of Care  Patient       Patient will benefit from skilled therapeutic intervention in order to improve the following deficits and impairments:  Abnormal gait, Decreased activity tolerance, Decreased balance, Difficulty walking, Impaired flexibility, Hypomobility, Decreased strength, Decreased range of motion, Decreased endurance, Hypermobility, Pain  Visit Diagnosis: Unsteadiness on feet  Stiffness of left ankle, not elsewhere classified  Muscle weakness (generalized)     Problem List Patient Active Problem List   Diagnosis Date Noted  . Left ankle injury, subsequent encounter 06/21/2017  . Medication intolerance 07/04/2014  . Family history of early CAD 06/29/2014  . Palpitations 06/29/2014  . Hyperlipidemia   . PVC (premature ventricular contraction)   . Low back pain 06/20/2014  . Right hip pain 02/15/2014  . Right shoulder injury 02/01/2014  . Salmonella 10/27/2013  . Breast density 03/17/2013  . Renal cyst, left 01/06/2013  . Family history of colon cancer 01/03/2013  . Elevated alkaline phosphatase measurement 01/03/2013  . Articular cartilage disorder of knee 12/24/2012  . Plantar fasciitis 07/21/2012  . Anxiety 07/02/2012  . History of anemia 07/01/2012  . Depression 07/01/2012  . Menopausal hot  flushes 07/01/2012  . DJD (degenerative joint disease) 07/01/2012  . Vaginal dryness, menopausal 07/01/2012  . Urge incontinence of urine 07/01/2012  . Fibromyalgia   . Family history of malignant neoplasm 12/25/2011  . Personal history of colonic polyps 12/25/2011    4:14 PM,10/07/17 Elly Modena PT, DPT [facility] [address] [place], [place], [address] Phone: [phone]   Fax:  [phone]  Name: Megan Combs MRN: [id number] Date of Birth: [birth year]

## 2017-10-07 NOTE — Patient Instructions (Signed)
   With pillow/rolled towel, eyes open, hold 10 sec each position.  With eyes closed, on firm ground, hold 10 sec each position       Single leg balance while completing other tasks, such as brushing teeth or combing hair     Blue band in all directions work up to 50x. (once a day), can use green band initially    Continue with any of the foot exercises.    [facility] [address] [place], [place] [address] Phone # [phone] Fax [phone]

## 2017-10-09 ENCOUNTER — Ambulatory Visit: Payer: BC Managed Care – PPO | Admitting: Physical Therapy

## 2017-10-09 DIAGNOSIS — M6281 Muscle weakness (generalized): Secondary | ICD-10-CM

## 2017-10-09 DIAGNOSIS — R2681 Unsteadiness on feet: Secondary | ICD-10-CM | POA: Diagnosis not present

## 2017-10-09 DIAGNOSIS — M25672 Stiffness of left ankle, not elsewhere classified: Secondary | ICD-10-CM

## 2017-10-09 NOTE — Therapy (Signed)
[facility] [address], [place], [place], [address] Phone: [phone]   Fax:  [phone]  Physical Therapy Treatment/Discharge  Patient Details  Name: Megan Combs MRN: [id number] Date of Birth: 1961/06/27 Referring Provider: Karlton Lemon, MD   Encounter Date: 10/09/2017  PT End of Session - 10/09/17 0936    Visit Number  11    Number of Visits  13    Authorization Type  BCBS    Authorization Time Period  09/02/17 to 10/14/17    PT Start Time  0849    PT Stop Time  0935    PT Time Calculation (min)  46 min    Activity Tolerance  Patient tolerated treatment well;No increased pain    Behavior During Therapy  WFL for tasks assessed/performed       Past Medical History:  Diagnosis Date  . Allergy   . Anemia   . Anxiety   . Arthritis    right knee  . Degenerative disc disease   . Depression   . Family history of early CAD   . Fibromyalgia   . Fibromyalgia   . Hyperlipidemia   . IBS (irritable bowel syndrome)   . Lumbar radiculitis   . Plantar fasciitis of left foot   . PVC (premature ventricular contraction)   . Salmonella poisoning     Past Surgical History:  Procedure Laterality Date  . COLONOSCOPY    . EYE SURGERY    . KNEE ARTHROSCOPY    . KNEE ARTHROSCOPY    . laproscopy    . TONSILLECTOMY      There were no vitals filed for this visit.  Subjective Assessment - 10/09/17 0850    Subjective  Pt reports that things are going well. She completed her exercises yesterday without any difficulty. She is still taking her anti-inflammatories but has not been icing as much lately. She has no pain currently. She took a yoga class yesterday and had some stiffness with her downward dog, but no actual pain.     Pertinent History  fibromyalgia, HLD, Lumbar radiculitis     Limitations  Other (comment)    How long can you sit comfortably?  unlimited    How long can you stand comfortably?  unlimited     How long can you walk comfortably?  currently only up to 30 min walks    Diagnostic tests  ultrasound: fractue healed     Patient Stated Goals  improve ankle strength and ROM     Currently in Pain?  No/denies    Pain Onset  More than a month ago         [facility] PT Assessment - 10/09/17 0001      Assessment   Referring Provider  Karlton Lemon, MD    Onset Date/Surgical Date  06/19/17    Next MD Visit  10/10/17    Prior Therapy  none       Precautions   Precautions  None      Restrictions   Weight Bearing Restrictions  No      Balance Screen   Has the patient fallen in the past 6 months  Yes    How many times?  1 cause of injury     Has the patient had a decrease in activity level because of a fear of falling?   No    Is the patient reluctant to leave their home because of a fear of falling?   No  Prior Function   Level of Independence  Independent    Vocation Requirements  currently searching for a job     Leisure  going on walks with her dogs, yoga, aerobics classes       Cognition   Overall Cognitive Status  Within Functional Limits for tasks assessed      Observation/Other Assessments   Focus on Therapeutic Outcomes (FOTO)   11% limited       Functional Tests   Functional tests  Single leg stance      Single Leg Stance   Comments  EO: Rt 30+ sec Lt 30+ sec, EC: Rt and Lt 3-4 sec; on foam Rt 15 sec, Lt 17 sec       AROM   Right Ankle Dorsiflexion  10    Right Ankle Plantar Flexion  65    Right Ankle Inversion  65    Right Ankle Eversion  35    Left Ankle Dorsiflexion  12    Left Ankle Plantar Flexion  42    Left Ankle Inversion  65    Left Ankle Eversion  35      Strength   Right Ankle Dorsiflexion  5/5    Right Ankle Plantar Flexion  5/5    Right Ankle Inversion  5/5    Right Ankle Eversion  5/5    Left Ankle Dorsiflexion  5/5    Left Ankle Plantar Flexion  4/5 Lt: 20 reps, Rt: 25 reps     Left Ankle Inversion  5/5    Left Ankle Eversion  5/5       Palpation   Palpation comment  non tender with palpation       Ambulation/Gait   Gait Comments  WNL, no antalgic pattern or deviation noted                   [facility] Adult PT Treatment/Exercise - 10/09/17 0001      Ankle Exercises: Machines for Strengthening   Cybex Leg Press  seat L6 BLE 120# x10 reps, LLE only x10 reps 50#              PT Education - 10/09/17 0944    Education provided  Yes    Education Details  goal progress and HEP moving forward as a maintenance program; use of light ankle sleeve for additional compression/proprioceptive input with return to gym/aerobic activity    Person(s) Educated  Patient    Methods  Explanation    Comprehension  Verbalized understanding       PT Short Term Goals - 10/09/17 0918      PT SHORT TERM GOAL #1   Title  Pt will demo consistency and independence with HEP to improve ankle strength and ROM.     Time  3    Period  Weeks    Status  Achieved      PT SHORT TERM GOAL #2   Title  Pt will demo improved bilateral ankle dorsiflexion active ROM to atleast 10 deg which will assist with foot clearance during ambulation.     Time  3    Period  Weeks    Status  Achieved      PT SHORT TERM GOAL #3   Title  Pt will demo improved ankle proprioception evident by her ability to maintain SLS for atleast 15 sec on the Lt without postural sway or LOB, 2/3 trials.     Baseline  20+ sec    Time  3    Period  Weeks    Status  Achieved        PT Long Term Goals - 10/09/17 0919      PT LONG TERM GOAL #1   Title  Pt will demo understanding and independence with her advanced HEP to prepare for discharge home.     Time  6    Period  Weeks    Status  Achieved      PT LONG TERM GOAL #2   Title  Pt will demo improved Lt ankle plantarflexion strength, evident by her ability to complete 25 single leg heel raises without pain or difficulty, which will improve her pushoff gait mechanics.    Baseline  20 reps on Lt    Time  6     Period  Weeks    Status  Partially Met      PT LONG TERM GOAL #3   Title  Pt will maintain bilateral LE single leg balance on foam pad for atleast 15 sec without significant postural sway or LOB, to decrease risk of falling or reinjury with return to her leisure activity.     Baseline  20+ sec     Time  6    Period  Weeks    Status  Achieved      PT LONG TERM GOAL #4   Title  Pt will report atleast a 50% improvement in her ankle stability, strength and endurance with return to her recreational activity.    Baseline  80-90% limited     Time  6    Period  Weeks    Status  Achieved      PT LONG TERM GOAL #5   Title  Pt will demo score improvement of atleast 10% on her FOTO to reflect an increase in her functional capabilities.     Baseline  11% limited    Time  6    Period  Weeks    Status  Achieved            Plan - 10/09/17 0936    Clinical Impression Statement  Pt was discharged this visit having met all of her short and long term goals. She demonstrates symmetrical and pain free ankle ROM, improved Lt ankle plantar flexion strength evident by her ability to complete single leg heel raises up to 20 reps without pain. Her ankle proprioception has improved significantly, evident by her ability to maintain single leg balance on the Lt without LOB or significant postural sway for greater than 20 sec on an unstable surface. She does have increased difficulty with single leg balance with eyes closed, however this is on both the Rt and Lt side and will continue to improve with HEP adherence moving forward. Pt has returned to walking and yoga, and is hoping to get back into the gym to improve her general fitness. She was able to complete exercises on the equipment here at the therapy gym without any ankle pain or stiffness. Pt is pleased with her progress, overall feeling 90% improve, and is agreeable with d/c at this time to allow her to complete her program independently.     Rehab  Potential  Good    PT Frequency  2x / week    PT Duration  6 weeks    PT Treatment/Interventions  ADLs/Self Care Home Management;Cryotherapy;Gait training;Stair training;Functional mobility training;Therapeutic activities;Therapeutic exercise;Patient/family education;Neuromuscular re-education;Balance training;Manual techniques;Taping;Dry needling;Passive range of motion    PT Next Visit Plan  d/c  with HEP    PT Home Exercise Plan  vector stance, SLS, blue TB increased reps    Consulted and Agree with Plan of Care  Patient       Patient will benefit from skilled therapeutic intervention in order to improve the following deficits and impairments:  Abnormal gait, Decreased activity tolerance, Decreased balance, Difficulty walking, Impaired flexibility, Hypomobility, Decreased strength, Decreased range of motion, Decreased endurance, Hypermobility, Pain  Visit Diagnosis: Unsteadiness on feet  Stiffness of left ankle, not elsewhere classified  Muscle weakness (generalized)     Problem List Patient Active Problem List   Diagnosis Date Noted  . Left ankle injury, subsequent encounter 06/21/2017  . Medication intolerance 07/04/2014  . Family history of early CAD 06/29/2014  . Palpitations 06/29/2014  . Hyperlipidemia   . PVC (premature ventricular contraction)   . Low back pain 06/20/2014  . Right hip pain 02/15/2014  . Right shoulder injury 02/01/2014  . Salmonella 10/27/2013  . Breast density 03/17/2013  . Renal cyst, left 01/06/2013  . Family history of colon cancer 01/03/2013  . Elevated alkaline phosphatase measurement 01/03/2013  . Articular cartilage disorder of knee 12/24/2012  . Plantar fasciitis 07/21/2012  . Anxiety 07/02/2012  . History of anemia 07/01/2012  . Depression 07/01/2012  . Menopausal hot flushes 07/01/2012  . DJD (degenerative joint disease) 07/01/2012  . Vaginal dryness, menopausal 07/01/2012  . Urge incontinence of urine 07/01/2012  . Fibromyalgia    . Family history of malignant neoplasm 12/25/2011  . Personal history of colonic polyps 12/25/2011    PHYSICAL THERAPY DISCHARGE SUMMARY  Visits from Start of Care: 11  Current functional level related to goals / functional outcomes: See above for more details    Remaining deficits: See above for more details    Education / Equipment: See above for more details  Plan: Patient agrees to discharge.  Patient goals were met. Patient is being discharged due to being pleased with the current functional level.  ?????       9:46 AM,10/09/17 Elly Modena PT, [place] [facility] [address], [place], [place], [address] Phone: [phone]   Fax:  [phone]  Name: LAKEYN DOKKEN MRN: [id number] Date of Birth: 05/01/1961

## 2017-10-10 ENCOUNTER — Ambulatory Visit: Payer: BC Managed Care – PPO | Admitting: Family Medicine

## 2017-10-10 ENCOUNTER — Encounter: Payer: Self-pay | Admitting: Family Medicine

## 2017-10-10 DIAGNOSIS — S99912D Unspecified injury of left ankle, subsequent encounter: Secondary | ICD-10-CM | POA: Diagnosis not present

## 2017-10-10 NOTE — Patient Instructions (Signed)
You're doing extremely well. Continue the home exercises 1-2 times a week indefinitely. Stay active - wear brace or sleeve if you're doing anything requiring side to side motion. Icing if needed. [place] once a day and eventually down to as needed. Follow up with me as needed.

## 2017-10-11 ENCOUNTER — Encounter: Payer: Self-pay | Admitting: Family Medicine

## 2017-10-11 NOTE — Progress Notes (Signed)
PCP: Lanice Shirts, MD  Subjective:   HPI: Patient is a 56 y.o. female here for left ankle injury.  8/24: Patient reports on 8/23 she was walking the dogs when she inverted her ankle on uneven pavement. Caused her to fall hard onto right knee - has abrasion here. Her pain level is 5/10 and sharp lateral left ankle. Has been doing dressing changes right knee. Taking percocet and tylenol as needed for pain. Tolerating splint she was placed in by ED. Using crutches. Bruising and swelling. No numbness.  9/7: Patient reports she's improved from last visit. Pain level down to 2/10 and more dull. Has been icing, doing home motion exercises. taking sulindac twice a day. Gets shooting sensation at times lateral ankle. No skin changes, numbness. Swelling has improved.  10/5: Patient reports she's doing well. Pain level is minimal. She takes sulindac only if needed. Some soreness only. Had a sharp pain at one time but was very fleeting. Doing well with ambulation in boot. No skin changes, numbness.  11/2: Patient reports she's doing well. Has made continued slow progress. Some stiffness, soreness lateral ankle. Slight swelling. Pain currently 0/10. Able to walk about 30 minutes twice a week. She's doing home exercises. Balance is a concern and still feels ankle is weak though. No skin changes, numbness.  12/14: Patient reports she's doing very well. She has done well with physical therapy and still doing home exercises. Able to walk, do yoga without restrictions now. Still taking her sulindac once a day and icing. Pain at worst a stiffness but currently 0/10. No skin changes, numbness.  Past Medical History:  Diagnosis Date  . Allergy   . Anemia   . Anxiety   . Arthritis    right knee  . Degenerative disc disease   . Depression   . Family history of early CAD   . Fibromyalgia   . Fibromyalgia   . Hyperlipidemia   . IBS (irritable bowel syndrome)   .  Lumbar radiculitis   . Plantar fasciitis of left foot   . PVC (premature ventricular contraction)   . Salmonella poisoning     Current Outpatient Medications on File Prior to Visit  Medication Sig Dispense Refill  . acetic acid-hydrocortisone (VOSOL-HC) otic solution Place 4 drops into both ears daily. Use for one week and then off 3 weeks 10 mL 5  . b complex vitamins tablet Take 1 tablet by mouth daily.    Marland Kitchen buPROPion (WELLBUTRIN XL) 300 MG 24 hr tablet Take 300 mg by mouth daily.    . Calcium Carbonate-Vitamin D (CALCIUM 500 + D) 500-125 MG-UNIT TABS Take 1 tablet by mouth daily.    Marland Kitchen estradiol (ESTRACE) 1 MG tablet TAKE 1 TABLET (1 MG TOTAL) BY MOUTH DAILY. 90 tablet 0  . ferrous sulfate 325 (65 FE) MG tablet Take 325 mg by mouth 3 (three) times a week.     . fish oil-omega-3 fatty acids 1000 MG capsule Take 1 g by mouth daily.    Marland Kitchen glucosamine-chondroitin 500-400 MG tablet Take 3 tablets by mouth daily.    Marland Kitchen lithium carbonate (LITHOBID) 300 MG CR tablet   0  . Multiple Vitamin (MULITIVITAMIN WITH MINERALS) TABS Take 1 tablet by mouth daily.    . Probiotic Product (PROBIOTIC DAILY PO) Take 1 Can by mouth daily.    . progesterone (PROMETRIUM) 100 MG capsule Take 1 capsule (100 mg total) by mouth daily. 90 capsule 1  . sulindac (CLINORIL) 150 MG tablet  Take 1 tablet (150 mg total) by mouth 2 (two) times daily. 60 tablet 1   No current facility-administered medications on file prior to visit.     Past Surgical History:  Procedure Laterality Date  . COLONOSCOPY    . EYE SURGERY    . KNEE ARTHROSCOPY    . KNEE ARTHROSCOPY    . laproscopy    . TONSILLECTOMY      Allergies  Allergen Reactions  . Colace  [Docusate Calcium] Other (See Comments)    cramping  . Morphine And Related     Other reaction(s): Redness IV  . Other Rash    Medical tape, band aids  . Penicillins Hives and Rash  . Zithromax [Azithromycin] Rash and Hives  . Dulcolax [Bisacodyl]     Dehydration   .  Effexor [Venlafaxine]     Intolerant SSRI/SNRI  . Erythromycin Nausea And Vomiting  . Prednisone     Paranoid , anxious, hyper emotionally, could not sleep, odd dreams  . Tape Dermatitis    Social History   Socioeconomic History  . Marital status: Married    Spouse name: Not on file  . Number of children: 0  . Years of education: Not on file  . Highest education level: Not on file  Social Needs  . Financial resource strain: Not on file  . Food insecurity - worry: Not on file  . Food insecurity - inability: Not on file  . Transportation needs - medical: Not on file  . Transportation needs - non-medical: Not on file  Occupational History  . Occupation: Programmer, multimedia: [employer], production: [employer]  Tobacco Use  . Smoking status: Never Smoker  . Smokeless tobacco: Never Used  Substance and Sexual Activity  . Alcohol use: No    Alcohol/week: 0.0 oz  . Drug use: No  . Sexual activity: Yes    Birth control/protection: Post-menopausal  Other Topics Concern  . Not on file  Social History Narrative  . Not on file    Family History  Problem Relation Age of Onset  . Colon cancer Father   . Esophageal cancer Father   . Cancer Father   . Stroke Father   . Heart disease Father   . Hypertension Father   . Hyperlipidemia Father   . Diabetes Father   . Prostate cancer Father   . Stomach cancer Father   . Heart disease Mother   . Arthritis Mother   . Heart attack Mother   . Mental illness Mother   . Diabetes Maternal Grandfather   . Heart disease Maternal Grandfather   . Mental illness Maternal Grandfather   . Stroke Maternal Grandfather   . Hypertension Brother   . Hyperlipidemia Brother   . Cancer Maternal Grandmother        circulatory cancer  . Heart disease Paternal Grandfather   . Diabetes Paternal Grandfather   . Pancreatic cancer Neg Hx   . Rectal cancer Neg Hx     BP 110/82   Pulse 97   Ht 5\' 6"  (1.676 m)   Wt 165 lb (74.8 kg)   LMP  10/28/2008   BMI 26.63 kg/m   Review of Systems: See HPI above.     Objective:  Physical Exam:  Gen: NAD, comfortable in exam room.  Left ankle/foot: No gross deformity, swelling, ecchymoses FROM with 5/5 strength. No TTP 1+ ant drawer, talar tilt. Negative syndesmotic compression. Thompsons test negative. NV  intact distally.   Assessment & Plan:  1. Left ankle injury - 2/2 avulsion fracture lateral malleolus.  Clinically improved at this point following PT, home exercises.  Encouraged to do home exercises 1-2 times a week indefinitely.  Sleeve or brace if doing anything requiring cutting, pivoting.  Icing and sulindac if needed.  F/u prn.

## 2017-10-11 NOTE — Assessment & Plan Note (Signed)
2/2 avulsion fracture lateral malleolus.  Clinically improved at this point following PT, home exercises.  Encouraged to do home exercises 1-2 times a week indefinitely.  Sleeve or brace if doing anything requiring cutting, pivoting.  Icing and sulindac if needed.  F/u prn.

## 2017-10-17 ENCOUNTER — Other Ambulatory Visit: Payer: Self-pay | Admitting: Physician Assistant

## 2017-10-17 NOTE — Telephone Encounter (Signed)
This hasn't been used in over a year from  What I can tell.

## 2017-10-29 DIAGNOSIS — Z713 Dietary counseling and surveillance: Secondary | ICD-10-CM | POA: Diagnosis not present

## 2017-11-05 DIAGNOSIS — F4310 Post-traumatic stress disorder, unspecified: Secondary | ICD-10-CM | POA: Diagnosis not present

## 2017-11-05 DIAGNOSIS — F331 Major depressive disorder, recurrent, moderate: Secondary | ICD-10-CM | POA: Diagnosis not present

## 2017-11-10 DIAGNOSIS — J310 Chronic rhinitis: Secondary | ICD-10-CM | POA: Insufficient documentation

## 2017-11-10 DIAGNOSIS — F4321 Adjustment disorder with depressed mood: Secondary | ICD-10-CM | POA: Diagnosis not present

## 2017-11-10 DIAGNOSIS — H608X3 Other otitis externa, bilateral: Secondary | ICD-10-CM | POA: Diagnosis not present

## 2017-11-10 DIAGNOSIS — J343 Hypertrophy of nasal turbinates: Secondary | ICD-10-CM | POA: Diagnosis not present

## 2017-11-10 MED FILL — AZELASTINE 0.1% (137 MCG) S: 0.1 | 50 days supply | Qty: 30 | Fill #0

## 2017-11-10 MED FILL — HYDROCORTISON-ACETIC ACID S: 1-2 | 90 days supply | Qty: 10 | Fill #0

## 2017-11-10 MED FILL — FETZIMA ER 20 MG CAPSULE: 20 | 60 days supply | Qty: 60 | Fill #0

## 2017-11-12 DIAGNOSIS — Z713 Dietary counseling and surveillance: Secondary | ICD-10-CM | POA: Diagnosis not present

## 2017-11-12 MED FILL — FLUTICASONE PROP 50 MCG SPR: 50 | 30 days supply | Qty: 16 | Fill #0

## 2017-11-14 MED FILL — ESTRADIOL 1 MG TAB: 1 | 90 days supply | Qty: 90 | Fill #1

## 2017-11-14 MED FILL — PROGESTERONE 100 MG CAPSULE: 100 | 90 days supply | Qty: 90 | Fill #1

## 2017-11-21 DIAGNOSIS — Z23 Encounter for immunization: Secondary | ICD-10-CM | POA: Diagnosis not present

## 2017-11-26 DIAGNOSIS — J020 Streptococcal pharyngitis: Secondary | ICD-10-CM | POA: Diagnosis not present

## 2017-11-26 DIAGNOSIS — J0100 Acute maxillary sinusitis, unspecified: Secondary | ICD-10-CM | POA: Diagnosis not present

## 2017-11-26 MED FILL — CIPROFLOXACIN HCL 500 MG TA: 500 | 7 days supply | Qty: 14 | Fill #0

## 2017-12-01 DIAGNOSIS — H5213 Myopia, bilateral: Secondary | ICD-10-CM | POA: Diagnosis not present

## 2017-12-01 DIAGNOSIS — Z961 Presence of intraocular lens: Secondary | ICD-10-CM | POA: Diagnosis not present

## 2017-12-03 DIAGNOSIS — F331 Major depressive disorder, recurrent, moderate: Secondary | ICD-10-CM | POA: Diagnosis not present

## 2017-12-03 DIAGNOSIS — F4310 Post-traumatic stress disorder, unspecified: Secondary | ICD-10-CM | POA: Diagnosis not present

## 2017-12-09 DIAGNOSIS — Z713 Dietary counseling and surveillance: Secondary | ICD-10-CM | POA: Diagnosis not present

## 2017-12-22 DIAGNOSIS — F4321 Adjustment disorder with depressed mood: Secondary | ICD-10-CM | POA: Diagnosis not present

## 2017-12-31 DIAGNOSIS — F331 Major depressive disorder, recurrent, moderate: Secondary | ICD-10-CM | POA: Diagnosis not present

## 2017-12-31 DIAGNOSIS — F4310 Post-traumatic stress disorder, unspecified: Secondary | ICD-10-CM | POA: Diagnosis not present

## 2018-01-13 DIAGNOSIS — Z713 Dietary counseling and surveillance: Secondary | ICD-10-CM | POA: Diagnosis not present

## 2018-01-18 MED FILL — buPROPion HCL ER (XL) 150 M: 150 | 90 days supply | Qty: 270 | Fill #1

## 2018-01-27 DIAGNOSIS — Z713 Dietary counseling and surveillance: Secondary | ICD-10-CM | POA: Diagnosis not present

## 2018-01-28 DIAGNOSIS — F4310 Post-traumatic stress disorder, unspecified: Secondary | ICD-10-CM | POA: Diagnosis not present

## 2018-01-28 DIAGNOSIS — F331 Major depressive disorder, recurrent, moderate: Secondary | ICD-10-CM | POA: Diagnosis not present

## 2018-01-30 DIAGNOSIS — H35371 Puckering of macula, right eye: Secondary | ICD-10-CM | POA: Diagnosis not present

## 2018-01-30 DIAGNOSIS — H35421 Microcystoid degeneration of retina, right eye: Secondary | ICD-10-CM | POA: Diagnosis not present

## 2018-01-30 DIAGNOSIS — H35433 Paving stone degeneration of retina, bilateral: Secondary | ICD-10-CM | POA: Diagnosis not present

## 2018-01-30 DIAGNOSIS — H43391 Other vitreous opacities, right eye: Secondary | ICD-10-CM | POA: Diagnosis not present

## 2018-02-10 DIAGNOSIS — F331 Major depressive disorder, recurrent, moderate: Secondary | ICD-10-CM | POA: Diagnosis not present

## 2018-02-10 DIAGNOSIS — F4310 Post-traumatic stress disorder, unspecified: Secondary | ICD-10-CM | POA: Diagnosis not present

## 2018-02-12 MED FILL — PROGESTERONE MICRONIZED 100: 100 | 90 days supply | Qty: 90 | Fill #2

## 2018-02-12 MED FILL — ESTRADIOL 1 MG TABS: 1 | 90 days supply | Qty: 90 | Fill #2

## 2018-02-16 MED FILL — FETZIMA ER 20 MG CAPSULE: 20 | 60 days supply | Qty: 60 | Fill #1

## 2018-03-02 DIAGNOSIS — F4321 Adjustment disorder with depressed mood: Secondary | ICD-10-CM | POA: Diagnosis not present

## 2018-03-18 ENCOUNTER — Other Ambulatory Visit: Payer: Self-pay | Admitting: Internal Medicine

## 2018-03-18 DIAGNOSIS — N644 Mastodynia: Secondary | ICD-10-CM | POA: Diagnosis not present

## 2018-03-18 DIAGNOSIS — M79622 Pain in left upper arm: Secondary | ICD-10-CM | POA: Diagnosis not present

## 2018-03-20 ENCOUNTER — Ambulatory Visit
Admission: RE | Admit: 2018-03-20 | Discharge: 2018-03-20 | Disposition: A | Discharge status: Home / Self Care | Payer: BC Managed Care – PPO | Source: Ambulatory Visit | Attending: Internal Medicine | Admitting: Internal Medicine

## 2018-03-20 DIAGNOSIS — M79622 Pain in left upper arm: Secondary | ICD-10-CM

## 2018-03-20 DIAGNOSIS — N6489 Other specified disorders of breast: Secondary | ICD-10-CM | POA: Diagnosis not present

## 2018-03-20 DIAGNOSIS — R922 Inconclusive mammogram: Secondary | ICD-10-CM | POA: Diagnosis not present

## 2018-03-24 DIAGNOSIS — Z713 Dietary counseling and surveillance: Secondary | ICD-10-CM | POA: Diagnosis not present

## 2018-03-25 DIAGNOSIS — M79622 Pain in left upper arm: Secondary | ICD-10-CM | POA: Diagnosis not present

## 2018-03-26 DIAGNOSIS — F331 Major depressive disorder, recurrent, moderate: Secondary | ICD-10-CM | POA: Diagnosis not present

## 2018-03-26 DIAGNOSIS — F4310 Post-traumatic stress disorder, unspecified: Secondary | ICD-10-CM | POA: Diagnosis not present

## 2018-04-01 DIAGNOSIS — Z0000 Encounter for general adult medical examination without abnormal findings: Secondary | ICD-10-CM | POA: Diagnosis not present

## 2018-04-01 DIAGNOSIS — E559 Vitamin D deficiency, unspecified: Secondary | ICD-10-CM | POA: Diagnosis not present

## 2018-04-01 MED FILL — NYSTATIN-TRIAMCINOLONE CRM: 100000-0.1 | 7 days supply | Qty: 15 | Fill #0

## 2018-04-02 DIAGNOSIS — Z0000 Encounter for general adult medical examination without abnormal findings: Secondary | ICD-10-CM | POA: Diagnosis not present

## 2018-04-09 DIAGNOSIS — Z713 Dietary counseling and surveillance: Secondary | ICD-10-CM | POA: Diagnosis not present

## 2018-04-20 MED FILL — FETZIMA ER 20 MG CAPSULE: 20 | 60 days supply | Qty: 60 | Fill #2

## 2018-04-22 DIAGNOSIS — F4310 Post-traumatic stress disorder, unspecified: Secondary | ICD-10-CM | POA: Diagnosis not present

## 2018-04-22 DIAGNOSIS — F331 Major depressive disorder, recurrent, moderate: Secondary | ICD-10-CM | POA: Diagnosis not present

## 2018-04-23 DIAGNOSIS — Z713 Dietary counseling and surveillance: Secondary | ICD-10-CM | POA: Diagnosis not present

## 2018-05-19 MED FILL — ESTRADIOL 1 MG TABS: 1 | 90 days supply | Qty: 90 | Fill #0

## 2018-05-19 MED FILL — PROGESTERONE 100 MG CAPSULE: 100 | 90 days supply | Qty: 90 | Fill #0

## 2018-06-01 DIAGNOSIS — F4321 Adjustment disorder with depressed mood: Secondary | ICD-10-CM | POA: Diagnosis not present

## 2018-06-15 MED FILL — buPROPion HCL ER (XL) 150 M: 150 | 30 days supply | Qty: 60 | Fill #0

## 2018-06-26 MED FILL — FETZIMA ER 20 MG CAPSULE: 20 | 60 days supply | Qty: 60 | Fill #0

## 2018-07-16 MED FILL — buPROPion HCL ER (XL) 150 M: 150 | 30 days supply | Qty: 60 | Fill #1

## 2018-07-23 DIAGNOSIS — F4310 Post-traumatic stress disorder, unspecified: Secondary | ICD-10-CM | POA: Diagnosis not present

## 2018-07-23 DIAGNOSIS — F331 Major depressive disorder, recurrent, moderate: Secondary | ICD-10-CM | POA: Diagnosis not present

## 2018-07-28 DIAGNOSIS — H35371 Puckering of macula, right eye: Secondary | ICD-10-CM | POA: Diagnosis not present

## 2018-07-28 DIAGNOSIS — H35433 Paving stone degeneration of retina, bilateral: Secondary | ICD-10-CM | POA: Diagnosis not present

## 2018-07-28 DIAGNOSIS — H35363 Drusen (degenerative) of macula, bilateral: Secondary | ICD-10-CM | POA: Diagnosis not present

## 2018-07-28 DIAGNOSIS — H3581 Retinal edema: Secondary | ICD-10-CM | POA: Diagnosis not present

## 2018-08-10 DIAGNOSIS — F331 Major depressive disorder, recurrent, moderate: Secondary | ICD-10-CM | POA: Diagnosis not present

## 2018-08-10 DIAGNOSIS — F4310 Post-traumatic stress disorder, unspecified: Secondary | ICD-10-CM | POA: Diagnosis not present

## 2018-08-17 MED FILL — PROGESTERONE 100 MG CAPSULE: 100 | 30 days supply | Qty: 30 | Fill #1

## 2018-08-17 MED FILL — ESTRADIOL 1 MG TABLET: 1 | 30 days supply | Qty: 30 | Fill #1

## 2018-08-21 MED FILL — buPROPion HCL ER (XL) 150 M: 150 | 30 days supply | Qty: 60 | Fill #2

## 2018-08-24 DIAGNOSIS — F331 Major depressive disorder, recurrent, moderate: Secondary | ICD-10-CM | POA: Diagnosis not present

## 2018-08-24 DIAGNOSIS — F4310 Post-traumatic stress disorder, unspecified: Secondary | ICD-10-CM | POA: Diagnosis not present

## 2018-09-01 MED FILL — FETZIMA ER 20 MG CAPSULE: 20 | 60 days supply | Qty: 60 | Fill #1

## 2018-09-17 MED FILL — ESTRADIOL 1 MG TABS: 1 | 30 days supply | Qty: 30 | Fill #2

## 2018-09-17 MED FILL — buPROPion HCL ER (XL) 150 M: 150 | 30 days supply | Qty: 60 | Fill #3

## 2018-09-17 MED FILL — PROGESTERONE 100 MG CAPSULE: 100 | 30 days supply | Qty: 30 | Fill #2

## 2018-09-29 DIAGNOSIS — F4321 Adjustment disorder with depressed mood: Secondary | ICD-10-CM | POA: Diagnosis not present

## 2018-10-12 DIAGNOSIS — F4310 Post-traumatic stress disorder, unspecified: Secondary | ICD-10-CM | POA: Diagnosis not present

## 2018-10-12 DIAGNOSIS — F331 Major depressive disorder, recurrent, moderate: Secondary | ICD-10-CM | POA: Diagnosis not present

## 2018-10-15 MED FILL — PROGESTERONE 100 MG CAPSULE: 100 | 30 days supply | Qty: 30 | Fill #3

## 2018-10-15 MED FILL — ESTRADIOL 1 MG TABS: 1 | 30 days supply | Qty: 30 | Fill #3

## 2018-10-15 MED FILL — buPROPion HCL ER (XL) 150 M: 150 | 30 days supply | Qty: 60 | Fill #4

## 2018-10-29 MED FILL — FETZIMA ER 20 MG CAPSULE: 20 | 60 days supply | Qty: 60 | Fill #2

## 2018-11-17 MED FILL — PROGESTERONE 100 MG CAPSULE: 100 | 30 days supply | Qty: 30 | Fill #4

## 2018-11-17 MED FILL — ESTRADIOL 1 MG TABS: 1 | 30 days supply | Qty: 30 | Fill #4

## 2018-11-19 DIAGNOSIS — F4310 Post-traumatic stress disorder, unspecified: Secondary | ICD-10-CM | POA: Diagnosis not present

## 2018-11-19 DIAGNOSIS — F331 Major depressive disorder, recurrent, moderate: Secondary | ICD-10-CM | POA: Diagnosis not present

## 2018-11-23 DIAGNOSIS — R509 Fever, unspecified: Secondary | ICD-10-CM | POA: Diagnosis not present

## 2018-11-23 MED FILL — buPROPion HCL ER (XL) 150 M: 150 | 60 days supply | Qty: 60 | Fill #0

## 2018-12-17 MED FILL — ESTRADIOL 1 MG TABS: 1 | 30 days supply | Qty: 30 | Fill #5

## 2018-12-17 MED FILL — PROGESTERONE 100 MG CAPSULE: 100 | 30 days supply | Qty: 30 | Fill #5

## 2019-01-04 MED FILL — buPROPion HCL ER (XL) 150 M: 150 | 30 days supply | Qty: 60 | Fill #1

## 2019-01-06 MED FILL — NYSTATIN-TRIAMCINOLONE CRM: 100000-0.1 | 7 days supply | Qty: 15 | Fill #0

## 2019-01-06 MED FILL — MOMETASONE FUROATE 0.1% CRM: 0.1 | 7 days supply | Qty: 15 | Fill #0

## 2019-01-12 ENCOUNTER — Other Ambulatory Visit: Payer: Self-pay

## 2019-01-12 ENCOUNTER — Emergency Department (HOSPITAL_COMMUNITY)
Admission: EM | Admit: 2019-01-12 | Discharge: 2019-01-12 | Disposition: A | Discharge status: Home / Self Care | Payer: BC Managed Care – PPO | Attending: Emergency Medicine | Admitting: Emergency Medicine

## 2019-01-12 ENCOUNTER — Encounter (HOSPITAL_COMMUNITY): Payer: Self-pay | Admitting: *Deleted

## 2019-01-12 ENCOUNTER — Emergency Department (HOSPITAL_COMMUNITY): Payer: BC Managed Care – PPO

## 2019-01-12 DIAGNOSIS — J101 Influenza due to other identified influenza virus with other respiratory manifestations: Secondary | ICD-10-CM | POA: Insufficient documentation

## 2019-01-12 DIAGNOSIS — Z79899 Other long term (current) drug therapy: Secondary | ICD-10-CM | POA: Insufficient documentation

## 2019-01-12 DIAGNOSIS — R05 Cough: Secondary | ICD-10-CM | POA: Diagnosis present

## 2019-01-12 DIAGNOSIS — R6889 Other general symptoms and signs: Secondary | ICD-10-CM

## 2019-01-12 LAB — CBC
HCT: 40.2 % (ref 36.0–46.0)
Hemoglobin: 13.6 g/dL (ref 12.0–15.0)
MCH: 30.4 pg (ref 26.0–34.0)
MCHC: 33.8 g/dL (ref 30.0–36.0)
MCV: 89.7 fL (ref 80.0–100.0)
Platelets: 204 10*3/uL (ref 150–400)
RBC: 4.48 MIL/uL (ref 3.87–5.11)
RDW: 12.1 % (ref 11.5–15.5)
WBC: 7.5 10*3/uL (ref 4.0–10.5)
nRBC: 0 % (ref 0.0–0.2)

## 2019-01-12 LAB — COMPREHENSIVE METABOLIC PANEL
ALT: 15 U/L (ref 0–44)
AST: 24 U/L (ref 15–41)
Albumin: 3.5 g/dL (ref 3.5–5.0)
Alkaline Phosphatase: 114 U/L (ref 38–126)
Anion gap: 8 (ref 5–15)
BUN: 15 mg/dL (ref 6–20)
CHLORIDE: 111 mmol/L (ref 98–111)
CO2: 21 mmol/L — ABNORMAL LOW (ref 22–32)
Calcium: 9.1 mg/dL (ref 8.9–10.3)
Creatinine, Ser: 0.74 mg/dL (ref 0.44–1.00)
GFR calc Af Amer: >60 mL/min (ref >60)
Glucose, Bld: 87 mg/dL (ref 70–99)
Potassium: 3.9 mmol/L (ref 3.5–5.1)
Sodium: 140 mmol/L (ref 135–145)
Total Bilirubin: 0.6 mg/dL (ref 0.3–1.2)
Total Protein: 6.7 g/dL (ref 6.5–8.1)

## 2019-01-12 MED ORDER — SODIUM CHLORIDE 0.9 % IV SOLN: INTRAVENOUS | Status: DC

## 2019-01-12 MED ORDER — SODIUM CHLORIDE 0.9 % IV BOLUS
1000.0000 mL | Freq: Once | INTRAVENOUS | Status: AC
  Administered 2019-01-12: 1000 mL via INTRAVENOUS

## 2019-01-12 NOTE — ED Triage Notes (Signed)
Pt in from home via [facility], per report pt was sent by PCP for r/o Coronavirus d/t neg flu test at PCP office yesterday with symptom onset this weekend, pt c/o SOB onset today, pts fever reported as 98 or 99 temp, pt A&O x4, denies recent travel, pt denies being immunocompromised

## 2019-01-12 NOTE — ED Provider Notes (Addendum)
[facility] EMERGENCY DEPARTMENT Provider Note   CSN: [id number] Arrival date & time: 01/12/19  0931    History   Chief Complaint Chief Complaint  Patient presents with  . Influenza    HPI Megan Combs is a 58 y.o. female.     Patient with upper respiratory symptoms to include low-grade fever cough and some feeling of shortness of breath that started today.  Patient being followed by primary care provider.  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  Primary was planning on testing her for coronavirus today apparently has a few test available however patient started to get short of breath so she was referred here for evaluation.  Patient has no known exposure.  No travel to any high risk areas or any recent travel at all.  Patient does not is not immunocompromised.  Patient states that today she seemed to have some air hunger and some feeling of shortness of breath.  Patient symptoms started over the weekend.     Past Medical History:  Diagnosis Date  . Allergy   . Anemia   . Anxiety   . Arthritis    right knee  . Degenerative disc disease   . Depression   . Family history of early CAD   . Fibromyalgia   . Fibromyalgia   . Hyperlipidemia   . IBS (irritable bowel syndrome)   . Lumbar radiculitis   . Plantar fasciitis of left foot   . PVC (premature ventricular contraction)   . Salmonella poisoning     Patient Active Problem List   Diagnosis Date Noted  . Left ankle injury, subsequent encounter 06/21/2017  . Medication intolerance 07/04/2014  . Family history of early CAD 06/29/2014  . Palpitations 06/29/2014  . Hyperlipidemia   . PVC (premature ventricular contraction)   . Low back pain 06/20/2014  . Right hip pain 02/15/2014  . Right shoulder injury 02/01/2014  . Salmonella 10/27/2013  . Breast density 03/17/2013  . Renal cyst, left 01/06/2013  . Family history of colon cancer 01/03/2013  .  Elevated alkaline phosphatase measurement 01/03/2013  . Articular cartilage disorder of knee 12/24/2012  . Plantar fasciitis 07/21/2012  . Anxiety 07/02/2012  . History of anemia 07/01/2012  . Depression 07/01/2012  . Menopausal hot flushes 07/01/2012  . DJD (degenerative joint disease) 07/01/2012  . Vaginal dryness, menopausal 07/01/2012  . Urge incontinence of urine 07/01/2012  . Fibromyalgia   . Family history of malignant neoplasm 12/25/2011  . Personal history of colonic polyps 12/25/2011    Past Surgical History:  Procedure Laterality Date  . COLONOSCOPY    . EYE SURGERY    . KNEE ARTHROSCOPY    . KNEE ARTHROSCOPY    . laproscopy    . TONSILLECTOMY       OB History    Gravida  2   Para      Term      Preterm      AB  1   Living  0     SAB      TAB      Ectopic  1   Multiple      Live Births               Home Medications    Prior to Admission medications   Medication Sig Start Date End Date Taking? Authorizing Provider  b complex vitamins tablet Take 1 tablet by mouth daily.  Yes [provider]  buPROPion (WELLBUTRIN XL) 300 MG 24 hr tablet Take 300 mg by mouth daily.   Yes [provider]  Calcium Carbonate-Vitamin D (CALCIUM 500 + D) 500-125 MG-UNIT TABS Take 1 tablet by mouth daily.   Yes [provider]  estradiol (ESTRACE) 1 MG tablet TAKE 1 TABLET (1 MG TOTAL) BY MOUTH DAILY. Patient taking differently: Take 1 mg by mouth at bedtime.  10/19/15  Yes Elby Beck, FNP  ferrous sulfate 325 (65 FE) MG tablet Take 325 mg by mouth 3 (three) times a week.    Yes [provider]  FETZIMA 20 MG CP24 Take 40 mg by mouth every other day.  01/04/19  Yes [provider]  fish oil-omega-3 fatty acids 1000 MG capsule Take 1 g by mouth daily.   Yes [provider]  glucosamine-chondroitin 500-400 MG tablet Take 3 tablets by mouth daily.   Yes [provider]  mometasone (ELOCON) 0.1 %  cream Apply 1 application topically daily. 01/06/19  Yes [provider]  Multiple Vitamin (MULITIVITAMIN WITH MINERALS) TABS Take 1 tablet by mouth daily.   Yes [provider]  nystatin-triamcinolone (MYCOLOG II) cream Apply 1 application topically daily. 01/06/19  Yes [provider]  Probiotic Product (PROBIOTIC DAILY PO) Take 1 Can by mouth daily.   Yes [provider]  progesterone (PROMETRIUM) 100 MG capsule Take 1 capsule (100 mg total) by mouth daily. Patient taking differently: Take 100 mg by mouth at bedtime.  03/28/15  Yes Elby Beck, FNP  acetic acid-hydrocortisone (VOSOL-HC) otic solution Place 4 drops into both ears daily. Use for one week and then off 3 weeks Patient not taking: Reported on 01/12/2019 07/04/15   Tereasa Coop, PA-C  lithium carbonate (LITHOBID) 300 MG CR tablet  06/12/17   [provider]  sulindac (CLINORIL) 150 MG tablet Take 1 tablet (150 mg total) by mouth 2 (two) times daily. Patient not taking: Reported on 01/12/2019 06/20/17   Dene Gentry, MD    Family History Family History  Problem Relation Age of Onset  . Colon cancer Father   . Esophageal cancer Father   . Cancer Father   . Stroke Father   . Heart disease Father   . Hypertension Father   . Hyperlipidemia Father   . Diabetes Father   . Prostate cancer Father   . Stomach cancer Father   . Heart disease Mother   . Arthritis Mother   . Heart attack Mother   . Mental illness Mother   . Diabetes Maternal Grandfather   . Heart disease Maternal Grandfather   . Mental illness Maternal Grandfather   . Stroke Maternal Grandfather   . Hypertension Brother   . Hyperlipidemia Brother   . Cancer Maternal Grandmother        circulatory cancer  . Heart disease Paternal Grandfather   . Diabetes Paternal Grandfather   . Pancreatic cancer Neg Hx   . Rectal cancer Neg Hx     Social History Social History   Tobacco Use  . Smoking status: Never  Smoker  . Smokeless tobacco: Never Used  Substance Use Topics  . Alcohol use: No    Alcohol/week: 0.0 standard drinks  . Drug use: No     Allergies   Colace  [docusate calcium]; Morphine and related; Other; Penicillins; Zithromax [azithromycin]; Dulcolax [bisacodyl]; Effexor [venlafaxine]; Erythromycin; Prednisone; and Tape   Review of Systems Review of Systems  Constitutional: Positive for fever.  Negative for chills.  HENT: Negative for congestion, rhinorrhea and sore throat.   Eyes: Negative for redness and visual disturbance.  Respiratory: Positive for cough and shortness of breath.   Cardiovascular: Negative for chest pain and leg swelling.  Gastrointestinal: Negative for abdominal pain, diarrhea, nausea and vomiting.  Genitourinary: Negative for dysuria.  Musculoskeletal: Negative for back pain and neck pain.  Skin: Negative for rash.  Neurological: Negative for dizziness, light-headedness and headaches.  Hematological: Does not bruise/bleed easily.  Psychiatric/Behavioral: Negative for confusion.     Physical Exam Updated Vital Signs BP 123/67   Pulse 75   Temp 98.8 F (37.1 C) (Oral)   Resp 18   LMP 10/28/2008   SpO2 100%   Physical Exam Vitals signs and nursing note reviewed.  Constitutional:      General: She is not in acute distress.    Appearance: She is well-developed.  HENT:     Head: Normocephalic and atraumatic.     Mouth/Throat:     Mouth: Mucous membranes are moist.  Eyes:     Extraocular Movements: Extraocular movements intact.     Conjunctiva/sclera: Conjunctivae normal.  Neck:     Musculoskeletal: Normal range of motion and neck supple.  Cardiovascular:     Rate and Rhythm: Normal rate and regular rhythm.     Heart sounds: No murmur.  Pulmonary:     Effort: Pulmonary effort is normal. No respiratory distress.     Breath sounds: Normal breath sounds. No wheezing, rhonchi or rales.  Abdominal:     Palpations: Abdomen is soft.      Tenderness: There is no abdominal tenderness.  Musculoskeletal: Normal range of motion.  Skin:    General: Skin is warm and dry.     Capillary Refill: Capillary refill takes less than 2 seconds.  Neurological:     General: No focal deficit present.     Mental Status: She is alert and oriented to person, place, and time.      ED Treatments / Results  Labs (all labs ordered are listed, but only abnormal results are displayed) Labs Reviewed  COMPREHENSIVE METABOLIC PANEL - Abnormal; Notable for the following components:      Result Value   CO2 21 (*)    All other components within normal limits  CBC    EKG EKG Interpretation  Date/Time:  Tuesday January 12 2019 09:52:10 EDT Ventricular Rate:  69 PR Interval:    QRS Duration: 87 QT Interval:  383 QTC Calculation: 411 R Axis:   89 Text Interpretation:  Sinus rhythm Confirmed by Fredia Sorrow [phone]) on 01/12/2019 10:02:38 AM   Radiology Dg Chest 2 View  Result Date: 01/12/2019 CLINICAL DATA:  Flu-like symptoms. EXAM: CHEST - 2 VIEW COMPARISON:  11/20/2015. FINDINGS: Mediastinum hilar structures normal. Heart size normal. Lungs are clear. Stable right base mild pleural thickening consistent scarring. Mild thoracic spine scoliosis. IMPRESSION: No acute cardiopulmonary disease. Electronically Signed   By: Marcello Moores  Register   On: 01/12/2019 11:14    Procedures Procedures (including critical care time)  Medications Ordered in ED Medications  0.9 %  sodium chloride infusion (has no administration in time range)  sodium chloride 0.9 % bolus 1,000 mL (1,000 mLs Intravenous New Bag/Given 01/12/19 1052)     Initial Impression / Assessment and Plan / ED Course  I have reviewed the triage vital signs and the nursing notes.  Pertinent labs & imaging results that were available during my care of the patient were reviewed by  me and considered in my medical decision making (see chart for details).        Patient here nontoxic no  acute distress.  Oxygen saturation is 97 to 98% on room air.  Not tachycardic.  No fever currently.  Patient does have upper respiratory flulike symptoms.  Did test negative for flu.  Patient did have a nursing home exposure over the weekend when she did have a cough.  Apparently patient's primary care doctor was planning on testing her today for coronavirus.  Patient currently does not meet criteria for admission cannot completely rule out that she has corona since her primary can test would recommend that she get that done.  Patient given precautions on when to return.  Patient's labs here today white count electrolytes without any significant abnormalities.  Chest x-ray negative for any acute findings at all.  Patient will continue self quarantining.  Final Clinical Impressions(s) / ED Diagnoses   Final diagnoses:  Flu-like symptoms    ED Discharge Orders    None       Fredia Sorrow, MD 01/12/19 1206    Fredia Sorrow, MD 01/12/19 1207

## 2019-01-12 NOTE — ED Notes (Signed)
Patient verbalizes understanding of discharge instructions. Opportunity for questioning and answers were provided. Armband removed by staff, pt discharged from ED. Pt aware to return to PCP for testing and continue self-quarantine. Pt to wait in room until husband arrives to pick up.

## 2019-01-12 NOTE — Discharge Instructions (Addendum)
Work-up here today labs chest x-ray negative no indication for admission.  But since her primary care doctor has the ability to do the coronavirus test and was planning to do that on you I would give them a call and arrange for that to be done.  Return for any new or worse symptoms.  Return for worsening shortness of breath return for persistent cough or high fevers.  Continue self quarantining.

## 2019-01-15 MED FILL — PROGESTERONE 100 MG CAPSULE: 100 | 30 days supply | Qty: 30 | Fill #6

## 2019-01-15 MED FILL — ESTRADIOL 1 MG TABS: 1 | 30 days supply | Qty: 30 | Fill #6

## 2019-01-20 MED FILL — buPROPion HCL ER (XL) 150 M: 150 | 30 days supply | Qty: 60 | Fill #0

## 2019-01-28 MED FILL — levoFLOXacin 500 MG TABS: 500 | 7 days supply | Qty: 7 | Fill #0

## 2019-02-11 MED FILL — SULFAMETHOXAZOLE-TMP DS TAB: 800-160 | 5 days supply | Qty: 10 | Fill #0

## 2019-02-15 MED FILL — ESTRADIOL 1 MG TABS: 1 | 90 days supply | Qty: 90 | Fill #0

## 2019-02-15 MED FILL — PROGESTERONE 100 MG CAPSULE: 100 | 90 days supply | Qty: 90 | Fill #0

## 2019-03-08 MED FILL — buPROPion HCL ER (XL) 150 M: 150 | 30 days supply | Qty: 60 | Fill #1

## 2019-03-16 MED FILL — ACETIC ACID 2% EAR SOLUTION: 2 | 90 days supply | Qty: 15 | Fill #0

## 2019-03-29 MED FILL — KETOROLAC 0.5% OPHTH SOLN: 0.5 | 25 days supply | Qty: 5 | Fill #0

## 2019-03-29 MED FILL — TIMOLOL 0.5% EYE DROPS: 0.5 | 25 days supply | Qty: 5 | Fill #0

## 2019-03-29 MED FILL — MOXIFLOXACIN HCL 0.5 % SOLN: 0.5 | 15 days supply | Qty: 3 | Fill #0

## 2019-03-30 MED FILL — LOTEPREDNOL ETABONATE 0.5 %: 0.5 | 50 days supply | Qty: 10 | Fill #0

## 2019-04-27 MED FILL — buPROPion HCL ER (XL) 300 M: 300 | 90 days supply | Qty: 90 | Fill #0

## 2019-05-05 MED FILL — FLUTICASONE PROP 50 MCG SPR: 50 | 30 days supply | Qty: 16 | Fill #0

## 2019-05-19 MED FILL — ESTRADIOL 1 MG TABS: 1 | 90 days supply | Qty: 90 | Fill #1

## 2019-05-19 MED FILL — PROGESTERONE 100 MG CAPSULE: 100 | 90 days supply | Qty: 90 | Fill #1

## 2019-05-26 ENCOUNTER — Other Ambulatory Visit: Payer: Self-pay | Admitting: Internal Medicine

## 2019-05-26 DIAGNOSIS — Z1231 Encounter for screening mammogram for malignant neoplasm of breast: Secondary | ICD-10-CM

## 2019-05-29 HISTORY — PX: EYE SURGERY: SHX253

## 2019-06-15 MED FILL — FETZIMA ER 40 MG CAPSULE: 40 | 30 days supply | Qty: 30 | Fill #0

## 2019-06-29 ENCOUNTER — Other Ambulatory Visit: Payer: Self-pay | Admitting: Internal Medicine

## 2019-06-29 DIAGNOSIS — Z780 Asymptomatic menopausal state: Secondary | ICD-10-CM

## 2019-07-02 ENCOUNTER — Ambulatory Visit: Payer: BC Managed Care – PPO

## 2019-07-05 ENCOUNTER — Encounter: Payer: Self-pay | Admitting: Gastroenterology

## 2019-07-16 MED FILL — FETZIMA ER 40 MG CAPSULE: 40 | 30 days supply | Qty: 30 | Fill #1

## 2019-07-20 ENCOUNTER — Ambulatory Visit: Payer: BC Managed Care – PPO | Admitting: Cardiology

## 2019-07-20 ENCOUNTER — Encounter: Payer: Self-pay | Admitting: Cardiology

## 2019-07-20 ENCOUNTER — Other Ambulatory Visit: Payer: Self-pay

## 2019-07-20 VITALS — BP 147/89 | HR 93 | Ht 66.0 in | Wt 176.2 lb

## 2019-07-20 DIAGNOSIS — E7800 Pure hypercholesterolemia, unspecified: Secondary | ICD-10-CM

## 2019-07-20 DIAGNOSIS — Z8249 Family history of ischemic heart disease and other diseases of the circulatory system: Secondary | ICD-10-CM

## 2019-07-20 DIAGNOSIS — I493 Ventricular premature depolarization: Secondary | ICD-10-CM | POA: Diagnosis not present

## 2019-07-20 NOTE — Patient Instructions (Signed)
Medication Instructions:  Your physician recommends that you continue on your current medications as directed. Please refer to the Current Medication list given to you today.  If you need a refill on your cardiac medications before your next appointment, please call your pharmacy.   Lab work: None Ordered  Testing/Procedures: None Ordered  Follow-Up: At [facility], you and your health needs are our priority.  As part of our continuing mission to provide you with exceptional heart care, we have created designated Provider Care Teams.  These Care Teams include your primary Cardiologist (physician) and Advanced Practice Providers (APPs -  Physician Assistants and Nurse Practitioners) who all work together to provide you with the care you need, when you need it. You will need a follow up appointment in 1 years.  Please call our office 2 months in advance to schedule this appointment.  You may see Dr. Radford Pax or one of the following Advanced Practice Providers on your designated Care Team:   [place], PA-C Melina Copa, PA-C . Ermalinda Barrios, PA-C

## 2019-07-20 NOTE — Progress Notes (Signed)
Cardiology Office Note:    Date:  07/20/2019   ID:  Megan Combs, DOB 10-May-1961, MRN [id number]  PCP:  Leeroy Cha, MD  Cardiologist:  No primary care provider on file.    Referring MD: Lanice Shirts, *   Chief Complaint  Patient presents with  . Follow-up    Fm hx of CAD, PVcs HLD    History of Present Illness:    Megan Combs is a 58 y.o. female with a hx of dyslipidemia, family history of CAD and palpitations with heart monitor showing PVC's.She is here today for followup and is doing well.  She denies any chest pain or pressure, SOB, DOE, PND, orthopnea, LE edema, dizziness, palpitations or syncope. She is compliant with her meds and is tolerating meds with no SE.    Past Medical History:  Diagnosis Date  . Allergy   . Anemia   . Anxiety   . Arthritis    right knee  . Degenerative disc disease   . Depression   . Family history of early CAD   . Fibromyalgia   . Fibromyalgia   . Hyperlipidemia   . IBS (irritable bowel syndrome)   . Lumbar radiculitis   . Plantar fasciitis of left foot   . PVC (premature ventricular contraction)   . Salmonella poisoning     Past Surgical History:  Procedure Laterality Date  . COLONOSCOPY    . EYE SURGERY    . KNEE ARTHROSCOPY    . KNEE ARTHROSCOPY    . laproscopy    . TONSILLECTOMY      Current Medications: Current Meds  Medication Sig  . acetic acid-hydrocortisone (VOSOL-HC) otic solution Place 4 drops into both ears daily. Use for one week and then off 3 weeks  . b complex vitamins tablet Take 1 tablet by mouth daily.  Marland Kitchen buPROPion (WELLBUTRIN XL) 300 MG 24 hr tablet Take 300 mg by mouth daily.  . Calcium Carbonate-Vitamin D (CALCIUM 500 + D) 500-125 MG-UNIT TABS Take 1 tablet by mouth daily.  Marland Kitchen estradiol (ESTRACE) 1 MG tablet TAKE 1 TABLET (1 MG TOTAL) BY MOUTH DAILY. (Patient taking differently: Take 1 mg by mouth at bedtime. )  . ferrous sulfate 325 (65 FE) MG tablet Take 325 mg by mouth 3  (three) times a week.   Marland Kitchen FETZIMA 20 MG CP24 Take 40 mg by mouth every other day.   . fish oil-omega-3 fatty acids 1000 MG capsule Take 1 g by mouth daily.  Marland Kitchen glucosamine-chondroitin 500-400 MG tablet Take 3 tablets by mouth daily.  . mometasone (ELOCON) 0.1 % cream Apply 1 application topically daily.  . Multiple Vitamin (MULITIVITAMIN WITH MINERALS) TABS Take 1 tablet by mouth daily.  Marland Kitchen nystatin-triamcinolone (MYCOLOG II) cream Apply 1 application topically daily.  . Probiotic Product (PROBIOTIC DAILY PO) Take 1 Can by mouth daily.  . progesterone (PROMETRIUM) 100 MG capsule Take 1 capsule (100 mg total) by mouth daily. (Patient taking differently: Take 100 mg by mouth at bedtime. )     Allergies:   Colace  [docusate calcium], Morphine and related, Other, Penicillins, Zithromax [azithromycin], Dulcolax [bisacodyl], Effexor [venlafaxine], Erythromycin, Prednisone, and Tape   Social History   Socioeconomic History  . Marital status: Married    Spouse name: Not on file  . Number of children: 0  . Years of education: Not on file  . Highest education level: Not on file  Occupational History  . Occupation: Programmer, multimedia: Pensions consultant  Employer: [employer]  Social Needs  . Financial resource strain: Not on file  . Food insecurity    Worry: Not on file    Inability: Not on file  . Transportation needs    Medical: Not on file    Non-medical: Not on file  Tobacco Use  . Smoking status: Never Smoker  . Smokeless tobacco: Never Used  Substance and Sexual Activity  . Alcohol use: No    Alcohol/week: 0.0 standard drinks  . Drug use: No  . Sexual activity: Yes    Birth control/protection: Post-menopausal  Lifestyle  . Physical activity    Days per week: Not on file    Minutes per session: Not on file  . Stress: Not on file  Relationships  . Social Herbalist on phone: Not on file    Gets together: Not on file    Attends religious service: Not on file     Active member of club or organization: Not on file    Attends meetings of clubs or organizations: Not on file    Relationship status: Not on file  Other Topics Concern  . Not on file  Social History Narrative  . Not on file     Family History: The patient's family history includes Arthritis in her mother; Cancer in her father and maternal grandmother; Colon cancer in her father; Diabetes in her father, maternal grandfather, and paternal grandfather; Esophageal cancer in her father; Heart attack in her mother; Heart disease in her father, maternal grandfather, mother, and paternal grandfather; Hyperlipidemia in her brother and father; Hypertension in her brother and father; Mental illness in her maternal grandfather and mother; Prostate cancer in her father; Stomach cancer in her father; Stroke in her father and maternal grandfather. There is no history of Pancreatic cancer or Rectal cancer.  ROS:   Please see the history of present illness.    ROS  All other systems reviewed and negative.   EKGs/Labs/Other Studies Reviewed:    The following studies were reviewed today: none  EKG:  EKG is not ordered today.    Recent Labs: 01/12/2019: ALT 15; BUN 15; Creatinine, Ser 0.74; Hemoglobin 13.6; Platelets 204; Potassium 3.9; Sodium 140   Recent Lipid Panel    Component Value Date/Time   CHOL 192 09/05/2017 0812   TRIG 79 09/05/2017 0812   HDL 80 09/05/2017 0812   CHOLHDL 2.4 09/05/2017 0812   CHOLHDL 2.3 07/26/2016 0811   VLDL 10 07/26/2016 0811   LDLCALC 96 09/05/2017 0812    Physical Exam:    VS:  BP (!) 147/89   Pulse 93   Ht 5\' 6"  (1.676 m)   Wt 176 lb 3.2 oz (79.9 kg)   LMP 10/28/2008   BMI 28.44 kg/m     Wt Readings from Last 3 Encounters:  07/20/19 176 lb 3.2 oz (79.9 kg)  10/10/17 165 lb (74.8 kg)  09/04/17 168 lb 1.9 oz (76.3 kg)     GEN:  Well nourished, well developed in no acute distress HEENT: Normal NECK: No JVD; No carotid bruits LYMPHATICS: No  lymphadenopathy CARDIAC: RRR, no murmurs, rubs, gallops RESPIRATORY:  Clear to auscultation without rales, wheezing or rhonchi  ABDOMEN: Soft, non-tender, non-distended MUSCULOSKELETAL:  No edema; No deformity  SKIN: Warm and dry NEUROLOGIC:  Alert and oriented x 3 PSYCHIATRIC:  Normal affect   ASSESSMENT:    1. PVC (premature ventricular contraction)   2. Pure hypercholesterolemia   3. Family history  of early CAD    PLAN:    In order of problems listed above:  1.  PVCs - she has not had any palpitations.    2.  HLD - this is followed by her PCP  3.  Fm hx of CAD - she has not had any CP or SOB.  Nuclear stress test in 2017 showed no ischemia.   Medication Adjustments/Labs and Tests Ordered: Current medicines are reviewed at length with the patient today.  Concerns regarding medicines are outlined above.  No orders of the defined types were placed in this encounter.  No orders of the defined types were placed in this encounter.   Signed, Fransico Him, MD  07/20/2019 4:26 PM    [place]

## 2019-07-26 ENCOUNTER — Encounter: Payer: Self-pay | Admitting: Gastroenterology

## 2019-07-27 MED FILL — buPROPion HCL ER (XL) 300 M: 300 | 90 days supply | Qty: 90 | Fill #0

## 2019-07-28 MED FILL — FETZIMA ER 20 MG CAPSULE: 20 | 30 days supply | Qty: 30 | Fill #0

## 2019-08-16 MED FILL — ESTRADIOL 1 MG TABS: 1 | 90 days supply | Qty: 90 | Fill #0

## 2019-08-16 MED FILL — PROGESTERONE 100 MG CAPSULE: 100 | 90 days supply | Qty: 90 | Fill #0

## 2019-08-20 ENCOUNTER — Other Ambulatory Visit: Payer: Self-pay

## 2019-08-20 ENCOUNTER — Ambulatory Visit
Admission: RE | Admit: 2019-08-20 | Discharge: 2019-08-20 | Disposition: A | Discharge status: Home / Self Care | Payer: BC Managed Care – PPO | Source: Ambulatory Visit | Attending: Internal Medicine | Admitting: Internal Medicine

## 2019-08-20 DIAGNOSIS — Z1231 Encounter for screening mammogram for malignant neoplasm of breast: Secondary | ICD-10-CM

## 2019-08-23 MED FILL — FETZIMA ER 20 MG CAPSULE: 20 | 60 days supply | Qty: 60 | Fill #0

## 2019-08-27 ENCOUNTER — Ambulatory Visit (AMBULATORY_SURGERY_CENTER): Payer: BC Managed Care – PPO | Admitting: *Deleted

## 2019-08-27 ENCOUNTER — Encounter: Payer: Self-pay | Admitting: Gastroenterology

## 2019-08-27 ENCOUNTER — Encounter: Payer: Self-pay | Admitting: Internal Medicine

## 2019-08-27 ENCOUNTER — Other Ambulatory Visit: Payer: Self-pay

## 2019-08-27 VITALS — Temp 97.1°F | Ht 66.0 in | Wt 174.0 lb

## 2019-08-27 DIAGNOSIS — Z86010 Personal history of colonic polyps: Secondary | ICD-10-CM

## 2019-08-27 DIAGNOSIS — Z800 Family history of malignant neoplasm of digestive organs: Secondary | ICD-10-CM

## 2019-08-27 DIAGNOSIS — Z1159 Encounter for screening for other viral diseases: Secondary | ICD-10-CM

## 2019-08-27 MED ORDER — NA SULFATE-K SULFATE-MG SULF 17.5-3.13-1.6 GM/177ML PO SOLN: ORAL | 0 refills | Status: DC

## 2019-08-27 MED FILL — SUPREP BOWEL PREP KIT: 17.5-3.13-1 | 1 days supply | Qty: 354 | Fill #0

## 2019-08-27 NOTE — Progress Notes (Signed)
Patient is here in-person for PV. Patient denies any allergies to eggs or soy. Patient denies any problems with anesthesia/sedation. Patient denies any oxygen use at home. Patient denies taking any diet/weight loss medications or blood thinners. Patient is not being treated for MRSA or C-diff. EMMI education assisgned to the patient for the procedure, this was explained and instructions given to patient. COVID-19 screening test is on 11/10 8 am, the pt is aware. Pt is aware that care partner will wait in the car during procedure; if they feel like they will be too hot or cold to wait in the car; they may wait in the 4 th floor lobby. Patient is aware to bring only one care partner. We want them to wear a mask (we do not have any that we can provide them), practice social distancing, and we will check their temperatures when they get here.  I did remind the patient that their care partner needs to stay in the parking lot the entire time and have a cell phone available, we will call them when the pt is ready for discharge. Patient will wear mask into building.    Suprep $15 off coupon given to the patient.

## 2019-08-31 MED FILL — HYDROCORTISON-ACETIC ACID S: 1-2 | 25 days supply | Qty: 10 | Fill #0

## 2019-09-03 ENCOUNTER — Other Ambulatory Visit: Payer: Self-pay

## 2019-09-03 ENCOUNTER — Ambulatory Visit
Admission: RE | Admit: 2019-09-03 | Discharge: 2019-09-03 | Disposition: A | Discharge status: Home / Self Care | Payer: BC Managed Care – PPO | Source: Ambulatory Visit | Attending: Internal Medicine | Admitting: Internal Medicine

## 2019-09-03 DIAGNOSIS — Z780 Asymptomatic menopausal state: Secondary | ICD-10-CM

## 2019-09-07 ENCOUNTER — Other Ambulatory Visit: Payer: Self-pay | Admitting: Gastroenterology

## 2019-09-07 ENCOUNTER — Ambulatory Visit (INDEPENDENT_AMBULATORY_CARE_PROVIDER_SITE_OTHER): Payer: BC Managed Care – PPO

## 2019-09-07 DIAGNOSIS — Z1159 Encounter for screening for other viral diseases: Secondary | ICD-10-CM

## 2019-09-08 LAB — SARS CORONAVIRUS 2 (TAT 6-24 HRS): SARS Coronavirus 2: NEGATIVE

## 2019-09-10 ENCOUNTER — Other Ambulatory Visit: Payer: Self-pay

## 2019-09-10 ENCOUNTER — Encounter: Payer: Self-pay | Admitting: Gastroenterology

## 2019-09-10 ENCOUNTER — Ambulatory Visit (AMBULATORY_SURGERY_CENTER): Payer: BC Managed Care – PPO | Admitting: Gastroenterology

## 2019-09-10 VITALS — BP 151/82 | HR 63 | Temp 98.3°F | Resp 9 | Ht 66.0 in | Wt 174.0 lb

## 2019-09-10 DIAGNOSIS — Z86010 Personal history of colonic polyps: Secondary | ICD-10-CM

## 2019-09-10 DIAGNOSIS — Z800 Family history of malignant neoplasm of digestive organs: Secondary | ICD-10-CM | POA: Diagnosis not present

## 2019-09-10 DIAGNOSIS — D125 Benign neoplasm of sigmoid colon: Secondary | ICD-10-CM

## 2019-09-10 MED ORDER — SODIUM CHLORIDE 0.9 % IV SOLN: 500.0000 mL | Freq: Once | INTRAVENOUS | Status: DC

## 2019-09-10 NOTE — Progress Notes (Signed)
Pt's states no medical or surgical changes since previsit or office visit. VS by CW. Temp by JB 

## 2019-09-10 NOTE — Progress Notes (Signed)
A and O x3. Report to RN. Tolerated MAC anesthesia well.

## 2019-09-10 NOTE — Patient Instructions (Signed)
Information on polyps given to you today.  Await pathology results.   YOU HAD AN ENDOSCOPIC PROCEDURE TODAY AT THE [facility]:   Refer to the procedure report that was given to you for any specific questions about what was found during the examination.  If the procedure report does not answer your questions, please call your gastroenterologist to clarify.  If you requested that your care partner not be given the details of your procedure findings, then the procedure report has been included in a sealed envelope for you to review at your convenience later.  YOU SHOULD EXPECT: Some feelings of bloating in the abdomen. Passage of more gas than usual.  Walking can help get rid of the air that was put into your GI tract during the procedure and reduce the bloating. If you had a lower endoscopy (such as a colonoscopy or flexible sigmoidoscopy) you may notice spotting of blood in your stool or on the toilet paper. If you underwent a bowel prep for your procedure, you may not have a normal bowel movement for a few days.  Please Note:  You might notice some irritation and congestion in your nose or some drainage.  This is from the oxygen used during your procedure.  There is no need for concern and it should clear up in a day or so.  SYMPTOMS TO REPORT IMMEDIATELY:   Following lower endoscopy (colonoscopy or flexible sigmoidoscopy):  Excessive amounts of blood in the stool  Significant tenderness or worsening of abdominal pains  Swelling of the abdomen that is new, acute  Fever of 100F or higher   For urgent or emergent issues, a gastroenterologist can be reached at any hour by calling [phone].   DIET:  We do recommend a small meal at first, but then you may proceed to your regular diet.  Drink plenty of fluids but you should avoid alcoholic beverages for 24 hours.  ACTIVITY:  You should plan to take it easy for the rest of today and you should NOT DRIVE or use heavy machinery  until tomorrow (because of the sedation medicines used during the test).    FOLLOW UP: Our staff will call the number listed on your records 48-72 hours following your procedure to check on you and address any questions or concerns that you may have regarding the information given to you following your procedure. If we do not reach you, we will leave a message.  We will attempt to reach you two times.  During this call, we will ask if you have developed any symptoms of COVID 19. If you develop any symptoms (ie: fever, flu-like symptoms, shortness of breath, cough etc.) before then, please call [phone].  If you test positive for Covid 19 in the 2 weeks post procedure, please call and report this information to us.    If any biopsies were taken you will be contacted by phone or by letter within the next 1-3 weeks.  Please call us at [phone] if you have not heard about the biopsies in 3 weeks.    SIGNATURES/CONFIDENTIALITY: You and/or your care partner have signed paperwork which will be entered into your electronic medical record.  These signatures attest to the fact that that the information above on your After Visit Summary has been reviewed and is understood.  Full responsibility of the confidentiality of this discharge information lies with you and/or your care-partner. 

## 2019-09-10 NOTE — Op Note (Signed)
[facility] Patient Name: Megan Combs Procedure Date: 09/10/2019 7:52 AM MRN: [id number] Endoscopist: Milus Banister , MD Age: 58 Referring MD:  Date of Birth: 1961/05/19 Gender: Female Account #: [id number] Procedure:                Colonoscopy Indications:              Screening in patient at increased risk: Family                            history of 1st-degree relative with colorectal                            cancer before age 77 years; father diagnosed in his                            late 7s Medicines:                Monitored Anesthesia Care Procedure:                Pre-Anesthesia Assessment:                           - Prior to the procedure, a History and Physical                            was performed, and patient medications and                            allergies were reviewed. The patient's tolerance of                            previous anesthesia was also reviewed. The risks                            and benefits of the procedure and the sedation                            options and risks were discussed with the patient.                            All questions were answered, and informed consent                            was obtained. Prior Anticoagulants: The patient has                            taken no previous anticoagulant or antiplatelet                            agents. ASA Grade Assessment: II - A patient with                            mild systemic disease. After reviewing the risks  and benefits, the patient was deemed in                            satisfactory condition to undergo the procedure.                           After obtaining informed consent, the colonoscope                            was passed under direct vision. Throughout the                            procedure, the patient's blood pressure, pulse, and                            oxygen saturations were monitored continuously. The                          Colonoscope was introduced through the anus and                            advanced to the the cecum, identified by                            appendiceal orifice and ileocecal valve. The                            colonoscopy was performed without difficulty. The                            patient tolerated the procedure well. The quality                            of the bowel preparation was good. The ileocecal                            valve, appendiceal orifice, and rectum were                            photographed. Scope In: 8:02:18 AM Scope Out: 8:21:17 AM Scope Withdrawal Time: 0 hours 14 minutes 13 seconds  Total Procedure Duration: 0 hours 18 minutes 59 seconds  Findings:                 Two sessile polyps were found in the sigmoid colon.                            The polyps were 2 to 3 mm in size. These polyps                            were removed with a cold snare. Resection and                            retrieval were complete.  The exam was otherwise without abnormality on                            direct and retroflexion views. Complications:            No immediate complications. Estimated blood loss:                            None. Estimated Blood Loss:     Estimated blood loss: none. Impression:               - Two 2 to 3 mm polyps in the sigmoid colon,                            removed with a cold snare. Resected and retrieved.                           - The examination was otherwise normal on direct                            and retroflexion views. Recommendation:           - Patient has a contact number available for                            emergencies. The signs and symptoms of potential                            delayed complications were discussed with the                            patient. Return to normal activities tomorrow.                            Written discharge instructions were provided  to the                            patient.                           - Resume previous diet.                           - Continue present medications.                           - Await pathology results. Milus Banister, MD 09/10/2019 8:24:41 AM This report has been signed electronically.

## 2019-09-10 NOTE — Progress Notes (Signed)
Called to room to assist during endoscopic procedure.  Patient ID and intended procedure confirmed with present staff. Received instructions for my participation in the procedure from the performing physician.  

## 2019-09-14 ENCOUNTER — Telehealth: Payer: Self-pay | Admitting: *Deleted

## 2019-09-14 NOTE — Telephone Encounter (Signed)
Message left

## 2019-09-15 ENCOUNTER — Encounter: Payer: Self-pay | Admitting: Gastroenterology

## 2019-09-15 NOTE — Telephone Encounter (Signed)
Pt returned call and said she is doing good since her procedure

## 2019-10-11 MED FILL — FLUTICASONE PROP 50 MCG SPR: 50 | 30 days supply | Qty: 16 | Fill #1

## 2019-10-25 MED FILL — FETZIMA ER 20 MG CAPSULE: 20 | 90 days supply | Qty: 90 | Fill #0

## 2019-11-05 MED FILL — NYSTATIN-TRIAMCINOLONE CRM: 100000-0.1 | 7 days supply | Qty: 15 | Fill #1

## 2019-11-08 MED FILL — ESTRADIOL 1 MG TABS: 1 | 90 days supply | Qty: 90 | Fill #0

## 2019-11-08 MED FILL — buPROPion HCL ER (XL) 300 M: 300 | 90 days supply | Qty: 90 | Fill #1

## 2019-11-08 MED FILL — PROGESTERONE 100 MG CAPSULE: 100 | 90 days supply | Qty: 90 | Fill #0

## 2019-11-29 ENCOUNTER — Ambulatory Visit (INDEPENDENT_AMBULATORY_CARE_PROVIDER_SITE_OTHER): Payer: BC Managed Care – PPO | Admitting: Family Medicine

## 2019-11-29 ENCOUNTER — Other Ambulatory Visit: Payer: Self-pay

## 2019-11-29 ENCOUNTER — Encounter: Payer: Self-pay | Admitting: Family Medicine

## 2019-11-29 VITALS — BP 128/82 | Ht 66.0 in | Wt 167.0 lb

## 2019-11-29 DIAGNOSIS — R269 Unspecified abnormalities of gait and mobility: Secondary | ICD-10-CM

## 2019-11-29 NOTE — Progress Notes (Signed)
PCP: Leeroy Cha, MD  Subjective:   HPI: Patient is a 59 y.o. female here for custom orthotics.  Patient returns for new custom orthotics - last pairs made in 2014. She would like an additional pair for her dress shoes. No complaints at this time. Noted arches look like they've 'fallen' more since last time she had orthotics made. She healed very well from her distal fibula fracture.  Past Medical History:  Diagnosis Date  . Allergy   . Anemia   . Anxiety   . Arthritis    right knee  . Degenerative disc disease   . Depression   . Family history of early CAD   . Fibromyalgia   . Fibromyalgia   . Hyperlipidemia   . IBS (irritable bowel syndrome)   . Lumbar radiculitis   . Plantar fasciitis of left foot   . PVC (premature ventricular contraction)   . Salmonella poisoning     Current Outpatient Medications on File Prior to Visit  Medication Sig Dispense Refill  . acetic acid 2 % otic solution     . acetic acid-hydrocortisone (VOSOL-HC) otic solution Place 4 drops into both ears daily. Use for one week and then off 3 weeks (Patient not taking: Reported on 08/27/2019) 10 mL 5  . azelastine (ASTELIN) 0.1 % nasal spray Place into the nose.    . B Complex-C-E-Zn (B COMPLEX-C-E-ZINC) tablet Take by mouth.    Marland Kitchen buPROPion (WELLBUTRIN XL) 150 MG 24 hr tablet TK 2 TS PO Q MORNING    . buPROPion (WELLBUTRIN XL) 300 MG 24 hr tablet Take 300 mg by mouth daily.    . Calcium Carbonate-Vitamin D (CALCIUM 500 + D) 500-125 MG-UNIT TABS Take 1 tablet by mouth daily.    . diclofenac (VOLTAREN) 75 MG EC tablet as needed.    Marland Kitchen estradiol (ESTRACE) 1 MG tablet TAKE 1 TABLET (1 MG TOTAL) BY MOUTH DAILY. (Patient taking differently: Take 1 mg by mouth at bedtime. ) 90 tablet 0  . ferrous sulfate 325 (65 FE) MG tablet Take 325 mg by mouth 3 (three) times a week.     Marland Kitchen FETZIMA 20 MG CP24 Take 40 mg by mouth every other day.     . fish oil-omega-3 fatty acids 1000 MG capsule Take 1 g by mouth  daily.    . fluticasone (FLONASE) 50 MCG/ACT nasal spray     . Glucosamine Sulfate 500 MG CAPS Take by mouth.    . mometasone (ELOCON) 0.1 % cream Apply 1 application topically daily.    . Multiple Vitamin (MULITIVITAMIN WITH MINERALS) TABS Take 1 tablet by mouth daily.    Marland Kitchen nystatin-triamcinolone (MYCOLOG II) cream Apply 1 application topically daily.    . Probiotic Product (PROBIOTIC DAILY PO) Take 1 Can by mouth daily.    . progesterone (PROMETRIUM) 100 MG capsule Take 1 capsule (100 mg total) by mouth daily. (Patient taking differently: Take 100 mg by mouth at bedtime. ) 90 capsule 1  . sulindac (CLINORIL) 150 MG tablet Take by mouth.    Marland Kitchen VITAMIN D PO Take by mouth.     No current facility-administered medications on file prior to visit.    Past Surgical History:  Procedure Laterality Date  . COLONOSCOPY  last 08/16/2014  . EYE SURGERY Right 05/2019   Cataract  . KNEE ARTHROSCOPY    . KNEE ARTHROSCOPY    . laproscopy    . POLYPECTOMY    . TONSILLECTOMY      Allergies  Allergen Reactions  . Colace  [Docusate Calcium] Other (See Comments)    cramping  . Morphine And Related     Other reaction(s): Redness IV  . Other Rash    Medical tape, band aids  . Penicillins Hives and Rash    Did it involve swelling of the face/tongue/throat, SOB, or low BP? No Did it involve sudden or severe rash/hives, skin peeling, or any reaction on the inside of your mouth or nose? No Did you need to seek medical attention at a hospital or doctor's office? No When did it last happen?4 years ago If all above answers are "NO", may proceed with cephalosporin use.  . Zithromax [Azithromycin] Rash and Hives  . Dulcolax [Bisacodyl]     Dehydration   . Effexor [Venlafaxine]     Intolerant SSRI/SNRI  . Erythromycin Nausea And Vomiting  . Prednisone Other (See Comments)    Paranoid , anxious, hyper emotionally, could not sleep, odd dreams Causes severe anxiety and insomnia.  . Tape  Dermatitis    Social History   Socioeconomic History  . Marital status: Married    Spouse name: Not on file  . Number of children: 0  . Years of education: Not on file  . Highest education level: Not on file  Occupational History  . Occupation: Programmer, multimedia: [employer], production: [employer]  Tobacco Use  . Smoking status: Never Smoker  . Smokeless tobacco: Never Used  Substance and Sexual Activity  . Alcohol use: No    Alcohol/week: 0.0 standard drinks  . Drug use: No  . Sexual activity: Yes    Birth control/protection: Post-menopausal  Other Topics Concern  . Not on file  Social History Narrative  . Not on file   Social Determinants of Health   Financial Resource Strain:   . Difficulty of Paying Living Expenses: Not on file  Food Insecurity:   . Worried About Charity fundraiser in the Last Year: Not on file  . Ran Out of Food in the Last Year: Not on file  Transportation Needs:   . Lack of Transportation (Medical): Not on file  . Lack of Transportation (Non-Medical): Not on file  Physical Activity:   . Days of Exercise per Week: Not on file  . Minutes of Exercise per Session: Not on file  Stress:   . Feeling of Stress : Not on file  Social Connections:   . Frequency of Communication with Friends and Family: Not on file  . Frequency of Social Gatherings with Friends and Family: Not on file  . Attends Religious Services: Not on file  . Active Member of Clubs or Organizations: Not on file  . Attends Archivist Meetings: Not on file  . Marital Status: Not on file  Intimate Partner Violence:   . Fear of Current or Ex-Partner: Not on file  . Emotionally Abused: Not on file  . Physically Abused: Not on file  . Sexually Abused: Not on file    Family History  Problem Relation Age of Onset  . Colon cancer Father   . Esophageal cancer Father   . Cancer Father   . Stroke Father   . Heart disease Father   . Hypertension Father   .  Hyperlipidemia Father   . Diabetes Father   . Prostate cancer Father   . Stomach cancer Father   . Colon polyps Father   . Heart disease Mother   .  Arthritis Mother   . Heart attack Mother   . Mental illness Mother   . Diabetes Maternal Grandfather   . Heart disease Maternal Grandfather   . Mental illness Maternal Grandfather   . Stroke Maternal Grandfather   . Hypertension Brother   . Hyperlipidemia Brother   . Cancer Maternal Grandmother        circulatory cancer  . Heart disease Paternal Grandfather   . Diabetes Paternal Grandfather   . Pancreatic cancer Neg Hx   . Rectal cancer Neg Hx     BP 128/82   Ht 5\' 6"  (1.676 m)   Wt 167 lb (75.8 kg)   LMP 10/28/2008   BMI 26.95 kg/m   Review of Systems: See HPI above.     Objective:  Physical Exam:  Gen: NAD, comfortable in exam room  Right foot/ankle: Splaying 2nd and 3rd digits.  Transverse arch collapse with hammering of digits.  Mild long arch collapse.  Mild calcaneal valgus.  No other gross deformity, swelling, ecchymoses.  No hallux rigidus or valgus. FROM with 5/5 strength all directions. No TTP 1+ ant drawer and talar tilt.   Negative syndesmotic compression. Thompsons test negative. NV intact distally.  Left foot/ankle: Splaying 2nd and 3rd digits.  Transverse arch collapse with hammering of digits.  Mild long arch collapse.  No other gross deformity, swelling, ecchymoses.  No hallux rigidus or valgus. FROM with 5/5 strength all directions. No TTP 1+ ant drawer and talar tilt.   Negative syndesmotic compression. Thompsons test negative. NV intact distally.   Assessment & Plan:  1. Gait abnormality - with transverse arch collapse but no pain.  Mild long arch collapse as well.  No current complaints.  New custom orthotics made with additional dress orthotics - felt comfortable and better control of pronation with her gait.  F/u prn.  Can consider heel wedge with or without 1st ray post on right for  stability in future if adjustment needed for calcaneal valgus.  Patient was fitted for a : standard, cushioned, semi-rigid orthotic. The orthotic was heated and afterward the patient stood on the orthotic blank positioned on the orthotic stand. The patient was positioned in subtalar neutral position and 10 degrees of ankle dorsiflexion in a weight bearing stance. After completion of molding, a stable base was applied to the orthotic blank. The blank was ground to a stable position for weight bearing. Size: 8 red cambray and 8 dress orthotic Base: blue med density eva Posting: none Additional orthotic padding: none

## 2019-12-01 MED FILL — FLUTICASONE PROP 50 MCG SPR: 50 | 30 days supply | Qty: 16 | Fill #2

## 2020-01-21 MED FILL — FLUTICASONE PROP 50 MCG SPR: 50 | 30 days supply | Qty: 16 | Fill #3

## 2020-01-26 ENCOUNTER — Other Ambulatory Visit (HOSPITAL_COMMUNITY): Payer: Self-pay | Admitting: Psychiatry

## 2020-01-26 MED FILL — buPROPion HCL ER (XL) 300 M: 300 | 90 days supply | Qty: 90 | Fill #0

## 2020-02-10 MED FILL — HYDROCORTISON-ACETIC ACID S: 1-2 | 25 days supply | Qty: 10 | Fill #1

## 2020-02-18 MED FILL — PROGESTERONE 100 MG CAPS: 100 | 90 days supply | Qty: 90 | Fill #1

## 2020-02-18 MED FILL — ESTRADIOL 1 MG TABS: 1 | 90 days supply | Qty: 90 | Fill #1

## 2020-04-12 ENCOUNTER — Other Ambulatory Visit (HOSPITAL_COMMUNITY): Payer: Self-pay | Admitting: Internal Medicine

## 2020-04-12 MED FILL — MOMETASONE FUROATE 0.1% CRM: 0.1 | 7 days supply | Qty: 30 | Fill #0

## 2020-04-18 ENCOUNTER — Other Ambulatory Visit: Payer: Self-pay | Admitting: Internal Medicine

## 2020-04-18 DIAGNOSIS — R1011 Right upper quadrant pain: Secondary | ICD-10-CM

## 2020-04-24 ENCOUNTER — Ambulatory Visit (HOSPITAL_COMMUNITY)
Admission: RE | Admit: 2020-04-24 | Discharge: 2020-04-24 | Disposition: A | Discharge status: Home / Self Care | Payer: BC Managed Care – PPO | Source: Ambulatory Visit | Attending: Internal Medicine | Admitting: Internal Medicine

## 2020-04-24 ENCOUNTER — Other Ambulatory Visit: Payer: Self-pay

## 2020-04-24 DIAGNOSIS — R1011 Right upper quadrant pain: Secondary | ICD-10-CM | POA: Insufficient documentation

## 2020-04-25 ENCOUNTER — Other Ambulatory Visit (HOSPITAL_COMMUNITY): Payer: Self-pay | Admitting: Psychiatry

## 2020-04-25 MED FILL — buPROPion HCL ER (XL) 300 M: 300 | 90 days supply | Qty: 90 | Fill #0

## 2020-05-15 ENCOUNTER — Other Ambulatory Visit: Payer: Self-pay

## 2020-05-15 ENCOUNTER — Ambulatory Visit (INDEPENDENT_AMBULATORY_CARE_PROVIDER_SITE_OTHER): Payer: BC Managed Care – PPO | Admitting: Family Medicine

## 2020-05-15 VITALS — BP 151/77 | Ht 66.5 in | Wt 154.0 lb

## 2020-05-15 DIAGNOSIS — M25561 Pain in right knee: Secondary | ICD-10-CM | POA: Diagnosis not present

## 2020-05-15 NOTE — Patient Instructions (Signed)
You have distal IT band syndrome Avoid painful activities as much as possible. Ice over area of pain 3-4 times a day for 15 minutes at a time Hip side raise exercise 3 sets of 10 once a day - add weights if this becomes too easy. Stretches - pick 2-3 and hold for 20-30 seconds x 3 - do once or twice a day. Tylenol and/or aleve as needed for pain. If not improving, can consider physical therapy and/or steroid injection. Follow up with me in 4-6 weeks.  Your thumb pain is due to arthritis. These are the different medications you can take for this: Tylenol 500mg  1-2 tabs three times a day for pain. Capsaicin, aspercreme, or biofreeze topically up to four times a day may also help with pain. Some supplements that may help for arthritis: Boswellia extract, curcumin, pycnogenol Try voltaren gel up to 4 times a day. Cortisone injections are an option. Thumb spica braces may help with this when it's really flared up Heat or ice 15 minutes at a time 3-4 times a day as needed to help with pain.

## 2020-05-16 ENCOUNTER — Encounter: Payer: Self-pay | Admitting: Family Medicine

## 2020-05-16 NOTE — Progress Notes (Signed)
PCP: Leeroy Cha, MD  Subjective:   HPI: Patient is a 59 y.o. female here for right knee pain.  Patient reports since about May she's had lateral right knee pain. No acute injury. Started noticing this with low impact Zumba - had some lateral pain without swelling during class. Also feels like this flared up with yoga. Has tried icing, ibuprofen, knee sleeve with only mild improvement. No catching, giving out.  Past Medical History:  Diagnosis Date  . Allergy   . Anemia   . Anxiety   . Arthritis    right knee  . Degenerative disc disease   . Depression   . Family history of early CAD   . Fibromyalgia   . Fibromyalgia   . Hyperlipidemia   . IBS (irritable bowel syndrome)   . Lumbar radiculitis   . Plantar fasciitis of left foot   . PVC (premature ventricular contraction)   . Salmonella poisoning     Current Outpatient Medications on File Prior to Visit  Medication Sig Dispense Refill  . acetic acid 2 % otic solution     . acetic acid-hydrocortisone (VOSOL-HC) otic solution Place 4 drops into both ears daily. Use for one week and then off 3 weeks (Patient not taking: Reported on 08/27/2019) 10 mL 5  . azelastine (ASTELIN) 0.1 % nasal spray Place into the nose.    . B Complex-C-E-Zn (B COMPLEX-C-E-ZINC) tablet Take by mouth.    Marland Kitchen buPROPion (WELLBUTRIN XL) 150 MG 24 hr tablet TK 2 TS PO Q MORNING    . buPROPion (WELLBUTRIN XL) 300 MG 24 hr tablet Take 300 mg by mouth daily.    . Calcium Carbonate-Vitamin D (CALCIUM 500 + D) 500-125 MG-UNIT TABS Take 1 tablet by mouth daily.    . diclofenac (VOLTAREN) 75 MG EC tablet as needed.    Marland Kitchen estradiol (ESTRACE) 1 MG tablet TAKE 1 TABLET (1 MG TOTAL) BY MOUTH DAILY. (Patient taking differently: Take 1 mg by mouth at bedtime. ) 90 tablet 0  . ferrous sulfate 325 (65 FE) MG tablet Take 325 mg by mouth 3 (three) times a week.     Marland Kitchen FETZIMA 20 MG CP24 Take 40 mg by mouth every other day.     . fish oil-omega-3 fatty acids 1000  MG capsule Take 1 g by mouth daily.    . fluticasone (FLONASE) 50 MCG/ACT nasal spray     . Glucosamine Sulfate 500 MG CAPS Take by mouth.    . mometasone (ELOCON) 0.1 % cream Apply 1 application topically daily.    . Multiple Vitamin (MULITIVITAMIN WITH MINERALS) TABS Take 1 tablet by mouth daily.    Marland Kitchen nystatin-triamcinolone (MYCOLOG II) cream Apply 1 application topically daily.    . Probiotic Product (PROBIOTIC DAILY PO) Take 1 Can by mouth daily.    . progesterone (PROMETRIUM) 100 MG capsule Take 1 capsule (100 mg total) by mouth daily. (Patient taking differently: Take 100 mg by mouth at bedtime. ) 90 capsule 1  . sulindac (CLINORIL) 150 MG tablet Take by mouth.    Marland Kitchen VITAMIN D PO Take by mouth.     No current facility-administered medications on file prior to visit.    Past Surgical History:  Procedure Laterality Date  . COLONOSCOPY  last 08/16/2014  . EYE SURGERY Right 05/2019   Cataract  . KNEE ARTHROSCOPY    . KNEE ARTHROSCOPY    . laproscopy    . POLYPECTOMY    . TONSILLECTOMY  Allergies  Allergen Reactions  . Colace  [Docusate Calcium] Other (See Comments)    cramping  . Morphine And Related     Other reaction(s): Redness IV  . Other Rash    Medical tape, band aids  . Penicillins Hives and Rash    Did it involve swelling of the face/tongue/throat, SOB, or low BP? No Did it involve sudden or severe rash/hives, skin peeling, or any reaction on the inside of your mouth or nose? No Did you need to seek medical attention at a hospital or doctor's office? No When did it last happen?4 years ago If all above answers are "NO", may proceed with cephalosporin use.  . Zithromax [Azithromycin] Rash and Hives  . Dulcolax [Bisacodyl]     Dehydration   . Effexor [Venlafaxine]     Intolerant SSRI/SNRI  . Erythromycin Nausea And Vomiting  . Prednisone Other (See Comments)    Paranoid , anxious, hyper emotionally, could not sleep, odd dreams Causes severe anxiety and  insomnia.  . Tape Dermatitis    Social History   Socioeconomic History  . Marital status: Married    Spouse name: Not on file  . Number of children: 0  . Years of education: Not on file  . Highest education level: Not on file  Occupational History  . 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 Smoking status: Never Smoker  . Smokeless tobacco: Never Used  Vaping Use  . Vaping Use: Never used  Substance and Sexual Activity  . Alcohol use: No    Alcohol/week: 0.0 standard drinks  . Drug use: No  . Sexual activity: Yes    Birth control/protection: Post-menopausal  Other Topics Concern  . Not on file  Social History Narrative  . Not on file   Social Determinants of Health   Financial Resource Strain:   . Difficulty of Paying Living Expenses:   Food Insecurity:   . Worried About Charity fundraiser in the Last Year:   . Arboriculturist in the Last Year:   Transportation Needs:   . Film/video editor (Medical):   Marland Kitchen Lack of Transportation (Non-Medical):   Physical Activity:   . Days of Exercise per Week:   . Minutes of Exercise per Session:   Stress:   . Feeling of Stress :   Social Connections:   . Frequency of Communication with Friends and Family:   . Frequency of Social Gatherings with Friends and Family:   . Attends Religious Services:   . Active Member of Clubs or Organizations:   . Attends Archivist Meetings:   Marland Kitchen Marital Status:   Intimate Partner Violence:   . Fear of Current or Ex-Partner:   . Emotionally Abused:   Marland Kitchen Physically Abused:   . Sexually Abused:     Family History  Problem Relation Age of Onset  . Colon cancer Father   . Esophageal cancer Father   . Cancer Father   . Stroke Father   . Heart disease Father   . Hypertension Father   . Hyperlipidemia Father   . Diabetes Father   . Prostate cancer Father   . Stomach cancer Father   . Colon polyps Father   . Heart disease  Mother   . Arthritis Mother   . Heart attack Mother   . Mental illness Mother   . Diabetes Maternal Grandfather   . Heart disease  Maternal Grandfather   . Mental illness Maternal Grandfather   . Stroke Maternal Grandfather   . Hypertension Brother   . Hyperlipidemia Brother   . Cancer Maternal Grandmother        circulatory cancer  . Heart disease Paternal Grandfather   . Diabetes Paternal Grandfather   . Pancreatic cancer Neg Hx   . Rectal cancer Neg Hx     BP (!) 151/77   Ht 5' 6.5" (1.689 m)   Wt 154 lb (69.9 kg)   LMP 10/28/2008   BMI 24.48 kg/m   Review of Systems: See HPI above.     Objective:  Physical Exam:  Gen: NAD, comfortable in exam room  Right knee: No gross deformity, ecchymoses, swelling. TTP distal IT band, some lateral joint line. FROM with normal strength. Negative ant/post drawers. Negative valgus/varus testing. Negative lachmans. Negative mcmurrays, apleys, bounce, patellar apprehension.  Pain laterally with thessalys but felt through IT band and not just at joint line. NV intact distally.   Assessment & Plan:  1. Right knee pain - consistent with distal IT band syndrome, less likely lateral meniscus tear.  Shown home exercises and stretches to do daily.  Tylenol and/or aleve as needed.  Icing.  Consider formal physical therapy, injection if not improving.  F/u in 4-6 weeks.  She also asked about pain she's been having at base of thumbs - exam consistent with 1st [facility] arthritis.  See instructions for discussion.

## 2020-05-27 MED FILL — PROGESTERONE 100 MG CAPS: 100 | 90 days supply | Qty: 90 | Fill #0

## 2020-05-27 MED FILL — ESTRADIOL 1 MG TABS: 1 | 90 days supply | Qty: 90 | Fill #0

## 2020-06-12 ENCOUNTER — Ambulatory Visit: Payer: BC Managed Care – PPO | Admitting: Family Medicine

## 2020-06-14 ENCOUNTER — Ambulatory Visit (INDEPENDENT_AMBULATORY_CARE_PROVIDER_SITE_OTHER): Payer: BC Managed Care – PPO | Admitting: Family Medicine

## 2020-06-14 ENCOUNTER — Other Ambulatory Visit: Payer: Self-pay

## 2020-06-14 VITALS — BP 118/78 | Ht 66.0 in | Wt 155.0 lb

## 2020-06-14 DIAGNOSIS — M25561 Pain in right knee: Secondary | ICD-10-CM | POA: Diagnosis not present

## 2020-06-14 NOTE — Patient Instructions (Signed)
You have IT band syndrome Ice over area of pain 3-4 times a day for 15 minutes at a time Hip side raise exercise 3 sets of 10 once a day - add weights if this becomes too easy. Stretches - pick 2-3 and hold for 20-30 seconds x 3 - do once or twice a day. Tylenol and/or aleve as needed for pain. Start physical therapy and continue home exercises on days you don't go to therapy. Consider injection if still struggling Follow up with me in 6 weeks.

## 2020-06-14 NOTE — Progress Notes (Signed)
PCP: Leeroy Cha, MD  Subjective:   HPI: Patient is a 59 y.o. female here for follow-up of lateral knee pain. She states she has had some improvement but is not progressing as she would like. She states she is about 90% compliant with the at-home exercises. She was also doing yoga 2-3 times a week and admits she was flexing her knee more than 30 degrees, which caused the pain to flare up, so she stopped doing yoga a few days ago. She also states she walks 40 minutes 3-4 times a week, and she starts feeling the pain about 20 minutes into the walk. She denies pain at rest. She states she has been icing it twice daily and using the Body Helix sleeve, which help. Otherwise, she has not taken any pain medications. Denies numbness or tingling down the leg.  Past Medical History:  Diagnosis Date  . Allergy   . Anemia   . Anxiety   . Arthritis    right knee  . Degenerative disc disease   . Depression   . Family history of early CAD   . Fibromyalgia   . Fibromyalgia   . Hyperlipidemia   . IBS (irritable bowel syndrome)   . Lumbar radiculitis   . Plantar fasciitis of left foot   . PVC (premature ventricular contraction)   . Salmonella poisoning     Current Outpatient Medications on File Prior to Visit  Medication Sig Dispense Refill  . acetic acid 2 % otic solution     . acetic acid-hydrocortisone (VOSOL-HC) otic solution Place 4 drops into both ears daily. Use for one week and then off 3 weeks (Patient not taking: Reported on 08/27/2019) 10 mL 5  . azelastine (ASTELIN) 0.1 % nasal spray Place into the nose.    . B Complex-C-E-Zn (B COMPLEX-C-E-ZINC) tablet Take by mouth.    Marland Kitchen buPROPion (WELLBUTRIN XL) 150 MG 24 hr tablet TK 2 TS PO Q MORNING    . buPROPion (WELLBUTRIN XL) 300 MG 24 hr tablet Take 300 mg by mouth daily.    . Calcium Carbonate-Vitamin D (CALCIUM 500 + D) 500-125 MG-UNIT TABS Take 1 tablet by mouth daily.    . diclofenac (VOLTAREN) 75 MG EC tablet as needed.    Marland Kitchen  estradiol (ESTRACE) 1 MG tablet TAKE 1 TABLET (1 MG TOTAL) BY MOUTH DAILY. (Patient taking differently: Take 1 mg by mouth at bedtime. ) 90 tablet 0  . ferrous sulfate 325 (65 FE) MG tablet Take 325 mg by mouth 3 (three) times a week.     Marland Kitchen FETZIMA 20 MG CP24 Take 40 mg by mouth every other day.     . fish oil-omega-3 fatty acids 1000 MG capsule Take 1 g by mouth daily.    . fluticasone (FLONASE) 50 MCG/ACT nasal spray     . Glucosamine Sulfate 500 MG CAPS Take by mouth.    . mometasone (ELOCON) 0.1 % cream Apply 1 application topically daily.    . Multiple Vitamin (MULITIVITAMIN WITH MINERALS) TABS Take 1 tablet by mouth daily.    Marland Kitchen nystatin-triamcinolone (MYCOLOG II) cream Apply 1 application topically daily.    . Probiotic Product (PROBIOTIC DAILY PO) Take 1 Can by mouth daily.    . progesterone (PROMETRIUM) 100 MG capsule Take 1 capsule (100 mg total) by mouth daily. (Patient taking differently: Take 100 mg by mouth at bedtime. ) 90 capsule 1  . sulindac (CLINORIL) 150 MG tablet Take by mouth.    Marland Kitchen VITAMIN D  PO Take by mouth.     No current facility-administered medications on file prior to visit.    Past Surgical History:  Procedure Laterality Date  . COLONOSCOPY  last 08/16/2014  . EYE SURGERY Right 05/2019   Cataract  . KNEE ARTHROSCOPY    . KNEE ARTHROSCOPY    . laproscopy    . POLYPECTOMY    . TONSILLECTOMY      Allergies  Allergen Reactions  . Colace  [Docusate Calcium] Other (See Comments)    cramping  . Morphine And Related     Other reaction(s): Redness IV  . Other Rash    Medical tape, band aids  . Penicillins Hives and Rash    Did it involve swelling of the face/tongue/throat, SOB, or low BP? No Did it involve sudden or severe rash/hives, skin peeling, or any reaction on the inside of your mouth or nose? No Did you need to seek medical attention at a hospital or doctor's office? No When did it last happen?4 years ago If all above answers are "NO", may  proceed with cephalosporin use.  . Zithromax [Azithromycin] Rash and Hives  . Dulcolax [Bisacodyl]     Dehydration   . Effexor [Venlafaxine]     Intolerant SSRI/SNRI  . Erythromycin Nausea And Vomiting  . Prednisone Other (See Comments)    Paranoid , anxious, hyper emotionally, could not sleep, odd dreams Causes severe anxiety and insomnia.  . Tape Dermatitis    Social History   Socioeconomic History  . Marital status: Married    Spouse name: Not on file  . Number of children: 0  . Years of education: Not on file  . Highest education level: Not on file  Occupational History  . Occupation: Programmer, multimedia: [employer], production: [employer]  Tobacco Use  . Smoking status: Never Smoker  . Smokeless tobacco: Never Used  Vaping Use  . Vaping Use: Never used  Substance and Sexual Activity  . Alcohol use: No    Alcohol/week: 0.0 standard drinks  . Drug use: No  . Sexual activity: Yes    Birth control/protection: Post-menopausal  Other Topics Concern  . Not on file  Social History Narrative  . Not on file   Social Determinants of Health   Financial Resource Strain:   . Difficulty of Paying Living Expenses:   Food Insecurity:   . Worried About Charity fundraiser in the Last Year:   . Arboriculturist in the Last Year:   Transportation Needs:   . Film/video editor (Medical):   Marland Kitchen Lack of Transportation (Non-Medical):   Physical Activity:   . Days of Exercise per Week:   . Minutes of Exercise per Session:   Stress:   . Feeling of Stress :   Social Connections:   . Frequency of Communication with Friends and Family:   . Frequency of Social Gatherings with Friends and Family:   . Attends Religious Services:   . Active Member of Clubs or Organizations:   . Attends Archivist Meetings:   Marland Kitchen Marital Status:   Intimate Partner Violence:   . Fear of Current or Ex-Partner:   . Emotionally Abused:   Marland Kitchen Physically Abused:   . Sexually Abused:      Family History  Problem Relation Age of Onset  . Colon cancer Father   . Esophageal cancer Father   . Cancer Father   . Stroke Father   .  Heart disease Father   . Hypertension Father   . Hyperlipidemia Father   . Diabetes Father   . Prostate cancer Father   . Stomach cancer Father   . Colon polyps Father   . Heart disease Mother   . Arthritis Mother   . Heart attack Mother   . Mental illness Mother   . Diabetes Maternal Grandfather   . Heart disease Maternal Grandfather   . Mental illness Maternal Grandfather   . Stroke Maternal Grandfather   . Hypertension Brother   . Hyperlipidemia Brother   . Cancer Maternal Grandmother        circulatory cancer  . Heart disease Paternal Grandfather   . Diabetes Paternal Grandfather   . Pancreatic cancer Neg Hx   . Rectal cancer Neg Hx     BP 118/78   Ht 5\' 6"  (1.676 m)   Wt 155 lb (70.3 kg)   LMP 10/28/2008   BMI 25.02 kg/m   Review of Systems: See HPI above.     Objective:  Physical Exam:  Gen: NAD, comfortable in exam room  Right knee: No gross deformity, ecchymoses, effusion. TTP over distal IT band at lateral femoral condyle.  No joint line, other tenderness. FROM. Negative ant/post drawers. Negative valgus/varus testing. Negative lachmans. Negative mcmurrays, thessaly, apleys. Positive ober and noble. NV intact distally.   Assessment & Plan:  1. Right knee pain - 2/2 IT band syndrome.  Icing, continue with home exercises.  Tylenol and/or aleve.  Start physical therapy.  F/u in 6 weeks.

## 2020-06-15 ENCOUNTER — Encounter: Payer: Self-pay | Admitting: Family Medicine

## 2020-06-26 ENCOUNTER — Other Ambulatory Visit (HOSPITAL_COMMUNITY): Payer: Self-pay | Admitting: Otolaryngology

## 2020-06-26 MED FILL — FLUTICASONE PROP 50 MCG SPR: 50 | 30 days supply | Qty: 16 | Fill #0

## 2020-07-20 ENCOUNTER — Ambulatory Visit: Payer: BC Managed Care – PPO | Attending: Family Medicine

## 2020-07-20 ENCOUNTER — Other Ambulatory Visit: Payer: Self-pay

## 2020-07-20 DIAGNOSIS — R252 Cramp and spasm: Secondary | ICD-10-CM | POA: Insufficient documentation

## 2020-07-20 DIAGNOSIS — G8929 Other chronic pain: Secondary | ICD-10-CM | POA: Insufficient documentation

## 2020-07-20 DIAGNOSIS — M6281 Muscle weakness (generalized): Secondary | ICD-10-CM | POA: Insufficient documentation

## 2020-07-20 DIAGNOSIS — M25561 Pain in right knee: Secondary | ICD-10-CM | POA: Insufficient documentation

## 2020-07-20 NOTE — Therapy (Signed)
[facility] [address] [place], [place], [address] Phone: [phone]   Fax:  [phone]  Physical Therapy Evaluation  Patient Details  Name: Megan Combs MRN: [id number] Date of Birth: Nov 02, 1960 Referring Provider (PT): Karlton Lemon, MD   Encounter Date: 07/20/2020   PT End of Session - 07/20/20 0832    Visit Number 1    Date for PT Re-Evaluation 09/14/20    Authorization Type BCBS/UMR    PT Start Time 0755    PT Stop Time 0836    PT Time Calculation (min) 41 min    Activity Tolerance Patient tolerated treatment well    Behavior During Therapy [facility] for tasks assessed/performed           Past Medical History:  Diagnosis Date  . Allergy   . Anemia   . Anxiety   . Arthritis    right knee  . Degenerative disc disease   . Depression   . Family history of early CAD   . Fibromyalgia   . Fibromyalgia   . Hyperlipidemia   . IBS (irritable bowel syndrome)   . Lumbar radiculitis   . Plantar fasciitis of left foot   . PVC (premature ventricular contraction)   . Salmonella poisoning     Past Surgical History:  Procedure Laterality Date  . COLONOSCOPY  last 08/16/2014  . EYE SURGERY Right 05/2019   Cataract  . KNEE ARTHROSCOPY    . KNEE ARTHROSCOPY    . laproscopy    . POLYPECTOMY    . TONSILLECTOMY      There were no vitals filed for this visit.    Subjective Assessment - 07/20/20 0753    Subjective Pt presents to PT with Rt knee and lateral leg pain at ITB that began 1.5 months ago.  Pt has lost 20 lbs recently and has become more active.  Pt has been doing Zumba for 40 minutes 3-4x/wk. Pt began to have lateral Rt knee pain and saw the MD.  Pt has been doing HEP issued by MD and has returned to yoga.    Pertinent History fibromyalgia, Rt knee surgery, DDD    Limitations Walking    How long can you walk comfortably? pain in Rt knee after 20 minutes of walking (    Diagnostic tests none     Patient Stated Goals return to Zumba, walk without pain    Currently in Pain? Yes    Pain Score 1     Pain Location Knee    Pain Orientation Right;Lateral    Pain Descriptors / Indicators Burning;Sharp    Pain Type Chronic pain    Pain Onset More than a month ago    Pain Frequency Intermittent    Aggravating Factors  walking, Zumba    Pain Relieving Factors rest, ice              [facility] PT Assessment - 07/20/20 0001      Assessment   Medical Diagnosis acute pain of Rt knee, Rt ITB syndrome    Referring Provider (PT) Karlton Lemon, MD    Onset Date/Surgical Date 05/17/20    Prior Therapy none      Precautions   Precautions None      Restrictions   Weight Bearing Restrictions No      Balance Screen   Has the patient fallen in the past 6 months No    Has the patient had a decrease in activity level because  of a fear of falling?  No    Is the patient reluctant to leave their home because of a fear of falling?  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, yoga, Zumba      Cognition   Overall Cognitive Status Within Functional Limits for tasks assessed      Observation/Other Assessments   Focus on Therapeutic Outcomes (FOTO)  35% limitation      Sensation   Additional Comments single limb stance: Rt>Lt hip instability and pelvic assymmetry      Posture/Postural Control   Posture/Postural Control No significant limitations      ROM / Strength   AROM / PROM / Strength AROM;PROM;Strength      AROM   Overall AROM  Within functional limits for tasks performed    Overall AROM Comments full hip and knee strength      PROM   Overall PROM  Within functional limits for tasks performed      Strength   Overall Strength Deficits    Overall  Strength Comments bil hips 4/5, Lt knee 5/5, Rt knee 4+/5, core 4/5      Palpation   Palpation comment localized palpable tenderness over Rt lateral quads with trigger points, distal ITB and LCL      Transfers   Transfers Independent with all Transfers      Ambulation/Gait   Gait Pattern Lateral hip instability                      Objective measurements completed on examination: See above findings.               PT Education - 07/20/20 0753    Education Details Access Code: [email or web address]    Person(s) Educated Patient    Methods Explanation;Demonstration;Handout    Comprehension Verbalized understanding;Returned demonstration            PT Short Term Goals - 07/20/20 0751      PT SHORT TERM GOAL #1   Title be independent in initial HEP    Time 4    Period Weeks    Status New    Target Date 08/17/20             PT Long Term Goals - 07/20/20 0751      PT LONG TERM GOAL #1   Title be independent in advanced HEP    Time 8    Period Weeks    Status New    Target Date 09/14/20      PT LONG TERM GOAL #2   Title reduce FOTO to < or = to 25% limitation    Baseline --    Time 8    Period Weeks    Status New    Target Date 09/14/20      PT LONG TERM GOAL #3   Title walk 35-40 minutes without increased Rt knee pain    Baseline --    Time 8    Period Weeks    Status New    Target Date 09/14/20      PT LONG TERM GOAL #4   Title demonstrate 4+/5 to 5/5 bil hip strength to improve stability for exercise and endurance  tasks    Baseline --    Time 8    Period Weeks    Status New    Target Date 09/14/20      PT LONG TERM GOAL #5   Title return to [place] without limitation or increased pain    Baseline --    Time 8    Period Weeks    Status New    Target Date 09/14/20                  Plan - 07/20/20 0844    Clinical Impression Statement Pt presents to PT with Rt knee and lateral leg pain with insidious onset.  Pt is active and  walks and did Zumba 3-4/wk for 40 minutes.  Pt reduced her activity and returned to Yoga which has helped. 60-70% overall improvement since the onset.  Pt reports consistent Rt lateral knee pain with walking (usually at the 20 minute mark).   Pt has not returned to [place].  Pt demonstrates hip weakness bilaterally and pelvic instability and hip instability with single limb stance.  Rt knee 4+/5, Lt knee 5/5, bil hips 4/5 strength.  Pt with tenderness and trigger points over Rt lateral quad and distal ITB.  No pain with ROM or strength testing today.  Pt will benefit from skilled PT to address hip strength, tissue mobility to address pain and functional strength and endurance to improve stability to return to exercise without pain.    Personal Factors and Comorbidities Comorbidity 1    Comorbidities fibromyalgia    Examination-Activity Limitations Locomotion Level;Stand    Examination-Participation Restrictions Community Activity;Other    Stability/Clinical Decision Making Stable/Uncomplicated    Clinical Decision Making Low    Rehab Potential Excellent    PT Frequency 2x / week    PT Duration 8 weeks    PT Treatment/Interventions ADLs/Self Care Home Management;Cryotherapy;Electrical Stimulation;Iontophoresis 4mg /ml Dexamethasone;Moist Heat;Neuromuscular re-education;Balance training;Therapeutic exercise;Therapeutic activities;Functional mobility training;Gait training;Patient/family education;Manual techniques;Dry needling;Taping;Vasopneumatic Device    PT Next Visit Plan DN to Rt lateral quad and ITB, review HEP, pelvic and hip stability    PT Home Exercise Plan Access Code: [email or web address]    Consulted and Agree with Plan of Care Patient           Patient will benefit from skilled therapeutic intervention in order to improve the following deficits and impairments:  Decreased activity tolerance, Decreased strength, Decreased balance, Pain, Increased muscle spasms, Difficulty walking  Visit  Diagnosis: Chronic pain of right knee - Plan: PT plan of care cert/re-cert  Cramp and spasm - Plan: PT plan of care cert/re-cert  Muscle weakness (generalized) - Plan: PT plan of care cert/re-cert     Problem List Patient Active Problem List   Diagnosis Date Noted  . Rhinitis, chronic 11/10/2017  . Left ankle injury, subsequent encounter 06/21/2017  . Medication intolerance 07/04/2014  . Family history of early CAD 06/29/2014  . Palpitations 06/29/2014  . Hyperlipidemia   . PVC (premature ventricular contraction)   . Low back pain 06/20/2014  . Right hip pain 02/15/2014  . Right shoulder injury 02/01/2014  . Salmonella 10/27/2013  . Breast density 03/17/2013  . Renal cyst, left 01/06/2013  . Family history of colon cancer 01/03/2013  . Elevated alkaline phosphatase measurement 01/03/2013  . Articular cartilage disorder of knee 12/24/2012  . Plantar fasciitis 07/21/2012  . Anxiety 07/02/2012  . History of anemia 07/01/2012  . Depression 07/01/2012  . Menopausal hot flushes 07/01/2012  . DJD (degenerative joint disease)  07/01/2012  . Vaginal dryness, menopausal 07/01/2012  . Urge incontinence of urine 07/01/2012  . Fibromyalgia   . Family history of malignant neoplasm 12/25/2011  . Personal history of colonic polyps 12/25/2011    Sigurd Sos, PT 07/20/20 10:27 AM   [facility] [address] [place], [place], [address] Phone: [phone]   Fax:  [phone]  Name: Keisha Amer MRN: [id number] Date of Birth: 1961/02/08

## 2020-07-20 NOTE — Patient Instructions (Signed)
Access Code: [email or web address] URL: [email or web address] Date: 07/20/2020 Prepared by: Claiborne Billings  Exercises Seated Hamstring Stretch - 2 x daily - 7 x weekly - 1 sets - 3 reps - 20 hold Supine Bridge - 2 x daily - 7 x weekly - 1-2 sets - 10 reps - 5 hold Forward T - 2 x daily - 7 x weekly - 1 sets - 10 reps Single Leg Stance - 2 x daily - 7 x weekly - 1 sets - 5 reps

## 2020-07-24 ENCOUNTER — Ambulatory Visit: Payer: BC Managed Care – PPO | Admitting: Family Medicine

## 2020-07-25 ENCOUNTER — Ambulatory Visit: Payer: BC Managed Care – PPO

## 2020-07-25 ENCOUNTER — Other Ambulatory Visit: Payer: Self-pay

## 2020-07-25 DIAGNOSIS — R252 Cramp and spasm: Secondary | ICD-10-CM

## 2020-07-25 DIAGNOSIS — M25561 Pain in right knee: Secondary | ICD-10-CM | POA: Diagnosis not present

## 2020-07-25 DIAGNOSIS — G8929 Other chronic pain: Secondary | ICD-10-CM

## 2020-07-25 DIAGNOSIS — M6281 Muscle weakness (generalized): Secondary | ICD-10-CM

## 2020-07-25 NOTE — Therapy (Signed)
[facility] [address] [place], [place], [address] Phone: [phone]   Fax:  [phone]  Physical Therapy Treatment  Patient Details  Name: Megan Combs MRN: [id number] Date of Birth: Mar 20, 1961 Referring Provider (PT): Karlton Lemon, MD   Encounter Date: 07/25/2020   PT End of Session - 07/25/20 0802    Visit Number 2    Date for PT Re-Evaluation 09/14/20    Authorization Type BCBS/UMR    PT Start Time 0732    PT Stop Time 0802    PT Time Calculation (min) 30 min    Activity Tolerance Patient tolerated treatment well    Behavior During Therapy [facility] for tasks assessed/performed           Past Medical History:  Diagnosis Date  . Allergy   . Anemia   . Anxiety   . Arthritis    right knee  . Degenerative disc disease   . Depression   . Family history of early CAD   . Fibromyalgia   . Fibromyalgia   . Hyperlipidemia   . IBS (irritable bowel syndrome)   . Lumbar radiculitis   . Plantar fasciitis of left foot   . PVC (premature ventricular contraction)   . Salmonella poisoning     Past Surgical History:  Procedure Laterality Date  . COLONOSCOPY  last 08/16/2014  . EYE SURGERY Right 05/2019   Cataract  . KNEE ARTHROSCOPY    . KNEE ARTHROSCOPY    . laproscopy    . POLYPECTOMY    . TONSILLECTOMY      There were no vitals filed for this visit.   Subjective Assessment - 07/25/20 0735    Subjective My Rt knee is bothering me a little bit because I ran a short distance over the weekend.    Patient Stated Goals return to Zumba, walk without pain    Currently in Pain? Yes    Pain Score 1     Pain Location Knee    Pain Orientation Right;Lateral    Pain Descriptors / Indicators Sharp;Aching    Pain Type Chronic pain    Pain Onset More than a month ago    Aggravating Factors  walking, being on feet    Pain Relieving Factors rest, ice                              [facility] Adult PT Treatment/Exercise - 07/25/20 0001      Manual Therapy   Manual Therapy Soft tissue mobilization;Myofascial release    Manual therapy comments elongation and release to Rt lateral quad and ITB            Trigger Point Dry Needling - 07/25/20 0001    Consent Given? Yes    Education Handout Provided Previously provided    Muscles Treated Lower Quadrant Quadriceps   lateral Rt quad   Muscles Treated Back/Hip Tensor fascia lata   Rt only   Quadriceps Response Twitch response elicited;Palpable increased muscle length    Tensor Fascia Lata Response Twitch response elicited;Palpable increased muscle length                PT Education - 07/25/20 0738    Education Details DN info    Person(s) Educated Patient    Methods Explanation;Handout    Comprehension Verbalized understanding            PT Short Term Goals - 07/20/20  12      PT SHORT TERM GOAL #1   Title be independent in initial HEP    Time 4    Period Weeks    Status New    Target Date 08/17/20             PT Long Term Goals - 07/20/20 0751      PT LONG TERM GOAL #1   Title be independent in advanced HEP    Time 8    Period Weeks    Status New    Target Date 09/14/20      PT LONG TERM GOAL #2   Title reduce FOTO to < or = to 25% limitation    Baseline --    Time 8    Period Weeks    Status New    Target Date 09/14/20      PT LONG TERM GOAL #3   Title walk 35-40 minutes without increased Rt knee pain    Baseline --    Time 8    Period Weeks    Status New    Target Date 09/14/20      PT LONG TERM GOAL #4   Title demonstrate 4+/5 to 5/5 bil hip strength to improve stability for exercise and endurance tasks    Baseline --    Time 8    Period Weeks    Status New    Target Date 09/14/20      PT LONG TERM GOAL #5   Title return to [place] without limitation or increased pain    Baseline --    Time 8    Period Weeks    Status New    Target Date 09/14/20                  Plan - 07/25/20 0804    Clinical Impression Statement Pt with first time follow-up today.  Pt is compliant with HEP for flexibility and hip strength and stability.  Pt with continued Rt quad and ITB pain and session was spent with dry needing and manual therapy to address tissue tension.  Pt demonstrated improved tissue mobility and reduced tension s/p manual therapy including dry needling.  Pt will continue with hip stability exercises to address instability and improve ability to return to regular exercise program.    PT Frequency 2x / week    PT Duration 8 weeks    PT Treatment/Interventions ADLs/Self Care Home Management;Cryotherapy;Electrical Stimulation;Iontophoresis 4mg /ml Dexamethasone;Moist Heat;Neuromuscular re-education;Balance training;Therapeutic exercise;Therapeutic activities;Functional mobility training;Gait training;Patient/family education;Manual techniques;Dry needling;Taping;Vasopneumatic Device    PT Next Visit Plan assess response to DN and repeat, review HEP    PT Home Exercise Plan Access Code: [email or web address]    Consulted and Agree with Plan of Care Patient           Patient will benefit from skilled therapeutic intervention in order to improve the following deficits and impairments:  Decreased activity tolerance, Decreased strength, Decreased balance, Pain, Increased muscle spasms, Difficulty walking  Visit Diagnosis: Chronic pain of right knee  Cramp and spasm  Muscle weakness (generalized)     Problem List Patient Active Problem List   Diagnosis Date Noted  . Rhinitis, chronic 11/10/2017  . Left ankle injury, subsequent encounter 06/21/2017  . Medication intolerance 07/04/2014  . Family history of early CAD 06/29/2014  . Palpitations 06/29/2014  . Hyperlipidemia   . PVC (premature ventricular contraction)   . Low back pain 06/20/2014  . Right hip pain 02/15/2014  . Right  shoulder injury 02/01/2014  . Salmonella 10/27/2013  . Breast density  03/17/2013  . Renal cyst, left 01/06/2013  . Family history of colon cancer 01/03/2013  . Elevated alkaline phosphatase measurement 01/03/2013  . Articular cartilage disorder of knee 12/24/2012  . Plantar fasciitis 07/21/2012  . Anxiety 07/02/2012  . History of anemia 07/01/2012  . Depression 07/01/2012  . Menopausal hot flushes 07/01/2012  . DJD (degenerative joint disease) 07/01/2012  . Vaginal dryness, menopausal 07/01/2012  . Urge incontinence of urine 07/01/2012  . Fibromyalgia   . Family history of malignant neoplasm 12/25/2011  . Personal history of colonic polyps 12/25/2011     Sigurd Sos, PT 07/25/20 8:05 AM  [facility] [address] [place], [place], [address] Phone: [phone]   Fax:  [phone]  Name: Megan Combs MRN: [id number] Date of Birth: 02/25/1961

## 2020-07-25 NOTE — Patient Instructions (Signed)

## 2020-08-01 ENCOUNTER — Other Ambulatory Visit: Payer: Self-pay

## 2020-08-01 ENCOUNTER — Ambulatory Visit: Payer: BC Managed Care – PPO | Attending: Family Medicine

## 2020-08-01 DIAGNOSIS — M25561 Pain in right knee: Secondary | ICD-10-CM | POA: Diagnosis present

## 2020-08-01 DIAGNOSIS — R252 Cramp and spasm: Secondary | ICD-10-CM | POA: Diagnosis not present

## 2020-08-01 DIAGNOSIS — M6281 Muscle weakness (generalized): Secondary | ICD-10-CM | POA: Insufficient documentation

## 2020-08-01 DIAGNOSIS — G8929 Other chronic pain: Secondary | ICD-10-CM | POA: Diagnosis present

## 2020-08-01 NOTE — Patient Instructions (Signed)
Access Code: [email or web address] URL: [email or web address] Date: 08/01/2020 Prepared by: Claiborne Billings  Exercises  Sit to Stand without Arm Support - 2 x daily - 7 x weekly - 2 sets - 10 reps Forward Step Down with Counter Support at Side - 2 x daily - 7 x weekly - 2 sets - 10 reps

## 2020-08-01 NOTE — Therapy (Signed)
[facility] [address] [place], [place], [address] Phone: [phone]   Fax:  [phone]  Physical Therapy Treatment  Patient Details  Name: Megan Combs MRN: [id number] Date of Birth: 1961-03-06 Referring Provider (PT): Karlton Lemon, MD   Encounter Date: 08/01/2020   PT End of Session - 08/01/20 0814    Visit Number 3    Date for PT Re-Evaluation 09/14/20    Authorization Type BC[employer]    PT Start Time 0731    PT Stop Time 0814    PT Time Calculation (min) 43 min    Activity Tolerance Patient tolerated treatment well    Behavior During Therapy [facility] for tasks assessed/performed           Past Medical History:  Diagnosis Date  . Allergy   . Anemia   . Anxiety   . Arthritis    right knee  . Degenerative disc disease   . Depression   . Family history of early CAD   . Fibromyalgia   . Fibromyalgia   . Hyperlipidemia   . IBS (irritable bowel syndrome)   . Lumbar radiculitis   . Plantar fasciitis of left foot   . PVC (premature ventricular contraction)   . Salmonella poisoning     Past Surgical History:  Procedure Laterality Date  . COLONOSCOPY  last 08/16/2014  . EYE SURGERY Right 05/2019   Cataract  . KNEE ARTHROSCOPY    . KNEE ARTHROSCOPY    . laproscopy    . POLYPECTOMY    . TONSILLECTOMY      There were no vitals filed for this visit.   Subjective Assessment - 08/01/20 0737    Subjective I bought a roller massager for my Rt quad.  I'm doing my exercises.    Pain Score --   2-3/10 "soreness" reported intermittently                            [facility] Adult PT Treatment/Exercise - 08/01/20 0001      Exercises   Exercises Knee/Hip      Knee/Hip Exercises: Standing   Step Down Both;2 sets;10 reps    Step Down Limitations cueing for hip/knee alignment      Knee/Hip Exercises: Seated   Sit to Sand 2 sets;10 reps;without UE support      Manual Therapy    Manual Therapy Soft tissue mobilization;Myofascial release    Manual therapy comments elongation and release to Rt lateral quad and ITB            Trigger Point Dry Needling - 08/01/20 0001    Consent Given? Yes    Education Handout Provided Previously provided    Muscles Treated Lower Quadrant Quadriceps   lateral Rt quad   Muscles Treated Back/Hip Tensor fascia lata   Rt only   Quadriceps Response Twitch response elicited;Palpable increased muscle length    Tensor Fascia Lata Response Twitch response elicited;Palpable increased muscle length                PT Education - 08/01/20 0745    Education Details Access Code: [email or web address]    Person(s) Educated Patient    Methods Explanation;Demonstration;Handout    Comprehension Verbalized understanding;Returned demonstration            PT Short Term Goals - 08/01/20 0738      PT SHORT TERM GOAL #1   Title be independent in initial HEP  Status Achieved      PT SHORT TERM GOAL #2   Title --      PT SHORT TERM GOAL #3   Title --             PT Long Term Goals - 07/20/20 0751      PT LONG TERM GOAL #1   Title be independent in advanced HEP    Time 8    Period Weeks    Status New    Target Date 09/14/20      PT LONG TERM GOAL #2   Title reduce FOTO to < or = to 25% limitation    Baseline --    Time 8    Period Weeks    Status New    Target Date 09/14/20      PT LONG TERM GOAL #3   Title walk 35-40 minutes without increased Rt knee pain    Baseline --    Time 8    Period Weeks    Status New    Target Date 09/14/20      PT LONG TERM GOAL #4   Title demonstrate 4+/5 to 5/5 bil hip strength to improve stability for exercise and endurance tasks    Baseline --    Time 8    Period Weeks    Status New    Target Date 09/14/20      PT LONG TERM GOAL #5   Title return to [place] without limitation or increased pain    Baseline --    Time 8    Period Weeks    Status New    Target Date 09/14/20                  Plan - 08/01/20 0750    Clinical Impression Statement Pt is compliant with HEP for flexibility and hip strength and stability.  Pt with continued Rt quad and ITB pain that is now mild and intermittent.  Pt has a roller now and has been using this for tissue mobility.   Pt demonstrated improved tissue mobility and reduced tension s/p manual therapy including dry needling. PT added to HEP for stability of the Rt LE.  Pt required tactile cues for neutral hip/knee alignment with step-downs and demonstrated lateral hip fatigue after 5 reps of this.  Pt will continue with hip stability exercises to address instability and improve ability to return to regular exercise program.    PT Frequency 2x / week    PT Duration 8 weeks    PT Treatment/Interventions ADLs/Self Care Home Management;Cryotherapy;Electrical Stimulation;Iontophoresis 4mg /ml Dexamethasone;Moist Heat;Neuromuscular re-education;Balance training;Therapeutic exercise;Therapeutic activities;Functional mobility training;Gait training;Patient/family education;Manual techniques;Dry needling;Taping;Vasopneumatic Device    PT Next Visit Plan assess response to DN and repeat, review new HEP    PT Home Exercise Plan Access Code: [email or web address]    Recommended Other Services initial cert is signed    Consulted and Agree with Plan of Care Patient           Patient will benefit from skilled therapeutic intervention in order to improve the following deficits and impairments:  Decreased activity tolerance, Decreased strength, Decreased balance, Pain, Increased muscle spasms, Difficulty walking  Visit Diagnosis: Cramp and spasm  Chronic pain of right knee  Muscle weakness (generalized)     Problem List Patient Active Problem List   Diagnosis Date Noted  . Rhinitis, chronic 11/10/2017  . Left ankle injury, subsequent encounter 06/21/2017  . Medication intolerance 07/04/2014  . Family history of early  CAD 06/29/2014  . Palpitations  06/29/2014  . Hyperlipidemia   . PVC (premature ventricular contraction)   . Low back pain 06/20/2014  . Right hip pain 02/15/2014  . Right shoulder injury 02/01/2014  . Salmonella 10/27/2013  . Breast density 03/17/2013  . Renal cyst, left 01/06/2013  . Family history of colon cancer 01/03/2013  . Elevated alkaline phosphatase measurement 01/03/2013  . Articular cartilage disorder of knee 12/24/2012  . Plantar fasciitis 07/21/2012  . Anxiety 07/02/2012  . History of anemia 07/01/2012  . Depression 07/01/2012  . Menopausal hot flushes 07/01/2012  . DJD (degenerative joint disease) 07/01/2012  . Vaginal dryness, menopausal 07/01/2012  . Urge incontinence of urine 07/01/2012  . Fibromyalgia   . Family history of malignant neoplasm 12/25/2011  . Personal history of colonic polyps 12/25/2011    Sigurd Sos, PT 08/01/20 8:16 AM  [facility] [address] [place], [place], [address] Phone: [phone]   Fax:  [phone]  Name: Megan Combs MRN: [id number] Date of Birth: [birth year]

## 2020-08-02 ENCOUNTER — Ambulatory Visit (HOSPITAL_COMMUNITY): Payer: BC Managed Care – PPO | Attending: Family Medicine

## 2020-08-02 ENCOUNTER — Other Ambulatory Visit: Payer: Self-pay

## 2020-08-02 ENCOUNTER — Ambulatory Visit (HOSPITAL_COMMUNITY)
Admission: EM | Admit: 2020-08-02 | Discharge: 2020-08-02 | Disposition: A | Discharge status: Home / Self Care | Payer: No Typology Code available for payment source | Attending: Family Medicine | Admitting: Family Medicine

## 2020-08-02 ENCOUNTER — Ambulatory Visit (HOSPITAL_COMMUNITY): Payer: Self-pay

## 2020-08-02 ENCOUNTER — Encounter (HOSPITAL_COMMUNITY): Payer: Self-pay

## 2020-08-02 DIAGNOSIS — M542 Cervicalgia: Secondary | ICD-10-CM

## 2020-08-02 DIAGNOSIS — M545 Low back pain, unspecified: Secondary | ICD-10-CM

## 2020-08-02 DIAGNOSIS — W19XXXA Unspecified fall, initial encounter: Secondary | ICD-10-CM

## 2020-08-02 DIAGNOSIS — M21262 Flexion deformity, left knee: Secondary | ICD-10-CM

## 2020-08-02 DIAGNOSIS — S8002XA Contusion of left knee, initial encounter: Secondary | ICD-10-CM

## 2020-08-02 DIAGNOSIS — M25562 Pain in left knee: Secondary | ICD-10-CM | POA: Diagnosis not present

## 2020-08-02 MED ORDER — TIZANIDINE HCL 4 MG PO TABS: 4.0000 mg | ORAL_TABLET | Freq: Four times a day (QID) | ORAL | 0 refills | Status: DC | PRN

## 2020-08-02 MED ORDER — HYDROCODONE-ACETAMINOPHEN 5-325 MG PO TABS: 1.0000 | ORAL_TABLET | Freq: Four times a day (QID) | ORAL | 0 refills | Status: DC | PRN

## 2020-08-02 NOTE — ED Provider Notes (Signed)
[place]    CSN: [id number] Arrival date & time: 08/02/20  1750      History   Chief Complaint Chief Complaint  Patient presents with  . Knee Pain  . Back Pain  . Neck Pain    HPI Megan Combs is a 59 y.o. female.   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  Somehow there was sliced cucumber on the floor.  She did not see this as she was interacting with students.  She stepped on it and slipped and fell onto the linoleum floor.  She landed firmly on her right flexed knee.  She then rolled over to a sitting, leaning back position.  She has severe pain in her left knee.  Unable to fully bear weight.  Unable to bend or extend it fully.  Is bruised with some abrasion.  No pior problems with this knee.   This happened around 4:00 in the afternoon.  Is late evening is progressed she is developing some neck stiffness and back stiffness as well.  This was not present at the time of the injury. When she fell there was a Conservation officer, historic buildings from an orthopedic office at the school helping with football training.  He came and evaluated her knee.  He recommended that she get x-rays to rule out fracture. She did not hit her head or lose consciousness.   Past Medical History:  Diagnosis Date  . Allergy   . Anemia   . Anxiety   . Arthritis    right knee  . Degenerative disc disease   . Depression   . Family history of early CAD   . Fibromyalgia   . Fibromyalgia   . Hyperlipidemia   . IBS (irritable bowel syndrome)   . Lumbar radiculitis   . Plantar fasciitis of left foot   . PVC (premature ventricular contraction)   . Salmonella poisoning     Patient Active Problem List   Diagnosis Date Noted  . Rhinitis, chronic 11/10/2017  . Left ankle injury, subsequent encounter 06/21/2017  . Medication intolerance 07/04/2014  . Family history of early CAD 06/29/2014  . Palpitations 06/29/2014  . Hyperlipidemia   .  PVC (premature ventricular contraction)   . Low back pain 06/20/2014  . Right hip pain 02/15/2014  . Right shoulder injury 02/01/2014  . Salmonella 10/27/2013  . Breast density 03/17/2013  . Renal cyst, left 01/06/2013  . Family history of colon cancer 01/03/2013  . Elevated alkaline phosphatase measurement 01/03/2013  . Articular cartilage disorder of knee 12/24/2012  . Plantar fasciitis 07/21/2012  . Anxiety 07/02/2012  . History of anemia 07/01/2012  . Depression 07/01/2012  . Menopausal hot flushes 07/01/2012  . DJD (degenerative joint disease) 07/01/2012  . Vaginal dryness, menopausal 07/01/2012  . Urge incontinence of urine 07/01/2012  . Fibromyalgia   . Family history of malignant neoplasm 12/25/2011  . Personal history of colonic polyps 12/25/2011    Past Surgical History:  Procedure Laterality Date  . COLONOSCOPY  last 08/16/2014  . EYE SURGERY Right 05/2019   Cataract  . KNEE ARTHROSCOPY    . KNEE ARTHROSCOPY    . laproscopy    . POLYPECTOMY    . TONSILLECTOMY      OB History    Gravida  2   Para      Term      Preterm      AB  1  Living  0     SAB      TAB      Ectopic  1   Multiple      Live Births               Home Medications    Prior to Admission medications   Medication Sig Start Date End Date Taking? Authorizing Provider  acetic acid 2 % otic solution  03/16/19   [provider]  acetic acid-hydrocortisone (VOSOL-HC) otic solution Place 4 drops into both ears daily. Use for one week and then off 3 weeks 07/04/15   Tereasa Coop, PA-C  azelastine (ASTELIN) 0.1 % nasal spray Place into the nose. 11/10/17   [provider]  B Complex-C-E-Zn (B COMPLEX-C-E-ZINC) tablet Take by mouth.    [provider]  buPROPion (WELLBUTRIN XL) 150 MG 24 hr tablet TK 2 TS PO Q MORNING 04/12/19   [provider]  buPROPion (WELLBUTRIN XL) 300 MG 24 hr tablet Take 300 mg by mouth daily.    [provider]  Calcium Carbonate-Vitamin D (CALCIUM 500 + D) 500-125 MG-UNIT TABS Take 1 tablet by mouth daily.    [provider]  diclofenac (VOLTAREN) 75 MG EC tablet as needed. 09/12/15   [provider]  estradiol (ESTRACE) 1 MG tablet TAKE 1 TABLET (1 MG TOTAL) BY MOUTH DAILY. Patient taking differently: Take 1 mg by mouth at bedtime.  10/19/15   Elby Beck, FNP  ferrous sulfate 325 (65 FE) MG tablet Take 325 mg by mouth 3 (three) times a week.     [provider]  FETZIMA 20 MG CP24 Take 40 mg by mouth every other day.  01/04/19   [provider]  fish oil-omega-3 fatty acids 1000 MG capsule Take 1 g by mouth daily.    [provider]  fluticasone Asencion Islam) 50 MCG/ACT nasal spray  05/05/19   [provider]  Glucosamine Sulfate 500 MG CAPS Take by mouth.    [provider]  HYDROcodone-acetaminophen (NORCO/VICODIN) 5-325 MG tablet Take 1-2 tablets by mouth every 6 (six) hours as needed. 08/02/20   Raylene Everts, MD  mometasone (ELOCON) 0.1 % cream Apply 1 application topically daily. 01/06/19   [provider]  Multiple Vitamin (MULITIVITAMIN WITH MINERALS) TABS Take 1 tablet by mouth daily.    [provider]  nystatin-triamcinolone (MYCOLOG II) cream Apply 1 application topically daily. 01/06/19   [provider]  Probiotic Product (PROBIOTIC DAILY PO) Take 1 Can by mouth daily.    [provider]  progesterone (PROMETRIUM) 100 MG capsule Take 100 mg by mouth at bedtime. 05/27/20   [provider]  sulindac (CLINORIL) 150 MG tablet Take by mouth. 06/20/17   [provider]  tiZANidine (ZANAFLEX) 4 MG tablet Take 1-2 tablets (4-8 mg total) by mouth every 6 (six) hours as needed for muscle spasms. 08/02/20   Raylene Everts, MD  VITAMIN D PO Take by mouth.    [provider]    Family History Family History  Problem Relation Age of Onset  . Colon cancer Father   .  Esophageal cancer Father   . Cancer Father   . Stroke Father   . Heart disease Father   . Hypertension Father   . Hyperlipidemia Father   . Diabetes Father   . Prostate cancer Father   . Stomach cancer Father   . Colon polyps Father   . Heart disease Mother   .  Arthritis Mother   . Heart attack Mother   . Mental illness Mother   . Diabetes Maternal Grandfather   . Heart disease Maternal Grandfather   . Mental illness Maternal Grandfather   . Stroke Maternal Grandfather   . Hypertension Brother   . Hyperlipidemia Brother   . Cancer Maternal Grandmother        circulatory cancer  . Heart disease Paternal Grandfather   . Diabetes Paternal Grandfather   . Pancreatic cancer Neg Hx   . Rectal cancer Neg Hx     Social History Social History   Tobacco Use  . Smoking status: Never Smoker  . Smokeless tobacco: Never Used  Vaping Use  . Vaping Use: Never used  Substance Use Topics  . Alcohol use: No    Alcohol/week: 0.0 standard drinks  . Drug use: No     Allergies   Colace  [docusate calcium], Morphine and related, Other, Penicillins, Zithromax [azithromycin], Dulcolax [bisacodyl], Effexor [venlafaxine], Erythromycin, Prednisone, and Tape   Review of Systems Review of Systems See HPI  Physical Exam Triage Vital Signs ED Triage Vitals  Enc Vitals Group     BP 08/02/20 1941 (!) 155/72     Pulse Rate 08/02/20 1941 80     Resp 08/02/20 1941 16     Temp 08/02/20 1941 98 F (36.7 C)     Temp Source 08/02/20 1941 Oral     SpO2 08/02/20 1941 100 %     Weight --      Height --      Head Circumference --      Peak Flow --      Pain Score 08/02/20 1943 10     Pain Loc --      Pain Edu? --      Excl. in [place]? --    No data found.  Updated Vital Signs BP (!) 155/72 (BP Location: Right Arm)   Pulse 80   Temp 98 F (36.7 C) (Oral)   Resp 16   LMP 10/28/2008   SpO2 100%      Physical Exam Constitutional:      General: She is not in acute distress.     Appearance: She is well-developed and normal weight.     Comments: Is using crutches.  Well toe-touch only on the injured left leg.  Moves slowly.  HENT:     Head: Normocephalic and atraumatic.  Eyes:     Conjunctiva/sclera: Conjunctivae normal.     Pupils: Pupils are equal, round, and reactive to light.  Cardiovascular:     Rate and Rhythm: Normal rate.  Pulmonary:     Effort: Pulmonary effort is normal. No respiratory distress.  Abdominal:     Palpations: Abdomen is soft.  Musculoskeletal:        General: Tenderness and signs of injury present.     Cervical back: Normal range of motion.     Comments: Can extend left knee.  Will flex only 10 to 15 degrees.  There is visible bruising superficial abrasion on the anterior knee.  There is soft tissue swelling.  There is effusion.  There is tenderness to palpation of the patella.  Mild tenderness over the medial lateral joint line.  Unable to do good ligamentous instability exam secondary to pain and limitation. Mild tenderness is present bilaterally in the upper body of the trapezius and paraspinous cervical muscles, lumbar muscles.  No palpable muscle spasm.  No palpable bony defect.  Skin:    General:  Skin is warm and dry.  Neurological:     Mental Status: She is alert.      UC Treatments / Results  Labs (all labs ordered are listed, but only abnormal results are displayed) Labs Reviewed - No data to display  EKG   Radiology DG Knee AP/LAT W/[place] Left  Result Date: 08/02/2020 CLINICAL DATA:  [place] on flexed knee EXAM: LEFT KNEE 3 VIEWS COMPARISON:  None. FINDINGS: No definite acute displaced fracture or malalignment. Mild patellofemoral and medial joint space degenerative change. No significant knee effusion. Prepatellar soft tissue swelling. IMPRESSION: Prepatellar soft tissue swelling. No definite acute osseous abnormality. Electronically Signed   By: Donavan Foil M.D.   On: 08/02/2020 20:24    Procedures Procedures  (including critical care time)  Medications Ordered in UC Medications - No data to display  Initial Impression / Assessment and Plan / UC Course  I have reviewed the triage vital signs and the nursing notes.  Pertinent labs & imaging results that were available during my care of the patient were reviewed by me and considered in my medical decision making (see chart for details).    No fracture identified Reviewed conservative management of injuries Reviewed appropriate follow-up and activities  Final Clinical Impressions(s) / UC Diagnoses   Final diagnoses:  Acute pain of left knee  Contusion of left knee, initial encounter  Neck pain, acute  Acute bilateral low back pain without sciatica  Fall, initial encounter     Discharge Instructions     Ice to painful knee for 20 minutes every couple of hours Take ibuprofen for mild to moderate pain Take hydrocodone for severe pain.  Take with food.  Do not drive on hydrocodone Take tizanidine as needed as muscle relaxer.  This is helpful at bedtime. Expect more soreness tomorrow, especially in her neck and back Use crutches for ambulation Call tomorrow to sports medicine for follow-up.  I recommend sports medicine doctors for rehab and recovery of your injuries.  You may need to clear this with your Worker's Comp./employer    ED Prescriptions    Medication Sig Dispense Auth. Provider   HYDROcodone-acetaminophen (NORCO/VICODIN) 5-325 MG tablet Take 1-2 tablets by mouth every 6 (six) hours as needed. 10 tablet Raylene Everts, MD   tiZANidine (ZANAFLEX) 4 MG tablet Take 1-2 tablets (4-8 mg total) by mouth every 6 (six) hours as needed for muscle spasms. 21 tablet Raylene Everts, MD     I have reviewed the PDMP during this encounter.   Raylene Everts, MD 08/02/20 2105

## 2020-08-02 NOTE — Discharge Instructions (Signed)
Ice to painful knee for 20 minutes every couple of hours Take ibuprofen for mild to moderate pain Take hydrocodone for severe pain.  Take with food.  Do not drive on hydrocodone Take tizanidine as needed as muscle relaxer.  This is helpful at bedtime. Expect more soreness tomorrow, especially in her neck and back Use crutches for ambulation Call tomorrow to sports medicine for follow-up.  I recommend sports medicine doctors for rehab and recovery of your injuries.  You may need to clear this with your Worker's Comp./employer

## 2020-08-02 NOTE — ED Triage Notes (Signed)
Pt present she slipped on a cucumber and fall today and injured her left knee.  She is also having some neck and back pain from falling on the floor.

## 2020-08-04 ENCOUNTER — Ambulatory Visit: Payer: BC Managed Care – PPO | Admitting: Family Medicine

## 2020-08-08 ENCOUNTER — Ambulatory Visit: Payer: BC Managed Care – PPO

## 2020-08-21 MED FILL — buPROPion HCL ER (XL) 300 M: 300 | 90 days supply | Qty: 90 | Fill #1

## 2020-08-22 ENCOUNTER — Ambulatory Visit: Payer: BC Managed Care – PPO

## 2020-08-22 ENCOUNTER — Other Ambulatory Visit: Payer: Self-pay

## 2020-08-22 DIAGNOSIS — M6281 Muscle weakness (generalized): Secondary | ICD-10-CM

## 2020-08-22 DIAGNOSIS — R252 Cramp and spasm: Secondary | ICD-10-CM | POA: Diagnosis not present

## 2020-08-22 DIAGNOSIS — G8929 Other chronic pain: Secondary | ICD-10-CM

## 2020-08-22 NOTE — Therapy (Signed)
[facility] [address] [place], [place], [address] Phone: [phone]   Fax:  [phone]  Physical Therapy Treatment  Patient Details  Name: Megan Combs MRN: [id number] Date of Birth: 1961/09/07 Referring Provider (PT): Karlton Lemon, MD   Encounter Date: 08/22/2020   PT End of Session - 08/22/20 0802    Visit Number 4    Authorization Type BCBS/UMR    PT Start Time 0730    PT Stop Time 0802    PT Time Calculation (min) 32 min    Activity Tolerance Patient tolerated treatment well    Behavior During Therapy [facility] for tasks assessed/performed           Past Medical History:  Diagnosis Date  . Allergy   . Anemia   . Anxiety   . Arthritis    right knee  . Degenerative disc disease   . Depression   . Family history of early CAD   . Fibromyalgia   . Fibromyalgia   . Hyperlipidemia   . IBS (irritable bowel syndrome)   . Lumbar radiculitis   . Plantar fasciitis of left foot   . PVC (premature ventricular contraction)   . Salmonella poisoning     Past Surgical History:  Procedure Laterality Date  . COLONOSCOPY  last 08/16/2014  . EYE SURGERY Right 05/2019   Cataract  . KNEE ARTHROSCOPY    . KNEE ARTHROSCOPY    . laproscopy    . POLYPECTOMY    . TONSILLECTOMY      There were no vitals filed for this visit.   Subjective Assessment - 08/22/20 0736    Subjective Rt leg and ITB feels better.  Pt had a fall on 08/02/20 and landed on the Lt knee.  Pt is being treated by Dr Rhona Raider for this and is working on approval for PT for this. 2nd x-ray was negative for patellar fracture.  Gait is antaligic.    Pertinent History fibromyalgia, Rt knee surgery, DDD    Patient Stated Goals return to Zumba, walk without pain    Currently in Pain? Yes    Pain Score 0-No pain   intermittent due to increased weightbearing on the Rt   Pain Location Knee    Pain Orientation Right    Pain Descriptors /  Indicators Sore    Pain Type Chronic pain    Pain Onset More than a month ago    Pain Frequency Intermittent    Aggravating Factors  no real pain in the Rt Lt    Pain Relieving Factors rest, ice              [facility] PT Assessment - 08/22/20 0001      Assessment   Medical Diagnosis acute pain of Rt knee, Rt ITB syndrome    Referring Provider (PT) Karlton Lemon, MD    Onset Date/Surgical Date 05/17/20      Cognition   Overall Cognitive Status Within Functional Limits for tasks assessed      Observation/Other Assessments   Focus on Therapeutic Outcomes (FOTO)  37% limitation      Posture/Postural Control   Posture/Postural Control No significant limitations                         [facility] Adult PT Treatment/Exercise - 08/22/20 0001      Knee/Hip Exercises: Stretches   Active Hamstring Stretch Both;3 reps;20 seconds      Knee/Hip Exercises:  Standing   Step Down 2 sets;10 reps;Right    Other Standing Knee Exercises T kick x10                  PT Education - 08/22/20 0805    Education Details review of HEP that she can complete on Rt LE    Methods Explanation;Demonstration    Comprehension Verbalized understanding;Returned demonstration            PT Short Term Goals - 08/01/20 3086      PT SHORT TERM GOAL #1   Title be independent in initial HEP    Status Achieved      PT SHORT TERM GOAL #2   Title --      PT SHORT TERM GOAL #3   Title --             PT Long Term Goals - 08/22/20 0746      PT LONG TERM GOAL #1   Title be independent in advanced HEP    Status Achieved      PT LONG TERM GOAL #3   Title walk 35-40 minutes without increased Rt knee pain    Baseline deferred due to recent injury to Lt knee    Status Deferred      PT LONG TERM GOAL #4   Title demonstrate 4+/5 to 5/5 bil hip strength to improve stability for exercise and endurance tasks    Baseline deferred due to recent injury    Status Deferred      PT LONG  TERM GOAL #5   Title return to [place] without limitation or increased pain    Status Deferred                 Plan - 08/22/20 0759    Clinical Impression Statement Pt with lapse in treatment to due fall on 08/02/20 at work resulting in injury to Lt knee and shoulder.  PT spent session advising on current exercises that she can do for her Rt LE strength and stability that won't interfere with her Lt LE injury from her recent fall.  Pt will begin PT at another facility for her injury at it is worker's comp.  Pt will D/C today to HEP for Rt LE strength and flexibility.  Most goals deferred due to Lt LE injury limiting current function.    PT Next Visit Plan D/C to HEP    PT Home Exercise Plan Access Code: [email or web address]    Consulted and Agree with Plan of Care Patient           Patient will benefit from skilled therapeutic intervention in order to improve the following deficits and impairments:     Visit Diagnosis: Chronic pain of right knee  Cramp and spasm  Muscle weakness (generalized)     Problem List Patient Active Problem List   Diagnosis Date Noted  . Rhinitis, chronic 11/10/2017  . Left ankle injury, subsequent encounter 06/21/2017  . Medication intolerance 07/04/2014  . Family history of early CAD 06/29/2014  . Palpitations 06/29/2014  . Hyperlipidemia   . PVC (premature ventricular contraction)   . Low back pain 06/20/2014  . Right hip pain 02/15/2014  . Right shoulder injury 02/01/2014  . Salmonella 10/27/2013  . Breast density 03/17/2013  . Renal cyst, left 01/06/2013  . Family history of colon cancer 01/03/2013  . Elevated alkaline phosphatase measurement 01/03/2013  . Articular cartilage disorder of knee 12/24/2012  . Plantar fasciitis 07/21/2012  . Anxiety 07/02/2012  .  History of anemia 07/01/2012  . Depression 07/01/2012  . Menopausal hot flushes 07/01/2012  . DJD (degenerative joint disease) 07/01/2012  . Vaginal dryness, menopausal 07/01/2012  .  Urge incontinence of urine 07/01/2012  . Fibromyalgia   . Family history of malignant neoplasm 12/25/2011  . Personal history of colonic polyps 12/25/2011   PHYSICAL THERAPY DISCHARGE SUMMARY  Visits from Start of Care: 4  Current functional level related to goals / functional outcomes: See above for current status.  Pt had injury to Lt LE and shoulder and will begin PT for this at another facility.     Remaining deficits: Pt with Rt LE instability.  Pain has improved overall.     Education / Equipment: HEP Plan: Patient agrees to discharge.  Patient goals were partially met. Patient is being discharged due to a change in medical status.  ?????        Sigurd Sos, PT 08/22/20 8:07 AM  [facility] [address] [place], [place], [address] Phone: [phone]   Fax:  [phone]  Name: Megan Combs MRN: [id number] Date of Birth: Jul 28, 1961

## 2020-08-26 ENCOUNTER — Ambulatory Visit: Payer: BC Managed Care – PPO

## 2020-08-29 MED FILL — PROGESTERONE 100 MG CAPS: 100 | 90 days supply | Qty: 90 | Fill #1

## 2020-08-29 MED FILL — ESTRADIOL 1 MG TABS: 1 | 90 days supply | Qty: 90 | Fill #1

## 2020-09-09 ENCOUNTER — Ambulatory Visit: Payer: BC Managed Care – PPO | Attending: Internal Medicine

## 2020-09-09 DIAGNOSIS — Z23 Encounter for immunization: Secondary | ICD-10-CM

## 2020-09-09 NOTE — Progress Notes (Signed)
   Covid-19 Vaccination Clinic  Name:  Megan Combs    MRN: [id number] DOB: [birth year]  09/09/2020  Ms. Bascomb was observed post Covid-19 immunization for 30 minutes based on pre-vaccination screening without incident. She was provided with Vaccine Information Sheet and instruction to access the V-Safe system.   Ms. Kercheval was instructed to call 911 with any severe reactions post vaccine: Marland Kitchen Difficulty breathing  . Swelling of face and throat  . A fast heartbeat  . A bad rash all over body  . Dizziness and weakness   Immunizations Administered    No immunizations on file.

## 2020-10-03 MED FILL — FLUTICASONE PROP 50 MCG SPR: 50 | 30 days supply | Qty: 16 | Fill #1

## 2020-11-11 MED FILL — buPROPion HCL ER (XL) 300 M: 300 | 90 days supply | Qty: 90 | Fill #1

## 2020-11-13 ENCOUNTER — Other Ambulatory Visit (HOSPITAL_COMMUNITY): Payer: Self-pay | Admitting: Psychiatry

## 2020-11-13 MED FILL — NORTRIPTYLINE HCL CAP 25 MG: 25 | 34 days supply | Qty: 60 | Fill #0

## 2020-11-14 ENCOUNTER — Other Ambulatory Visit: Payer: Self-pay | Admitting: Internal Medicine

## 2020-11-14 DIAGNOSIS — Z1231 Encounter for screening mammogram for malignant neoplasm of breast: Secondary | ICD-10-CM

## 2020-12-05 MED FILL — PROGESTERONE 100 MG CAPS: 100 | 90 days supply | Qty: 90 | Fill #2

## 2020-12-05 MED FILL — ESTRADIOL 1 MG TABS: 1 | 90 days supply | Qty: 90 | Fill #2

## 2020-12-14 ENCOUNTER — Other Ambulatory Visit (HOSPITAL_COMMUNITY): Payer: Self-pay | Admitting: Psychiatry

## 2020-12-14 MED FILL — FETZIMA ER 20 MG CAPSULE: 20 | 7 days supply | Qty: 7 | Fill #0

## 2020-12-21 ENCOUNTER — Other Ambulatory Visit (HOSPITAL_COMMUNITY): Payer: Self-pay | Admitting: Psychiatry

## 2020-12-21 MED FILL — buPROPion HCL ER (XL) 150 M: 150 | 30 days supply | Qty: 30 | Fill #0

## 2021-01-01 ENCOUNTER — Ambulatory Visit
Admission: RE | Admit: 2021-01-01 | Discharge: 2021-01-01 | Disposition: A | Discharge status: Home / Self Care | Payer: BC Managed Care – PPO | Source: Ambulatory Visit | Attending: Internal Medicine | Admitting: Internal Medicine

## 2021-01-01 ENCOUNTER — Other Ambulatory Visit: Payer: Self-pay

## 2021-01-01 DIAGNOSIS — Z1231 Encounter for screening mammogram for malignant neoplasm of breast: Secondary | ICD-10-CM

## 2021-02-13 ENCOUNTER — Other Ambulatory Visit (HOSPITAL_COMMUNITY): Payer: Self-pay

## 2021-02-13 MED FILL — Bupropion HCl Tab ER 24HR 300 MG: ORAL | 90 days supply | Qty: 90 | Fill #0 | Status: AC

## 2021-02-15 ENCOUNTER — Other Ambulatory Visit (HOSPITAL_COMMUNITY): Payer: Self-pay

## 2021-02-26 MED FILL — Estradiol Tab 1 MG: ORAL | 90 days supply | Qty: 90 | Fill #0 | Status: AC

## 2021-02-26 MED FILL — Progesterone Cap 100 MG: ORAL | 90 days supply | Qty: 90 | Fill #0 | Status: AC

## 2021-02-27 ENCOUNTER — Other Ambulatory Visit (HOSPITAL_COMMUNITY): Payer: Self-pay

## 2021-03-09 MED FILL — Fluticasone Propionate Nasal Susp 50 MCG/ACT: NASAL | 30 days supply | Qty: 16 | Fill #0 | Status: AC

## 2021-03-10 ENCOUNTER — Other Ambulatory Visit (HOSPITAL_COMMUNITY): Payer: Self-pay

## 2021-03-13 ENCOUNTER — Other Ambulatory Visit (HOSPITAL_COMMUNITY): Payer: Self-pay

## 2021-03-13 MED ORDER — BUPROPION HCL ER (XL) 300 MG PO TB24
ORAL_TABLET | ORAL | 1 refills | Status: DC
  Filled 2021-03-13 – 2021-08-24 (×2): qty 90, 90d supply, fill #0
  Filled 2021-11-20: qty 90, 90d supply, fill #1

## 2021-04-02 ENCOUNTER — Ambulatory Visit: Payer: BC Managed Care – PPO | Admitting: Sports Medicine

## 2021-04-05 ENCOUNTER — Ambulatory Visit: Payer: BC Managed Care – PPO | Admitting: Sports Medicine

## 2021-04-05 ENCOUNTER — Other Ambulatory Visit (HOSPITAL_COMMUNITY): Payer: Self-pay

## 2021-04-05 ENCOUNTER — Other Ambulatory Visit: Payer: Self-pay

## 2021-04-05 VITALS — BP 114/76 | Ht 66.0 in | Wt 151.0 lb

## 2021-04-05 DIAGNOSIS — Q667 Congenital pes cavus, unspecified foot: Secondary | ICD-10-CM

## 2021-04-05 DIAGNOSIS — M722 Plantar fascial fibromatosis: Secondary | ICD-10-CM

## 2021-04-05 MED ORDER — IBUPROFEN 800 MG PO TABS
800.0000 mg | ORAL_TABLET | Freq: Two times a day (BID) | ORAL | 0 refills | Status: DC | PRN
  Filled 2021-04-05: qty 40, 20d supply, fill #0

## 2021-04-05 NOTE — Progress Notes (Signed)
   Office Visit Note   Patient: Megan Combs           Date of Birth: 1961-04-06           MRN: [id number] Visit Date: 04/05/2021 Requested by: Leeroy Cha, MD [address] [place],  [place] [address] PCP: Leeroy Cha, MD  Subjective: CC: Right heel pain  HPI: 60 year old female with past medical history of pes cavus and remote history of plantar fasciitis, who is presenting to clinic today with concerns of 2 to 3 months of right heel pain.  She states that she was participating in a dementia simulation exercise in which she put very uncomfortable orthotics in her shoes.  Immediately following this, she started to experience pain on the bottom of her right foot which is worse upon initial steps in the morning.  She spoke with a physical therapist at her healthy weight clinic, who suspected she had Planter fasciitis and gave her some initial rehabilitation exercises.  She has been doing these (resisted plantar flexion, toe crawls, tennis ball rolls) without much improvement.  She continues to endorse severe pain over the medial portion of the anterior calcaneus.  No pain within the medial ankle, no numbness or tingling of the foot.  She has insoles for her flexible pes cavus, and states that these are new and typically very well-tolerated.  She has no additional concerns today.               Objective: Vital Signs: BP 114/76   Ht 5\' 6"  (1.676 m)   Wt 151 lb (68.5 kg)   LMP 10/28/2008   BMI 24.37 kg/m  No flowsheet data found.   No flowsheet data found.  Physical Exam:  General:  Alert and oriented, in no acute distress. Pulm:  Breathing unlabored. Psy:  Normal mood, congruent affect. Skin: Bilateral feet without bruises, rashes, or erythema. Overlying skin intact.  Bilateral foot exam: Flexible pes cavus bilaterally with evidence of lateral foot breakdown and transverse arch collapse.  When standing, she does have collapse of the longitudinal  arch bilaterally.  Appropriate posterior tibialis function with heel rise. Significant tenderness to palpation over the medial aspect of the anterior calcaneus.  No pain with calcaneal squeeze.  No pain throughout midfoot, or over the fifth metatarsal or navicular bones. No pain with Tinel's over tarsal tunnel. 5 out of 5 strength with ankle dorsi and plantar flexion, as well as inversion and eversion. Sensation intact through all toes. Brisk capillary refill.  Imaging: None today.   Assessment & Plan: 60 year old female presented to clinic with right heel pain consistent with plantar fasciitis for the past 2 to 3 months.  Suspect she indeed aggravated this with the uncomfortable insoles during her work simulation experience. -We will escalate her home exercises to include longitudinal arch stretch as well as calf stretching. -Discussed frozen water bottle massage. -Provided with handout information for night socks. -Suspect this should increase within the next 3 to 4 weeks, however if this is not the case she is to return to clinic.  Would consider formal physical therapy referral at that time. -Patient expresses understanding with plan.  She had no further questions or concerns today.   Patient seen and evaluated with the sports medicine fellow.  I agree with the above plan of care.  Treatment as above for Planter fasciitis.  If symptoms persist, consider formal physical therapy.  Follow-up for ongoing or recalcitrant issues.

## 2021-04-13 ENCOUNTER — Other Ambulatory Visit (HOSPITAL_COMMUNITY): Payer: Self-pay

## 2021-04-13 MED ORDER — BENZONATATE 200 MG PO CAPS
200.0000 mg | ORAL_CAPSULE | Freq: Three times a day (TID) | ORAL | 0 refills | Status: DC
  Filled 2021-04-13: qty 21, 7d supply, fill #0

## 2021-04-13 MED ORDER — ALBUTEROL SULFATE HFA 108 (90 BASE) MCG/ACT IN AERS
INHALATION_SPRAY | RESPIRATORY_TRACT | 0 refills | Status: DC
Start: 2021-04-13 — End: 2024-02-18
  Filled 2021-04-13: qty 18, 25d supply, fill #0

## 2021-04-17 ENCOUNTER — Other Ambulatory Visit (HOSPITAL_COMMUNITY): Payer: Self-pay

## 2021-04-17 MED ORDER — NYSTATIN 100000 UNIT/GM EX OINT
TOPICAL_OINTMENT | CUTANEOUS | 0 refills | Status: AC
  Filled 2021-04-17: qty 15, 7d supply, fill #0

## 2021-04-17 MED ORDER — ESTRADIOL 1 MG PO TABS
ORAL_TABLET | ORAL | 2 refills | Status: DC
  Filled 2021-04-17 – 2021-07-25 (×2): qty 45, 90d supply, fill #0
  Filled 2021-10-15: qty 45, 90d supply, fill #1
  Filled 2022-01-14: qty 45, 90d supply, fill #2

## 2021-04-17 MED ORDER — MOMETASONE FUROATE 0.1 % EX CREA
TOPICAL_CREAM | CUTANEOUS | 1 refills | Status: DC
Start: 2021-04-17 — End: 2023-12-04
  Filled 2021-04-17: qty 15, 7d supply, fill #0

## 2021-04-17 MED ORDER — PROGESTERONE MICRONIZED 100 MG PO CAPS
100.0000 mg | ORAL_CAPSULE | ORAL | 3 refills | Status: DC
  Filled 2021-04-17 – 2021-07-25 (×2): qty 45, 90d supply, fill #0
  Filled 2021-10-15: qty 45, 90d supply, fill #1
  Filled 2022-01-14: qty 45, 90d supply, fill #2
  Filled 2022-04-15: qty 45, 90d supply, fill #3

## 2021-04-18 ENCOUNTER — Other Ambulatory Visit (HOSPITAL_COMMUNITY): Payer: Self-pay

## 2021-05-14 MED FILL — Bupropion HCl Tab ER 24HR 300 MG: ORAL | 90 days supply | Qty: 90 | Fill #1 | Status: AC

## 2021-05-15 ENCOUNTER — Other Ambulatory Visit (HOSPITAL_COMMUNITY): Payer: Self-pay

## 2021-05-24 ENCOUNTER — Other Ambulatory Visit (HOSPITAL_COMMUNITY): Payer: Self-pay

## 2021-05-24 MED ORDER — TRIAMCINOLONE ACETONIDE 0.5 % EX CREA
TOPICAL_CREAM | CUTANEOUS | 0 refills | Status: AC
  Filled 2021-05-24: qty 15, 7d supply, fill #0

## 2021-05-24 MED ORDER — PREDNISONE 20 MG PO TABS
ORAL_TABLET | ORAL | 0 refills | Status: DC
Start: 2021-05-24 — End: 2023-12-04
  Filled 2021-05-24: qty 7, 6d supply, fill #0

## 2021-05-29 MED FILL — Fluticasone Propionate Nasal Susp 50 MCG/ACT: NASAL | 30 days supply | Qty: 16 | Fill #1 | Status: AC

## 2021-05-30 ENCOUNTER — Other Ambulatory Visit (HOSPITAL_COMMUNITY): Payer: Self-pay

## 2021-06-12 ENCOUNTER — Other Ambulatory Visit (HOSPITAL_COMMUNITY): Payer: Self-pay

## 2021-06-12 MED ORDER — FLUTICASONE PROPIONATE 50 MCG/ACT NA SUSP
NASAL | 11 refills | Status: DC
Start: 2021-06-12 — End: 2023-05-23
  Filled 2021-06-12: qty 16, 30d supply, fill #0

## 2021-06-12 MED ORDER — HYDROCORTISONE-ACETIC ACID 1-2 % OT SOLN
OTIC | 2 refills | Status: DC
  Filled 2021-06-12: qty 10, 20d supply, fill #0
  Filled 2022-01-16: qty 10, 20d supply, fill #1

## 2021-06-21 ENCOUNTER — Other Ambulatory Visit (HOSPITAL_COMMUNITY): Payer: Self-pay

## 2021-07-03 ENCOUNTER — Other Ambulatory Visit (HOSPITAL_COMMUNITY): Payer: Self-pay

## 2021-07-03 MED ORDER — TRETINOIN 0.025 % EX CREA
TOPICAL_CREAM | CUTANEOUS | 1 refills | Status: DC
  Filled 2021-07-03 – 2021-07-09 (×2): qty 45, 30d supply, fill #0

## 2021-07-06 ENCOUNTER — Other Ambulatory Visit (HOSPITAL_COMMUNITY): Payer: Self-pay

## 2021-07-09 ENCOUNTER — Other Ambulatory Visit (HOSPITAL_COMMUNITY): Payer: Self-pay

## 2021-07-10 ENCOUNTER — Other Ambulatory Visit (HOSPITAL_COMMUNITY): Payer: Self-pay

## 2021-07-11 ENCOUNTER — Other Ambulatory Visit (HOSPITAL_COMMUNITY): Payer: Self-pay

## 2021-07-12 ENCOUNTER — Other Ambulatory Visit (HOSPITAL_COMMUNITY): Payer: Self-pay

## 2021-07-12 MED ORDER — DOXYCYCLINE HYCLATE 100 MG PO CAPS
ORAL_CAPSULE | ORAL | 0 refills | Status: DC
  Filled 2021-07-12: qty 20, 10d supply, fill #0

## 2021-07-12 MED ORDER — BENZONATATE 200 MG PO CAPS
ORAL_CAPSULE | ORAL | 0 refills | Status: DC
  Filled 2021-07-12: qty 21, 7d supply, fill #0

## 2021-07-16 ENCOUNTER — Other Ambulatory Visit (HOSPITAL_COMMUNITY): Payer: Self-pay | Admitting: Psychiatry

## 2021-07-16 ENCOUNTER — Other Ambulatory Visit (HOSPITAL_COMMUNITY): Payer: Self-pay

## 2021-07-16 MED ORDER — ESTRADIOL 10 MCG VA TABS
ORAL_TABLET | VAGINAL | 2 refills | Status: DC
  Filled 2021-07-16: qty 8, 30d supply, fill #0
  Filled 2021-10-18: qty 8, 30d supply, fill #1
  Filled 2022-02-04: qty 8, 30d supply, fill #2

## 2021-07-19 ENCOUNTER — Other Ambulatory Visit (HOSPITAL_COMMUNITY): Payer: Self-pay

## 2021-07-25 ENCOUNTER — Other Ambulatory Visit (HOSPITAL_COMMUNITY): Payer: Self-pay

## 2021-07-27 ENCOUNTER — Other Ambulatory Visit (HOSPITAL_COMMUNITY): Payer: Self-pay

## 2021-07-27 ENCOUNTER — Other Ambulatory Visit: Payer: Self-pay | Admitting: Otolaryngology

## 2021-07-27 MED ORDER — MUPIROCIN 2 % EX OINT
TOPICAL_OINTMENT | CUTANEOUS | 0 refills | Status: DC
  Filled 2021-07-27: qty 22, 30d supply, fill #0

## 2021-08-24 ENCOUNTER — Other Ambulatory Visit (HOSPITAL_COMMUNITY): Payer: Self-pay

## 2021-08-24 MED ORDER — NYSTATIN 100000 UNIT/GM EX CREA
TOPICAL_CREAM | CUTANEOUS | 6 refills | Status: DC
  Filled 2021-08-24: qty 30, 10d supply, fill #0
  Filled 2022-04-21: qty 30, 10d supply, fill #1

## 2021-08-24 MED ORDER — KETOCONAZOLE 2 % EX SHAM
MEDICATED_SHAMPOO | CUTANEOUS | 1 refills | Status: DC
  Filled 2021-08-24: qty 120, 30d supply, fill #0

## 2021-09-13 ENCOUNTER — Other Ambulatory Visit (HOSPITAL_COMMUNITY): Payer: Self-pay

## 2021-09-13 MED ORDER — LEVOMILNACIPRAN HCL ER 20 MG PO CP24
ORAL_CAPSULE | ORAL | 1 refills | Status: DC
  Filled 2021-09-13 – 2021-09-27 (×2): qty 14, 14d supply, fill #0

## 2021-09-27 ENCOUNTER — Other Ambulatory Visit (HOSPITAL_COMMUNITY): Payer: Self-pay

## 2021-10-15 ENCOUNTER — Other Ambulatory Visit (HOSPITAL_COMMUNITY): Payer: Self-pay

## 2021-10-15 MED ORDER — LEVOMILNACIPRAN HCL ER 20 MG PO CP24
20.0000 mg | ORAL_CAPSULE | Freq: Every morning | ORAL | 1 refills | Status: DC
  Filled 2021-10-15: qty 90, 90d supply, fill #0
  Filled 2022-01-16: qty 30, 30d supply, fill #1
  Filled 2022-01-22 (×2): qty 14, 14d supply, fill #1
  Filled 2022-02-04: qty 14, 14d supply, fill #2
  Filled 2022-02-18: qty 2, 2d supply, fill #3
  Filled 2022-02-19: qty 12, 12d supply, fill #3
  Filled 2022-03-06: qty 14, 14d supply, fill #4

## 2021-10-16 ENCOUNTER — Other Ambulatory Visit (HOSPITAL_BASED_OUTPATIENT_CLINIC_OR_DEPARTMENT_OTHER): Payer: Self-pay

## 2021-10-16 ENCOUNTER — Ambulatory Visit: Payer: BC Managed Care – PPO | Attending: Internal Medicine

## 2021-10-16 ENCOUNTER — Other Ambulatory Visit (HOSPITAL_COMMUNITY): Payer: Self-pay

## 2021-10-16 DIAGNOSIS — Z23 Encounter for immunization: Secondary | ICD-10-CM

## 2021-10-16 MED ORDER — PFIZER COVID-19 VAC BIVALENT 30 MCG/0.3ML IM SUSP
INTRAMUSCULAR | 0 refills | Status: DC
  Filled 2021-10-16: qty 0.3, 1d supply, fill #0

## 2021-10-16 NOTE — Progress Notes (Signed)
° °  Covid-19 Vaccination Clinic  Name:  Megan Combs    MRN: [id number] DOB: 1961-10-05  10/16/2021  [name] was observed post Covid-19 immunization for 30 minutes based on pre-vaccination screening without incident. She was provided with Vaccine Information Sheet and instruction to access the V-Safe system.   [name] was instructed to call 911 with any severe reactions post vaccine: Difficulty breathing  Swelling of face and throat  A fast heartbeat  A bad rash all over body  Dizziness and weakness   Immunizations Administered     Name Date Dose VIS Date Route   Pfizer Covid-19 Vaccine Bivalent Booster 10/16/2021 10:06 AM 0.3 mL 06/27/2021 Intramuscular   Manufacturer: [place]   Lot: [id number]   [place]: [phone]

## 2021-10-18 ENCOUNTER — Other Ambulatory Visit (HOSPITAL_COMMUNITY): Payer: Self-pay

## 2021-11-21 ENCOUNTER — Other Ambulatory Visit (HOSPITAL_COMMUNITY): Payer: Self-pay

## 2021-12-17 ENCOUNTER — Other Ambulatory Visit (HOSPITAL_COMMUNITY): Payer: Self-pay

## 2021-12-24 ENCOUNTER — Other Ambulatory Visit (HOSPITAL_COMMUNITY): Payer: Self-pay

## 2021-12-24 MED ORDER — BUPROPION HCL ER (XL) 300 MG PO TB24
ORAL_TABLET | ORAL | 1 refills | Status: DC
  Filled 2021-12-24 – 2022-02-24 (×2): qty 90, 90d supply, fill #0
  Filled 2022-06-05: qty 90, 90d supply, fill #1

## 2022-01-10 ENCOUNTER — Other Ambulatory Visit (HOSPITAL_BASED_OUTPATIENT_CLINIC_OR_DEPARTMENT_OTHER): Payer: Self-pay

## 2022-01-15 ENCOUNTER — Other Ambulatory Visit (HOSPITAL_COMMUNITY): Payer: Self-pay

## 2022-01-16 ENCOUNTER — Other Ambulatory Visit (HOSPITAL_COMMUNITY): Payer: Self-pay

## 2022-01-17 ENCOUNTER — Other Ambulatory Visit (HOSPITAL_COMMUNITY): Payer: Self-pay

## 2022-01-22 ENCOUNTER — Other Ambulatory Visit (HOSPITAL_COMMUNITY): Payer: Self-pay

## 2022-01-23 ENCOUNTER — Other Ambulatory Visit (HOSPITAL_COMMUNITY): Payer: Self-pay

## 2022-02-05 ENCOUNTER — Other Ambulatory Visit (HOSPITAL_COMMUNITY): Payer: Self-pay

## 2022-02-06 ENCOUNTER — Ambulatory Visit: Payer: BC Managed Care – PPO | Admitting: Family Medicine

## 2022-02-06 ENCOUNTER — Ambulatory Visit
Admission: RE | Admit: 2022-02-06 | Discharge: 2022-02-06 | Disposition: A | Discharge status: Home / Self Care | Payer: BC Managed Care – PPO | Source: Ambulatory Visit | Attending: Sports Medicine | Admitting: Sports Medicine

## 2022-02-06 ENCOUNTER — Other Ambulatory Visit (HOSPITAL_COMMUNITY): Payer: Self-pay

## 2022-02-06 VITALS — BP 112/84 | Ht 66.0 in | Wt 146.0 lb

## 2022-02-06 DIAGNOSIS — M25551 Pain in right hip: Secondary | ICD-10-CM

## 2022-02-06 NOTE — Patient Instructions (Signed)
Thank you for coming to see me today. It was a pleasure. Today we talked about:  ? ?I have placed an order for x-rays of your hip.  Please go to [facility] to have this completed.  You do not need an appointment.  We will contact you with your results afterwards.  ? ?It looks like you have some slight tearing and strain within your hip rotators.  We will give you some exercises that will help with this.  Time will also help.  You can take ibuprofen as needed for the pain. ? ?Please follow-up with [place] in 4 to 6 weeks, but if your pain has completely resolved, you do not need to come in. ? ?If you have any questions or concerns, please do not hesitate to call the office at [phone]. ? ?Best,  ? ?Arizona Constable, DO ?[place]  ?

## 2022-02-06 NOTE — Progress Notes (Signed)
PCP: Leeroy Cha, MD ? ?Subjective:  ? ?HPI: ?Patient is a 61 y.o. female here for right hip pain after a fall. ? ?Three weeks ago patient was carrying a tray and tripped going up two stairs. She fell twisting and thinks she landed directly on her right glute, not directly on bone. It didn't initially bother her. Got a massage 2 days later and had intense pain with massage of the right glute and lateral hip. The hip continued to not bother her with walking but did feel tight in that region. She tried heating the area every other day for a couple of weeks. Got another massage last week and was very painful again so she decided to get it evaluated. Denies any leg weakness or pain with movements. Denies any bruising or obvious swelling. No history of recent fractures. Had a normal bone density scan 8 months ago. Does report a remote hx of bursitis in the right lateral hip.   ? ?Past Medical History:  ?Diagnosis Date  ? Allergy   ? Anemia   ? Anxiety   ? Arthritis   ? right knee  ? Degenerative disc disease   ? Depression   ? Family history of early CAD   ? Fibromyalgia   ? Fibromyalgia   ? Hyperlipidemia   ? IBS (irritable bowel syndrome)   ? Lumbar radiculitis   ? Plantar fasciitis of left foot   ? PVC (premature ventricular contraction)   ? Salmonella poisoning   ? ? ?Current Outpatient Medications on File Prior to Visit  ?Medication Sig Dispense Refill  ? acetic acid 2 % otic solution     ? acetic acid-hydrocortisone (VOSOL-HC) otic solution Place 4 drops into both ears daily. Use for one week and then off 3 weeks 10 mL 5  ? acetic acid-hydrocortisone (VOSOL-HC) OTIC solution Place 4 drops in each ear each night for 2 weeks then as needed for itching. 10 mL 2  ? albuterol (PROAIR HFA) 108 (90 Base) MCG/ACT inhaler Inhale 2 puffs by mouth every 6 hours as needed 18 g 0  ? azelastine (ASTELIN) 0.1 % nasal spray Place into the nose.    ? B Complex-C-E-Zn (B COMPLEX-C-E-ZINC) tablet Take by mouth.    ?  benzonatate (TESSALON) 200 MG capsule Take 1 capsule by mouth three times a day as needed for cough 21 capsule 0  ? buPROPion (WELLBUTRIN XL) 150 MG 24 hr tablet TK 2 TS PO Q MORNING    ? buPROPion (WELLBUTRIN XL) 150 MG 24 hr tablet TAKE 1 TABLET BY MOUTH EVERY MORNING WITH 300 MG TABLET 30 tablet 0  ? buPROPion (WELLBUTRIN XL) 300 MG 24 hr tablet Take 300 mg by mouth daily.    ? buPROPion (WELLBUTRIN XL) 300 MG 24 hr tablet TAKE 1 TABLET BY MOUTH EVERY MORNING 90 tablet 1  ? buPROPion (WELLBUTRIN XL) 300 MG 24 hr tablet TAKE 1 TABLET BY MOUTH EVERY MORNING 90 tablet 1  ? buPROPion (WELLBUTRIN XL) 300 MG 24 hr tablet TAKE 1 TABLET BY MOUTH EVERY MORNING 90 tablet 1  ? buPROPion (WELLBUTRIN XL) 300 MG 24 hr tablet Take 1 tablet by mouth every morning. 90 tablet 1  ? Calcium Carbonate-Vitamin D (CALCIUM 500 + D) 500-125 MG-UNIT TABS Take 1 tablet by mouth daily.    ? COVID-19 mRNA bivalent vaccine, Pfizer, (PFIZER COVID-19 VAC BIVALENT) injection Inject into the muscle. 0.3 mL 0  ? diclofenac (VOLTAREN) 75 MG EC tablet as needed.    ?  doxycycline (VIBRAMYCIN) 100 MG capsule Take 1 capsule by mouth twice a day for 10 days 20 capsule 0  ? estradiol (ESTRACE) 1 MG tablet TAKE 1 TABLET (1 MG TOTAL) BY MOUTH DAILY. (Patient taking differently: Take 1 mg by mouth at bedtime. ) 90 tablet 0  ? estradiol (ESTRACE) 1 MG tablet Take 1/2 tablet by mouth daily. 45 tablet 2  ? Estradiol (VAGIFEM) 10 MCG TABS vaginal tablet Insert 1 tablet vaginally two times a Week 8 tablet 2  ? ferrous sulfate 325 (65 FE) MG tablet Take 325 mg by mouth 3 (three) times a week.     ? FETZIMA 20 MG CP24 Take 40 mg by mouth every other day.     ? fish oil-omega-3 fatty acids 1000 MG capsule Take 1 g by mouth daily.    ? fluticasone (FLONASE) 50 MCG/ACT nasal spray     ? fluticasone (FLONASE) 50 MCG/ACT nasal spray USE 2 SPRAYS IN EACH NOSTRIL EACH NIGHT 16 g 11  ? fluticasone (FLONASE) 50 MCG/ACT nasal spray Use 2 sprays in each nostril at bedtime.  16 g 11  ? Glucosamine Sulfate 500 MG CAPS Take by mouth.    ? HYDROcodone-acetaminophen (NORCO/VICODIN) 5-325 MG tablet Take 1-2 tablets by mouth every 6 (six) hours as needed. 10 tablet 0  ? ibuprofen (ADVIL) 800 MG tablet Take 1 tablet (800 mg total) by mouth 2 (two) times daily as needed. 40 tablet 0  ? ketoconazole (NIZORAL) 2 % shampoo Use externally as directed once a day for 28 days. 120 mL 1  ? Levomilnacipran HCl ER 20 MG CP24 Take 1 capsule by mouth once a day 14 capsule 1  ? Levomilnacipran HCl ER 20 MG CP24 Take 1 capsule (20 mg) by mouth every morning. 90 capsule 1  ? mometasone (ELOCON) 0.1 % cream Apply 1 application topically daily.    ? mometasone (ELOCON) 0.1 % cream Apply to affected area 2 times a day for 7 days 15 g 1  ? Multiple Vitamin (MULITIVITAMIN WITH MINERALS) TABS Take 1 tablet by mouth daily.    ? mupirocin ointment (BACTROBAN) 2 % Apply to left nostril twice daily for 1 week as directed. 22 g 0  ? nortriptyline (PAMELOR) 25 MG capsule TAKE 1 CAPSULE BY MOUTH AT BEDTIME FOR 1 WEEK, THEN TAKE 2 CAPSULES AT BEDTIME. 60 capsule 4  ? nortriptyline (PAMELOR) 25 MG capsule TAKE 1 CAPSULE BY MOUTH AT BEDTIME 30 capsule 4  ? nystatin cream (MYCOSTATIN) Apply to affected area 2 times a day 30 g 6  ? nystatin ointment (MYCOSTATIN) Apply to affected area 2 times a day for 7 days 15 g 0  ? nystatin-triamcinolone (MYCOLOG II) cream Apply 1 application topically daily.    ? predniSONE (DELTASONE) 20 MG tablet Take 2 tablets daily for 2 days, then 1 tablet daily for 2 days, then 1/2 tablet daily for 2 days.  Take with food or milk 7 tablet 0  ? Probiotic Product (PROBIOTIC DAILY PO) Take 1 Can by mouth daily.    ? progesterone (PROMETRIUM) 100 MG capsule Take 100 mg by mouth at bedtime.    ? progesterone (PROMETRIUM) 100 MG capsule Take 1 capsule (100 mg total) by mouth every other day. 45 capsule 3  ? sulindac (CLINORIL) 150 MG tablet Take by mouth.    ? tiZANidine (ZANAFLEX) 4 MG tablet Take 1-2  tablets (4-8 mg total) by mouth every 6 (six) hours as needed for muscle spasms. 21 tablet 0  ?  tretinoin (RETIN-A) 0.025 % cream Apply a pea sized amount to the face once daily in the evening 45 g 1  ? triamcinolone cream (KENALOG) 0.5 % Apply 1 application twice a day for 7 days 15 g 0  ? VITAMIN D PO Take by mouth.    ? ?No current facility-administered medications on file prior to visit.  ? ? ?Past Surgical History:  ?Procedure Laterality Date  ? COLONOSCOPY  last 08/16/2014  ? EYE SURGERY Right 05/2019  ? Cataract  ? KNEE ARTHROSCOPY    ? KNEE ARTHROSCOPY    ? laproscopy    ? POLYPECTOMY    ? TONSILLECTOMY    ? ? ?Allergies  ?Allergen Reactions  ? Colace  [Docusate Calcium] Other (See Comments)  ?  cramping  ? Morphine And Related   ?  Other reaction(s): Redness ?IV  ? Other Rash  ?  Medical tape, band aids  ? Penicillins Hives and Rash  ?  Did it involve swelling of the face/tongue/throat, SOB, or low BP? No ?Did it involve sudden or severe rash/hives, skin peeling, or any reaction on the inside of your mouth or nose? No ?Did you need to seek medical attention at a hospital or doctor's office? No ?When did it last happen?      4 years ago ?If all above answers are ?NO?, may proceed with cephalosporin use.  ? Zithromax [Azithromycin] Rash and Hives  ? Dulcolax [Bisacodyl]   ?  Dehydration   ? Effexor [Venlafaxine]   ?  Intolerant SSRI/SNRI  ? Erythromycin Nausea And Vomiting  ? Prednisone Other (See Comments)  ?  Paranoid , anxious, hyper emotionally, could not sleep, odd dreams ?Causes severe anxiety and insomnia.  ? Tape Dermatitis  ? ? ?BP 112/84   Ht '5\' 6"'$  (1.676 m)   Wt 146 lb (66.2 kg)   LMP 10/28/2008   BMI 23.57 kg/m?  ? ? ?  02/06/2022  ?  3:23 PM  ?[place] Adult Exercise  ?Frequency of aerobic exercise (# of days/week) 2  ?Average time in minutes 15  ?Frequency of strengthening activities (# of days/week) 1  ? ? ?   ? View : No data to display.  ?  ?  ?  ? ? ?    ?Objective:   ?Physical Exam: ?Gen: NAD, comfortable in exam room ?CV: Regular rate, well perfused ?Resp: No increased work of breathing, coughing or wheezing ?Psych: Normal mood and affect.  ?Right hip: No visible bruising or deformi

## 2022-02-08 ENCOUNTER — Encounter: Payer: Self-pay | Admitting: Family Medicine

## 2022-02-19 ENCOUNTER — Other Ambulatory Visit (HOSPITAL_COMMUNITY): Payer: Self-pay

## 2022-02-20 ENCOUNTER — Other Ambulatory Visit (HOSPITAL_COMMUNITY): Payer: Self-pay

## 2022-02-25 ENCOUNTER — Other Ambulatory Visit (HOSPITAL_COMMUNITY): Payer: Self-pay

## 2022-03-07 ENCOUNTER — Other Ambulatory Visit (HOSPITAL_COMMUNITY): Payer: Self-pay

## 2022-03-13 ENCOUNTER — Ambulatory Visit: Payer: BC Managed Care – PPO | Admitting: Family Medicine

## 2022-03-15 ENCOUNTER — Other Ambulatory Visit (HOSPITAL_COMMUNITY): Payer: Self-pay

## 2022-03-15 MED ORDER — DOXYCYCLINE MONOHYDRATE 100 MG PO CAPS
ORAL_CAPSULE | ORAL | 0 refills | Status: DC
  Filled 2022-03-15: qty 14, 7d supply, fill #0

## 2022-03-18 ENCOUNTER — Other Ambulatory Visit (HOSPITAL_COMMUNITY): Payer: Self-pay

## 2022-03-18 MED ORDER — BUPROPION HCL ER (XL) 300 MG PO TB24
ORAL_TABLET | ORAL | 1 refills | Status: DC
  Filled 2022-03-18: qty 90, 90d supply, fill #0

## 2022-03-18 MED ORDER — FETZIMA 20 MG PO CP24
ORAL_CAPSULE | ORAL | 1 refills | Status: DC
  Filled 2022-03-18: qty 90, 90d supply, fill #0

## 2022-03-20 ENCOUNTER — Other Ambulatory Visit (HOSPITAL_COMMUNITY): Payer: Self-pay

## 2022-03-20 ENCOUNTER — Ambulatory Visit: Payer: BC Managed Care – PPO | Admitting: Family Medicine

## 2022-03-20 VITALS — BP 124/82 | Ht 66.0 in | Wt 154.0 lb

## 2022-03-20 DIAGNOSIS — M25551 Pain in right hip: Secondary | ICD-10-CM

## 2022-03-20 NOTE — Progress Notes (Unsigned)
   Megan Combs is a 61 y.o. female who presents to [facility] today for the following:  Right hip pain follow-up At last visit on 4/12 had an ultrasound suggestive of possible slight tearing within the hip rotators X-ray was negative for fracture Given home exercise program 80% improved  Has been doing home exercises Has not done a lot of her usual activities and has held off on starting pure barre because she is wanted to make sure that everything is doing okay at her follow-up today Mostly feels some tightness in the area after she is working out, but has noticed significant improvement   PMH reviewed.  ROS as above. Medications reviewed.  Exam:  BP 124/82   Ht '5\' 6"'$  (1.676 m)   Wt 154 lb (69.9 kg)   LMP 10/28/2008   BMI 24.86 kg/m  Gen: Well NAD MSK:  Right Hip:  - Inspection: No gross deformity, no swelling, erythema, or ecchymosis b/l - Palpation: Mild TTP over greater trochanter and hip rotators - ROM: Normal range of motion on Flexion, extension, abduction, internal and external rotation b/l - Strength: Normal strength in all fields b/l - Neuro/vasc: NV intact distally b/l - Special Tests: Negative FABER and FADIR b/l.  No significant pain with figure-of-four stretch.   No results found.   Assessment and Plan: 1) Right hip pain Making significant improvement.  We will have her continue with her home exercises.  In the meantime, she can start to progress her activities, but recommended that pain be her guide as she is getting back to activity.  If she is not making continual improvement over the next 4 to 6 weeks, would recommend follow-up and likely repeat ultrasound.  In the meantime, she would like to decrease her ibuprofen usage, recommended using Voltaren gel on the area.   [place] Constable, D.O.  PGY-4 [facility]  03/20/2022 4:35 PM  Addendum:  I was the preceptor for this visit and available for immediate consultation.  Karlton Lemon  MD [facility]

## 2022-03-20 NOTE — Assessment & Plan Note (Signed)
Making significant improvement.  We will have her continue with her home exercises.  In the meantime, she can start to progress her activities, but recommended that pain be her guide as she is getting back to activity.  If she is not making continual improvement over the next 4 to 6 weeks, would recommend follow-up and likely repeat ultrasound.  In the meantime, she would like to decrease her ibuprofen usage, recommended using Voltaren gel on the area.

## 2022-03-21 ENCOUNTER — Other Ambulatory Visit (HOSPITAL_COMMUNITY): Payer: Self-pay

## 2022-03-22 ENCOUNTER — Other Ambulatory Visit (HOSPITAL_COMMUNITY): Payer: Self-pay

## 2022-04-15 ENCOUNTER — Other Ambulatory Visit (HOSPITAL_COMMUNITY): Payer: Self-pay

## 2022-04-16 ENCOUNTER — Other Ambulatory Visit (HOSPITAL_COMMUNITY): Payer: Self-pay

## 2022-04-16 MED ORDER — ESTRADIOL 1 MG PO TABS
ORAL_TABLET | ORAL | 1 refills | Status: DC
  Filled 2022-04-16: qty 45, 90d supply, fill #0
  Filled 2022-07-21: qty 15, 30d supply, fill #1
  Filled 2022-08-28: qty 15, 30d supply, fill #2
  Filled 2022-09-29: qty 15, 30d supply, fill #3

## 2022-04-19 ENCOUNTER — Other Ambulatory Visit (HOSPITAL_BASED_OUTPATIENT_CLINIC_OR_DEPARTMENT_OTHER): Payer: Self-pay

## 2022-04-22 ENCOUNTER — Other Ambulatory Visit (HOSPITAL_COMMUNITY): Payer: Self-pay

## 2022-04-29 ENCOUNTER — Other Ambulatory Visit (HOSPITAL_COMMUNITY): Payer: Self-pay

## 2022-05-14 ENCOUNTER — Other Ambulatory Visit (HOSPITAL_COMMUNITY): Payer: Self-pay

## 2022-05-15 ENCOUNTER — Other Ambulatory Visit (HOSPITAL_COMMUNITY): Payer: Self-pay

## 2022-05-15 MED ORDER — ESTRADIOL 10 MCG VA TABS
ORAL_TABLET | VAGINAL | 2 refills | Status: DC
  Filled 2022-05-15: qty 8, 28d supply, fill #0

## 2022-05-16 ENCOUNTER — Other Ambulatory Visit (HOSPITAL_COMMUNITY): Payer: Self-pay

## 2022-06-06 ENCOUNTER — Other Ambulatory Visit (HOSPITAL_COMMUNITY): Payer: Self-pay

## 2022-07-02 ENCOUNTER — Other Ambulatory Visit (HOSPITAL_COMMUNITY): Payer: Self-pay

## 2022-07-02 MED ORDER — LEVOMILNACIPRAN HCL ER 20 MG PO CP24
ORAL_CAPSULE | ORAL | 0 refills | Status: DC
  Filled 2022-07-02: qty 30, 30d supply, fill #0
  Filled 2022-07-28: qty 30, 30d supply, fill #1
  Filled 2022-08-30: qty 25, 25d supply, fill #2

## 2022-07-03 ENCOUNTER — Other Ambulatory Visit (HOSPITAL_COMMUNITY): Payer: Self-pay

## 2022-07-03 MED ORDER — MELOXICAM 15 MG PO TABS
15.0000 mg | ORAL_TABLET | Freq: Every day | ORAL | 0 refills | Status: DC
Start: 2022-07-03 — End: 2023-12-04
  Filled 2022-07-03: qty 30, 30d supply, fill #0

## 2022-07-04 ENCOUNTER — Ambulatory Visit: Payer: BC Managed Care – PPO | Admitting: Sports Medicine

## 2022-07-07 ENCOUNTER — Other Ambulatory Visit (HOSPITAL_COMMUNITY): Payer: Self-pay

## 2022-07-09 ENCOUNTER — Other Ambulatory Visit (HOSPITAL_COMMUNITY): Payer: Self-pay

## 2022-07-09 MED ORDER — PROGESTERONE MICRONIZED 100 MG PO CAPS
100.0000 mg | ORAL_CAPSULE | ORAL | 3 refills | Status: DC
  Filled 2022-07-09: qty 15, 30d supply, fill #0

## 2022-07-15 ENCOUNTER — Other Ambulatory Visit (HOSPITAL_COMMUNITY): Payer: Self-pay

## 2022-07-15 MED ORDER — FETZIMA 20 MG PO CP24
20.0000 mg | ORAL_CAPSULE | Freq: Every morning | ORAL | 2 refills | Status: DC
  Filled 2022-07-15 – 2022-10-02 (×2): qty 30, 30d supply, fill #0
  Filled 2022-11-01: qty 30, 30d supply, fill #1

## 2022-07-15 MED ORDER — BUPROPION HCL ER (XL) 300 MG PO TB24
ORAL_TABLET | Freq: Every morning | ORAL | 2 refills | Status: DC
  Filled 2022-07-15: qty 30, 30d supply, fill #0
  Filled 2022-08-28: qty 30, 30d supply, fill #1
  Filled 2022-11-03: qty 30, 30d supply, fill #2
  Filled 2022-12-04: qty 30, 30d supply, fill #3
  Filled 2023-01-05: qty 30, 30d supply, fill #4

## 2022-07-16 ENCOUNTER — Other Ambulatory Visit (HOSPITAL_COMMUNITY): Payer: Self-pay

## 2022-07-17 ENCOUNTER — Ambulatory Visit: Payer: BC Managed Care – PPO | Admitting: Family Medicine

## 2022-07-22 ENCOUNTER — Other Ambulatory Visit (HOSPITAL_COMMUNITY): Payer: Self-pay

## 2022-07-29 ENCOUNTER — Other Ambulatory Visit (HOSPITAL_COMMUNITY): Payer: Self-pay

## 2022-07-29 MED ORDER — ESTRADIOL 0.5 MG PO TABS
0.2500 mg | ORAL_TABLET | ORAL | 4 refills | Status: DC
Start: 2022-07-29 — End: 2023-12-04
  Filled 2022-07-29: qty 7, 28d supply, fill #0
  Filled 2022-10-30: qty 7, 28d supply, fill #1
  Filled 2022-11-03 – 2022-11-08 (×2): qty 7, 28d supply, fill #2

## 2022-07-29 MED ORDER — ESTRADIOL 10 MCG VA TABS
ORAL_TABLET | VAGINAL | 4 refills | Status: DC
  Filled 2022-07-29: qty 8, 28d supply, fill #0
  Filled 2023-01-05: qty 8, 28d supply, fill #1
  Filled 2023-04-30: qty 8, 28d supply, fill #2

## 2022-07-29 MED ORDER — NYSTATIN 100000 UNIT/GM EX OINT
TOPICAL_OINTMENT | CUTANEOUS | 0 refills | Status: DC
  Filled 2022-07-29: qty 15, 7d supply, fill #0

## 2022-07-30 ENCOUNTER — Other Ambulatory Visit (HOSPITAL_COMMUNITY): Payer: Self-pay

## 2022-07-30 MED ORDER — PROGESTERONE MICRONIZED 100 MG PO CAPS
100.0000 mg | ORAL_CAPSULE | ORAL | 3 refills | Status: DC
  Filled 2022-07-30 – 2022-08-13 (×2): qty 15, 30d supply, fill #0
  Filled 2022-09-11: qty 15, 30d supply, fill #1
  Filled 2022-09-29 – 2022-10-04 (×2): qty 15, 30d supply, fill #2
  Filled 2022-11-03: qty 15, 30d supply, fill #3
  Filled 2022-12-04: qty 15, 30d supply, fill #4
  Filled 2023-01-05: qty 15, 30d supply, fill #5
  Filled 2023-02-06: qty 15, 30d supply, fill #6
  Filled 2023-03-09: qty 15, 30d supply, fill #7
  Filled 2023-03-31: qty 15, 30d supply, fill #8
  Filled 2023-05-06: qty 15, 30d supply, fill #9
  Filled 2023-05-28 – 2023-05-29 (×2): qty 15, 30d supply, fill #10
  Filled 2023-07-15: qty 15, 30d supply, fill #11

## 2022-07-31 ENCOUNTER — Other Ambulatory Visit (HOSPITAL_COMMUNITY): Payer: Self-pay

## 2022-08-01 ENCOUNTER — Other Ambulatory Visit (HOSPITAL_COMMUNITY): Payer: Self-pay

## 2022-08-03 ENCOUNTER — Other Ambulatory Visit (HOSPITAL_COMMUNITY): Payer: Self-pay

## 2022-08-05 ENCOUNTER — Other Ambulatory Visit (HOSPITAL_COMMUNITY): Payer: Self-pay

## 2022-08-05 MED ORDER — DOXYCYCLINE HYCLATE 100 MG PO TBEC
100.0000 mg | DELAYED_RELEASE_TABLET | Freq: Two times a day (BID) | ORAL | 0 refills | Status: DC
  Filled 2022-08-05: qty 14, 7d supply, fill #0

## 2022-08-06 ENCOUNTER — Other Ambulatory Visit (HOSPITAL_COMMUNITY): Payer: Self-pay

## 2022-08-06 MED ORDER — FLUTICASONE PROPIONATE 50 MCG/ACT NA SUSP
2.0000 | Freq: Every evening | NASAL | 11 refills | Status: DC
  Filled 2022-08-06: qty 16, 30d supply, fill #0
  Filled 2022-11-08: qty 16, 30d supply, fill #1
  Filled 2022-12-26: qty 16, 30d supply, fill #2
  Filled 2023-02-06: qty 16, 30d supply, fill #3
  Filled 2023-03-16: qty 16, 30d supply, fill #4
  Filled 2023-05-23 (×2): qty 16, 30d supply, fill #5

## 2022-08-06 MED ORDER — DOXYCYCLINE HYCLATE 100 MG PO TABS
100.0000 mg | ORAL_TABLET | Freq: Two times a day (BID) | ORAL | 0 refills | Status: DC
  Filled 2022-08-06: qty 14, 7d supply, fill #0

## 2022-08-14 ENCOUNTER — Other Ambulatory Visit (HOSPITAL_COMMUNITY): Payer: Self-pay

## 2022-08-21 ENCOUNTER — Other Ambulatory Visit: Payer: Self-pay | Admitting: Internal Medicine

## 2022-08-21 DIAGNOSIS — Z1231 Encounter for screening mammogram for malignant neoplasm of breast: Secondary | ICD-10-CM

## 2022-08-23 ENCOUNTER — Other Ambulatory Visit (HOSPITAL_COMMUNITY): Payer: Self-pay

## 2022-08-28 ENCOUNTER — Other Ambulatory Visit (HOSPITAL_COMMUNITY): Payer: Self-pay

## 2022-08-31 ENCOUNTER — Other Ambulatory Visit (HOSPITAL_COMMUNITY): Payer: Self-pay

## 2022-09-03 ENCOUNTER — Other Ambulatory Visit (HOSPITAL_COMMUNITY): Payer: Self-pay

## 2022-09-05 ENCOUNTER — Other Ambulatory Visit (HOSPITAL_COMMUNITY): Payer: Self-pay

## 2022-09-11 ENCOUNTER — Other Ambulatory Visit (HOSPITAL_COMMUNITY): Payer: Self-pay

## 2022-09-12 ENCOUNTER — Other Ambulatory Visit (HOSPITAL_BASED_OUTPATIENT_CLINIC_OR_DEPARTMENT_OTHER): Payer: Self-pay

## 2022-09-12 ENCOUNTER — Other Ambulatory Visit (HOSPITAL_COMMUNITY): Payer: Self-pay

## 2022-09-12 MED ORDER — HYDROCORTISONE-ACETIC ACID 1-2 % OT SOLN
4.0000 [drp] | Freq: Every evening | OTIC | 2 refills | Status: AC
  Filled 2022-09-12 – 2022-10-06 (×2): qty 10, 25d supply, fill #0
  Filled 2022-11-08: qty 10, 25d supply, fill #1

## 2022-09-13 ENCOUNTER — Other Ambulatory Visit (HOSPITAL_COMMUNITY): Payer: Self-pay

## 2022-09-14 ENCOUNTER — Other Ambulatory Visit (HOSPITAL_BASED_OUTPATIENT_CLINIC_OR_DEPARTMENT_OTHER): Payer: Self-pay

## 2022-09-14 ENCOUNTER — Other Ambulatory Visit (HOSPITAL_COMMUNITY): Payer: Self-pay

## 2022-09-14 MED ORDER — DOXYCYCLINE HYCLATE 100 MG PO TABS
100.0000 mg | ORAL_TABLET | Freq: Two times a day (BID) | ORAL | 0 refills | Status: AC
  Filled 2022-09-14: qty 14, 7d supply, fill #0

## 2022-09-14 MED ORDER — PREDNISONE 20 MG PO TABS
40.0000 mg | ORAL_TABLET | Freq: Every day | ORAL | 0 refills | Status: AC
  Filled 2022-09-14: qty 10, 5d supply, fill #0

## 2022-09-14 MED ORDER — LIDOCAINE VISCOUS HCL 2 % MT SOLN
5.0000 mL | OROMUCOSAL | 0 refills | Status: DC
  Filled 2022-09-14: qty 100, 2d supply, fill #0

## 2022-09-14 MED ORDER — ALBUTEROL SULFATE HFA 108 (90 BASE) MCG/ACT IN AERS
2.0000 | INHALATION_SPRAY | RESPIRATORY_TRACT | 1 refills | Status: DC
  Filled 2022-09-14: qty 6.7, 17d supply, fill #0

## 2022-09-14 MED ORDER — MOLNUPIRAVIR 200 MG PO CAPS
4.0000 | ORAL_CAPSULE | Freq: Two times a day (BID) | ORAL | 0 refills | Status: AC
  Filled 2022-09-14: qty 40, 5d supply, fill #0

## 2022-09-14 MED ORDER — PAXLOVID (300/100) 20 X 150 MG & 10 X 100MG PO TBPK
3.0000 | ORAL_TABLET | Freq: Two times a day (BID) | ORAL | 0 refills | Status: DC
  Filled 2022-09-14 (×2): qty 30, 5d supply, fill #0

## 2022-09-17 ENCOUNTER — Other Ambulatory Visit (HOSPITAL_COMMUNITY): Payer: Self-pay

## 2022-09-23 ENCOUNTER — Emergency Department (HOSPITAL_COMMUNITY): Payer: Self-pay

## 2022-09-23 ENCOUNTER — Emergency Department (HOSPITAL_COMMUNITY)
Admission: EM | Admit: 2022-09-23 | Discharge: 2022-09-23 | Disposition: A | Discharge status: Home / Self Care | Payer: 59 | Attending: Emergency Medicine | Admitting: Emergency Medicine

## 2022-09-23 ENCOUNTER — Other Ambulatory Visit: Payer: Self-pay

## 2022-09-23 DIAGNOSIS — E119 Type 2 diabetes mellitus without complications: Secondary | ICD-10-CM | POA: Insufficient documentation

## 2022-09-23 DIAGNOSIS — R5383 Other fatigue: Secondary | ICD-10-CM | POA: Insufficient documentation

## 2022-09-23 DIAGNOSIS — R42 Dizziness and giddiness: Secondary | ICD-10-CM | POA: Insufficient documentation

## 2022-09-23 DIAGNOSIS — F410 Panic disorder [episodic paroxysmal anxiety] without agoraphobia: Secondary | ICD-10-CM | POA: Diagnosis not present

## 2022-09-23 DIAGNOSIS — Z8616 Personal history of COVID-19: Secondary | ICD-10-CM | POA: Insufficient documentation

## 2022-09-23 DIAGNOSIS — R531 Weakness: Secondary | ICD-10-CM | POA: Insufficient documentation

## 2022-09-23 DIAGNOSIS — R4789 Other speech disturbances: Secondary | ICD-10-CM | POA: Insufficient documentation

## 2022-09-23 DIAGNOSIS — I10 Essential (primary) hypertension: Secondary | ICD-10-CM | POA: Insufficient documentation

## 2022-09-23 LAB — CBC
HCT: 40.6 % (ref 36.0–46.0)
Hemoglobin: 13.8 g/dL (ref 12.0–15.0)
MCH: 30.7 pg (ref 26.0–34.0)
MCHC: 34 g/dL (ref 30.0–36.0)
MCV: 90.2 fL (ref 80.0–100.0)
Platelets: 236 10*3/uL (ref 150–400)
RBC: 4.5 MIL/uL (ref 3.87–5.11)
RDW: 12 % (ref 11.5–15.5)
WBC: 9.5 10*3/uL (ref 4.0–10.5)
nRBC: 0 % (ref 0.0–0.2)

## 2022-09-23 LAB — COMPREHENSIVE METABOLIC PANEL
ALT: 20 U/L (ref 0–44)
AST: 30 U/L (ref 15–41)
Albumin: 3.5 g/dL (ref 3.5–5.0)
Alkaline Phosphatase: 94 U/L (ref 38–126)
Anion gap: 8 (ref 5–15)
BUN: 19 mg/dL (ref 6–20)
CO2: 25 mmol/L (ref 22–32)
Calcium: 9.3 mg/dL (ref 8.9–10.3)
Chloride: 107 mmol/L (ref 98–111)
Creatinine, Ser: 0.81 mg/dL (ref 0.44–1.00)
GFR, Estimated: >60 mL/min (ref >60)
Glucose, Bld: 83 mg/dL (ref 70–99)
Potassium: 3.9 mmol/L (ref 3.5–5.1)
Sodium: 140 mmol/L (ref 135–145)
Total Bilirubin: 0.3 mg/dL (ref 0.3–1.2)
Total Protein: 6.9 g/dL (ref 6.5–8.1)

## 2022-09-23 LAB — DIFFERENTIAL
Abs Immature Granulocytes: 0.03 10*3/uL (ref 0.00–0.07)
Basophils Absolute: 0 10*3/uL (ref 0.0–0.1)
Basophils Relative: 0 %
Eosinophils Absolute: 0.1 10*3/uL (ref 0.0–0.5)
Eosinophils Relative: 1 %
Immature Granulocytes: 0 %
Lymphocytes Relative: 22 %
Lymphs Abs: 2.1 10*3/uL (ref 0.7–4.0)
Monocytes Absolute: 0.5 10*3/uL (ref 0.1–1.0)
Monocytes Relative: 5 %
Neutro Abs: 6.8 10*3/uL (ref 1.7–7.7)
Neutrophils Relative %: 72 %

## 2022-09-23 LAB — I-STAT CHEM 8, ED
BUN: 21 mg/dL — ABNORMAL HIGH (ref 6–20)
Calcium, Ion: 1.18 mmol/L (ref 1.15–1.40)
Chloride: 105 mmol/L (ref 98–111)
Creatinine, Ser: 0.8 mg/dL (ref 0.44–1.00)
Glucose, Bld: 78 mg/dL (ref 70–99)
HCT: 39 % (ref 36.0–46.0)
Hemoglobin: 13.3 g/dL (ref 12.0–15.0)
Potassium: 3.8 mmol/L (ref 3.5–5.1)
Sodium: 143 mmol/L (ref 135–145)
TCO2: 25 mmol/L (ref 22–32)

## 2022-09-23 LAB — I-STAT BETA HCG BLOOD, ED (MC, WL, AP ONLY): I-stat hCG, quantitative: <5 m[IU]/mL (ref <5)

## 2022-09-23 LAB — PROTIME-INR
INR: 1 (ref 0.8–1.2)
Prothrombin Time: 13.1 seconds (ref 11.4–15.2)

## 2022-09-23 LAB — ETHANOL: Alcohol, Ethyl (B): <10 mg/dL (ref <10)

## 2022-09-23 LAB — APTT: aPTT: 27 seconds (ref 24–36)

## 2022-09-23 LAB — CBG MONITORING, ED: Glucose-Capillary: 67 mg/dL — ABNORMAL LOW (ref 70–99)

## 2022-09-23 MED ORDER — SODIUM CHLORIDE 0.9% FLUSH
3.0000 mL | Freq: Once | INTRAVENOUS | Status: AC
  Administered 2022-09-23: 3 mL via INTRAVENOUS

## 2022-09-23 MED ORDER — IOHEXOL 350 MG/ML SOLN
75.0000 mL | Freq: Once | INTRAVENOUS | Status: AC | PRN
  Administered 2022-09-23: 75 mL via INTRAVENOUS

## 2022-09-23 MED ORDER — DEXTROSE 50 % IV SOLN
INTRAVENOUS | Status: AC
  Administered 2022-09-23: 12.5 g via INTRAVENOUS
  Filled 2022-09-23: qty 50

## 2022-09-23 NOTE — ED Notes (Signed)
MD Dixon notified and aware of difficult blood draw.  Phlebotomy aware and is coming to try to get blood work on pt.

## 2022-09-23 NOTE — Consult Note (Signed)
Neurology Consultation  Reason for Consult: Code Stroke Referring Physician: Doren Custard  CC: aphasia, generalized weakness  History is obtained from:Patient, EMS  HPI: Megan Combs is a 61 y.o. female anemia, fibromyalgia, anxiety, HLD, IBS, HTN, DM, recent covid infection presenting with dizziness, nausea, abnormal speech and generalized weakness. Per EMS she was on the phone with her husband between 1100-1130 when she suddenly stopped speaking. He came home and found her laying on the floor. On arrival glucose 67 and she was given 25g dextrose.   On my conversation with husband, he said pt called him around 11am and told him that she was dizzy, felt SOB and nausea and asked him to come back home. Per pt, she felt dizzy but not room spinning, more like lightheadedness. Felt nausea but no vomiting. She felt SOB from her COVID although negative test for the last 2 days. She denies any double vision. On exam, her exam is inconsistent, generally weak, no nystagmus or ataxia. Speech needs effort and prompt, slow with short answer and hypophonic but able to answer questions, name and repeat. CT no acute finding and CTA head and neck negative. Will get stat MRI.   LKW: 1100 tpa given?: no, inconsistent exam Premorbid modified Rankin scale (mRS):  0-Completely asymptomatic and back to baseline post-stroke  ROS: Full ROS was performed and is negative except as noted in the HPI.   Past Medical History:  Diagnosis Date   Allergy    Anemia    Anxiety    Arthritis    right knee   Degenerative disc disease    Depression    Family history of early CAD    Fibromyalgia    Fibromyalgia    Hyperlipidemia    IBS (irritable bowel syndrome)    Lumbar radiculitis    Plantar fasciitis of left foot    PVC (premature ventricular contraction)    Salmonella poisoning      Family History  Problem Relation Age of Onset   Colon cancer Father    Esophageal cancer Father    Cancer Father    Stroke  Father    Heart disease Father    Hypertension Father    Hyperlipidemia Father    Diabetes Father    Prostate cancer Father    Stomach cancer Father    Colon polyps Father    Heart disease Mother    Arthritis Mother    Heart attack Mother    Mental illness Mother    Diabetes Maternal Grandfather    Heart disease Maternal Grandfather    Mental illness Maternal Grandfather    Stroke Maternal Grandfather    Hypertension Brother    Hyperlipidemia Brother    Cancer Maternal Grandmother        circulatory cancer   Heart disease Paternal Grandfather    Diabetes Paternal Grandfather    Pancreatic cancer Neg Hx    Rectal cancer Neg Hx     Social History:   reports that she has never smoked. She has never used smokeless tobacco. She reports that she does not drink alcohol and does not use drugs. Medications No current facility-administered medications for this encounter.  Current Outpatient Medications:    acetic acid 2 % otic solution, , Disp: , Rfl:    acetic acid-hydrocortisone (VOSOL-HC) otic solution, Place 4 drops into both ears daily. Use for one week and then off 3 weeks, Disp: 10 mL, Rfl: 5   acetic acid-hydrocortisone (VOSOL-HC) OTIC solution, Place 4 drops into both  ears Nightly for 2 weeks, then use as needed for itching., Disp: 10 mL, Rfl: 2   albuterol (PROAIR HFA) 108 (90 Base) MCG/ACT inhaler, Inhale 2 puffs by mouth every 6 hours as needed, Disp: 18 g, Rfl: 0   albuterol (VENTOLIN HFA) 108 (90 Base) MCG/ACT inhaler, Inhale 2 puffs into the lungs every 4 to 6 hours as needed., Disp: 6.7 g, Rfl: 1   azelastine (ASTELIN) 0.1 % nasal spray, Place into the nose., Disp: , Rfl:    B Complex-C-E-Zn (B COMPLEX-C-E-ZINC) tablet, Take by mouth., Disp: , Rfl:    benzonatate (TESSALON) 200 MG capsule, Take 1 capsule by mouth three times a day as needed for cough, Disp: 21 capsule, Rfl: 0   buPROPion (WELLBUTRIN XL) 150 MG 24 hr tablet, TK 2 TS PO Q MORNING, Disp: , Rfl:     buPROPion (WELLBUTRIN XL) 150 MG 24 hr tablet, TAKE 1 TABLET BY MOUTH EVERY MORNING WITH 300 MG TABLET, Disp: 30 tablet, Rfl: 0   buPROPion (WELLBUTRIN XL) 300 MG 24 hr tablet, Take 300 mg by mouth daily., Disp: , Rfl:    buPROPion (WELLBUTRIN XL) 300 MG 24 hr tablet, TAKE 1 TABLET BY MOUTH EVERY MORNING, Disp: 90 tablet, Rfl: 1   buPROPion (WELLBUTRIN XL) 300 MG 24 hr tablet, TAKE 1 TABLET BY MOUTH EVERY MORNING, Disp: 90 tablet, Rfl: 1   buPROPion (WELLBUTRIN XL) 300 MG 24 hr tablet, TAKE 1 TABLET BY MOUTH EVERY MORNING, Disp: 90 tablet, Rfl: 1   buPROPion (WELLBUTRIN XL) 300 MG 24 hr tablet, Take 1 tablet by mouth every morning., Disp: 90 tablet, Rfl: 1   buPROPion (WELLBUTRIN XL) 300 MG 24 hr tablet, Take 1 tablet by mouth every morning, Disp: 90 tablet, Rfl: 1   buPROPion (WELLBUTRIN XL) 300 MG 24 hr tablet, TAKE 1 TABLET BY MOUTH ONCE EVERY MORNING., Disp: 90 tablet, Rfl: 2   Calcium Carbonate-Vitamin D (CALCIUM 500 + D) 500-125 MG-UNIT TABS, Take 1 tablet by mouth daily., Disp: , Rfl:    COVID-19 mRNA bivalent vaccine, Pfizer, (PFIZER COVID-19 VAC BIVALENT) injection, Inject into the muscle., Disp: 0.3 mL, Rfl: 0   diclofenac (VOLTAREN) 75 MG EC tablet, as needed., Disp: , Rfl:    doxycycline (DORYX) 100 MG EC tablet, Take 1 tablet (100 mg total) by mouth 2 (two) times daily., Disp: 14 tablet, Rfl: 0   doxycycline (MONODOX) 100 MG capsule, Take 1 capsule by mouth twice a day for 7 days., Disp: 14 capsule, Rfl: 0   doxycycline (VIBRAMYCIN) 100 MG capsule, Take 1 capsule by mouth twice a day for 10 days, Disp: 20 capsule, Rfl: 0   estradiol (ESTRACE) 0.5 MG tablet, Take 0.5 tablets (0.25 mg total) by mouth every other day., Disp: 45 tablet, Rfl: 4   estradiol (ESTRACE) 1 MG tablet, TAKE 1 TABLET (1 MG TOTAL) BY MOUTH DAILY. (Patient taking differently: Take 1 mg by mouth at bedtime. ), Disp: 90 tablet, Rfl: 0   estradiol (ESTRACE) 1 MG tablet, Take 1/2 tablet by mouth once a day, Disp: 45  tablet, Rfl: 1   Estradiol (VAGIFEM) 10 MCG TABS vaginal tablet, Insert 1 tablet vaginally two times a week, Disp: 8 tablet, Rfl: 2   Estradiol (VAGIFEM) 10 MCG TABS vaginal tablet, Insert 1 tablet vaginally 2 times a week, Disp: 8 tablet, Rfl: 4   ferrous sulfate 325 (65 FE) MG tablet, Take 325 mg by mouth 3 (three) times a week. , Disp: , Rfl:    FETZIMA 20  MG CP24, Take 40 mg by mouth every other day. , Disp: , Rfl:    fish oil-omega-3 fatty acids 1000 MG capsule, Take 1 g by mouth daily., Disp: , Rfl:    fluticasone (FLONASE) 50 MCG/ACT nasal spray, , Disp: , Rfl:    fluticasone (FLONASE) 50 MCG/ACT nasal spray, USE 2 SPRAYS IN EACH NOSTRIL EACH NIGHT, Disp: 16 g, Rfl: 11   fluticasone (FLONASE) 50 MCG/ACT nasal spray, Use 2 sprays in each nostril at bedtime., Disp: 16 g, Rfl: 11   fluticasone (FLONASE) 50 MCG/ACT nasal spray, Place 2 sprays into affected nostril(s) at bedtime., Disp: 16 g, Rfl: 11   Glucosamine Sulfate 500 MG CAPS, Take by mouth., Disp: , Rfl:    HYDROcodone-acetaminophen (NORCO/VICODIN) 5-325 MG tablet, Take 1-2 tablets by mouth every 6 (six) hours as needed., Disp: 10 tablet, Rfl: 0   ibuprofen (ADVIL) 800 MG tablet, Take 1 tablet (800 mg total) by mouth 2 (two) times daily as needed., Disp: 40 tablet, Rfl: 0   ketoconazole (NIZORAL) 2 % shampoo, Use externally as directed once a day for 28 days., Disp: 120 mL, Rfl: 1   Levomilnacipran HCl ER (FETZIMA) 20 MG CP24, Take 1 capsule by mouth every morning, Disp: 90 capsule, Rfl: 1   Levomilnacipran HCl ER (FETZIMA) 20 MG CP24, TAKE 1 CAPSULE BY MOUTH ONCE EVERY MORNING., Disp: 90 capsule, Rfl: 2   Levomilnacipran HCl ER 20 MG CP24, Take 1 capsule by mouth once a day, Disp: 14 capsule, Rfl: 1   Levomilnacipran HCl ER 20 MG CP24, Take 1 capsule (20 mg) by mouth every morning., Disp: 90 capsule, Rfl: 1   Levomilnacipran HCl ER 20 MG CP24, Take 1 capsule by mouth every morning., Disp: 90 capsule, Rfl: 0   lidocaine (XYLOCAINE) 2 %  solution, Gargle and spit 5-10 mLs 3-4 hours as needed for throat pain., Disp: 100 mL, Rfl: 0   meloxicam (MOBIC) 15 MG tablet, Take 1 tablet by mouth every day., Disp: 30 tablet, Rfl: 0   mometasone (ELOCON) 0.1 % cream, Apply 1 application topically daily., Disp: , Rfl:    mometasone (ELOCON) 0.1 % cream, Apply to affected area 2 times a day for 7 days, Disp: 15 g, Rfl: 1   Multiple Vitamin (MULITIVITAMIN WITH MINERALS) TABS, Take 1 tablet by mouth daily., Disp: , Rfl:    mupirocin ointment (BACTROBAN) 2 %, Apply to left nostril twice daily for 1 week as directed., Disp: 22 g, Rfl: 0   nirmatrelvir & ritonavir (PAXLOVID, 300/100,) 20 x 150 MG & 10 x '100MG'$  TBPK, Take 3 tablets by mouth 2 (two) times daily., Disp: 30 tablet, Rfl: 0   nortriptyline (PAMELOR) 25 MG capsule, TAKE 1 CAPSULE BY MOUTH AT BEDTIME FOR 1 WEEK, THEN TAKE 2 CAPSULES AT BEDTIME., Disp: 60 capsule, Rfl: 4   nortriptyline (PAMELOR) 25 MG capsule, TAKE 1 CAPSULE BY MOUTH AT BEDTIME, Disp: 30 capsule, Rfl: 4   nystatin cream (MYCOSTATIN), Apply to affected area 2 times a day, Disp: 30 g, Rfl: 6   nystatin ointment (MYCOSTATIN), Apply to affected area 2 times a day for 7 days, Disp: 15 g, Rfl: 0   nystatin ointment (MYCOSTATIN), Apply to affected area 2 times a day for 7 days, Disp: 15 g, Rfl: 0   nystatin-triamcinolone (MYCOLOG II) cream, Apply 1 application topically daily., Disp: , Rfl:    predniSONE (DELTASONE) 20 MG tablet, Take 2 tablets daily for 2 days, then 1 tablet daily for 2 days, then 1/2  tablet daily for 2 days.  Take with food or milk, Disp: 7 tablet, Rfl: 0   Probiotic Product (PROBIOTIC DAILY PO), Take 1 Can by mouth daily., Disp: , Rfl:    progesterone (PROMETRIUM) 100 MG capsule, Take 100 mg by mouth at bedtime., Disp: , Rfl:    progesterone (PROMETRIUM) 100 MG capsule, Take 1 capsule (100 mg total) by mouth every other day., Disp: 45 capsule, Rfl: 3   progesterone (PROMETRIUM) 100 MG capsule, Take 1 capsule (100  mg total) by mouth every other day., Disp: 45 capsule, Rfl: 3   sulindac (CLINORIL) 150 MG tablet, Take by mouth., Disp: , Rfl:    tiZANidine (ZANAFLEX) 4 MG tablet, Take 1-2 tablets (4-8 mg total) by mouth every 6 (six) hours as needed for muscle spasms., Disp: 21 tablet, Rfl: 0   tretinoin (RETIN-A) 0.025 % cream, Apply a pea sized amount to the face once daily in the evening, Disp: 45 g, Rfl: 1   triamcinolone cream (KENALOG) 0.5 %, Apply 1 application twice a day for 7 days, Disp: 15 g, Rfl: 0   VITAMIN D PO, Take by mouth., Disp: , Rfl:   Exam: Current vital signs: BP (!) 159/99   Ht '5\' 6"'$  (1.676 m)   Wt 74.2 kg   LMP 10/28/2008   BMI 26.40 kg/m  Vital signs in last 24 hours: BP: (159)/(99) 159/99 (11/27 1230) Weight:  [74.2 kg] 74.2 kg (11/27 1200)  GENERAL: Awake, alert in NAD HEENT: - Normocephalic and atraumatic, dry mm, no LN++, no Thyromegally LUNGS - Clear to auscultation bilaterally with no wheezes CV - S1S2 RRR, no m/r/g, equal pulses bilaterally. ABDOMEN - Soft, nontender, nondistended with normoactive BS Ext: warm, well perfused, intact peripheral pulses, no edema  NEURO:  Mental Status: Alert oriented to self. Incorrect date Language: speech is dysarthric, mouthing words  Naming, repetition, fluency, and comprehension intact. Cranial Nerves: PERRL mm/brisk. EOMI, visual fields full, left facial droop, facial sensation intact, hearing intact, tongue/uvula/soft palate midline, normal sternocleidomastoid and trapezius muscle strength. No evidence of tongue atrophy or fasciculations Motor:  RUE 4/5  LUE 5/5 RLE 3/5  LLE 3/5 Tone: is normal and bulk is normal Sensation- Diminished sensation on the right arm  Coordination: FTN intact bilaterally, no ataxia in BLE. Gait- deferred  NIHSS components Score: Comment  1a Level of Conscious 0'[x]'$  1'[]'$  2'[]'$  3'[]'$         1b LOC Questions 0'[]'$  1'[x]'$  2'[]'$           1c LOC Commands 0'[x]'$  1'[]'$  2'[]'$           2 Best Gaze 0'[x]'$  1'[]'$  2'[]'$            3 Visual 0'[x]'$  1'[]'$  2'[]'$  3'[]'$         4 Facial Palsy 0'[]'$  1'[x]'$  2'[]'$  3'[]'$         5a Motor Arm - left 0'[]'$  1'[x]'$  2'[]'$  3'[]'$  4'[]'$  UN'[]'$     5b Motor Arm - Right 0'[]'$  1'[x]'$  2'[]'$  3'[]'$  4'[]'$  UN'[]'$     6a Motor Leg - Left 0'[]'$  1'[]'$  2'[x]'$  3'[]'$  4'[]'$  UN'[]'$     6b Motor Leg - Right 0'[]'$  1'[]'$  2'[x]'$  3'[]'$  4'[]'$  UN'[]'$     7 Limb Ataxia 0'[x]'$  1'[]'$  2'[]'$  3'[]'$  UN'[]'$       8 Sensory 0'[]'$  1'[x]'$  2'[]'$  UN'[]'$         9 Best Language 0'[]'$  1'[x]'$  2'[]'$  3'[]'$         10 Dysarthria 0'[x]'$  1'[]'$  2'[]'$  UN'[]'$         11 Extinct. and Inattention  0$'[x]'F$  1'[]'$  2'[]'$           TOTAL: 10        Labs I have reviewed labs in epic and the results pertinent to this consultation are:   CBC    Component Value Date/Time   WBC 7.5 01/12/2019 1036   RBC 4.48 01/12/2019 1036   HGB 13.6 01/12/2019 1036   HCT 40.2 01/12/2019 1036   PLT 204 01/12/2019 1036   MCV 89.7 01/12/2019 1036   MCV 90.1 07/05/2015 1146   MCH 30.4 01/12/2019 1036   MCHC 33.8 01/12/2019 1036   RDW 12.1 01/12/2019 1036   LYMPHSABS 0.7 12/23/2014 0027   MONOABS 0.3 12/23/2014 0027   EOSABS 0.1 12/23/2014 0027   BASOSABS 0.0 12/23/2014 0027    CMP     Component Value Date/Time   NA 140 01/12/2019 1036   K 3.9 01/12/2019 1036   CL 111 01/12/2019 1036   CO2 21 (L) 01/12/2019 1036   GLUCOSE 87 01/12/2019 1036   BUN 15 01/12/2019 1036   CREATININE 0.74 01/12/2019 1036   CREATININE 0.75 10/03/2014 1224   CALCIUM 9.1 01/12/2019 1036   PROT 6.7 01/12/2019 1036   ALBUMIN 3.5 01/12/2019 1036   AST 24 01/12/2019 1036   ALT 15 01/12/2019 1036   ALKPHOS 114 01/12/2019 1036   BILITOT 0.6 01/12/2019 1036   GFRNONAA >60 01/12/2019 1036   GFRNONAA >89 10/03/2014 1224   GFRAA >60 01/12/2019 1036   GFRAA >89 10/03/2014 1224    Lipid Panel     Component Value Date/Time   CHOL 192 09/05/2017 0812   TRIG 79 09/05/2017 0812   HDL 80 09/05/2017 0812   CHOLHDL 2.4 09/05/2017 0812   CHOLHDL 2.3 07/26/2016 0811   VLDL 10 07/26/2016 0811   LDLCALC 96 09/05/2017 0812     Imaging I have reviewed the images  obtained:  CT-head - no acute finding  CT Angio Head and Neck - no LVO  MRI examination of the brain - negative for acute infarct   Assessment:  61 y.o. female anemia, fibromyalgia, anxiety, HLD, IBS presenting with dizziness, SOB, nausea, abnormal speech and general weakness who was found laying on the floor by her husband. LSW 11am, NIHSS = 10 however, inconsistent exam, no focal exam except slight right facial droop. CT and CTA head and neck negative. Not TNK candidate given non disabling symptoms and inconsistent exam. MRI DWI negative also. Etiology now felt to be anxiety and panic attack. Continue home medication. Treatment COVID per EDP. Will need PT/OT/speech evaluation.   Recommendations: Continue home meds after passing swallow PT/OT/speech consult Treatment of COVID per EDP  Discussed with Dr. Doren Custard ED physician If pt getting better and cleared by PT/OT, she can be discharged from ED from neuro standpoint with close outpt follow up.  Rosalin Hawking, MD PhD Stroke Neurology 09/23/2022 1:32 PM

## 2022-09-23 NOTE — ED Triage Notes (Signed)
Pt BIB [facility] form home due to initially having difficulty breathing and dizzy.  Pt husband noticed pt stated it felt "better being on the floor" when he called EMS.  Pt started to have trouble speaking once EMS arrived and there was right sided facial droop with pin and needles in left arm. Pt is having a hard time getting words out.

## 2022-09-23 NOTE — ED Notes (Signed)
Stuck x2 and failed attempt to collect labs

## 2022-09-23 NOTE — Discharge Instructions (Signed)
Follow-up with your primary care doctor for ongoing management of chronic symptoms.  Return to the emergency department at any time for any new or worsening symptoms of concern.

## 2022-09-23 NOTE — Code Documentation (Signed)
Stroke Response Nurse Documentation Code Documentation  Megan Combs is a 61 y.o. female arriving to [facility]  via [facility] on 09/23/22 with past medical hx of HLD, Dep/anx, fribromyalgia. On No antithrombotic. Code stroke was activated by EMS.   Patient with unknown LKW. Patient talked with husband this AM on phone, already dizzy and slightly short of breath at that time. When he arrived home she was lying on the floor. EMS/Fire arrived to find the patient dizzy, short of breath and unable to talk.   Stroke team at the bedside on patient arrival. Labs drawn and patient cleared for CT by EDP. Patient to CT with team. NIHSS 8, see documentation for details and code stroke times. Patient with disoriented, right facial droop, right arm weakness, bilateral leg weakness, and left decreased sensation on exam.  CBG 67, half amp D50 given once access gained. Delay due to difficulty in obtaining IV access.    The following imaging was completed:  CT Head and CTA and MRI. Patient is not a candidate for IV Thrombolytic due to no stroke. Patient is not a candidate for IR due to no stroke. MRI negative.  Care Plan: cancel code stroke.   Bedside handoff with ED RN [place].    Candace Cruise K  Stroke Response RN

## 2022-09-23 NOTE — ED Provider Notes (Signed)
[facility] EMERGENCY DEPARTMENT Provider Note   CSN: [id number] Arrival date & time: 09/23/22  1222  An emergency department physician performed an initial assessment on this suspected stroke patient at 1223.  History  Chief Complaint  Patient presents with   Code Stroke    Megan Combs is a 61 y.o. female.  HPI Patient presents for altered mental status.  Medical history includes anemia, fibromyalgia, anxiety, HLD, IBS.  Per EMS, patient was on the phone with her husband at 45 AM.  She was having a normal conversation but then has been noticed changes in her speech.  EMS was called.  When EMS arrived, patient was on the floor.  They noted global weakness.  During transit, they noticed word finding difficulty.  History per husband: Patient has had ongoing fatigue following a recent COVID-19 infection.  She was treated with Paxlovid.  Because of her fatigue, she has had decreased activity.  Today, he was at work when she called in.  She stated that she felt like she needed help.  On the phone, he did note the sounds of labored breathing.  When he got home, she was on the floor.  She was awake.  Breathing had improved but was still labored.  She had global weakness and was unable to sit up.  At that point, EMS was called.  History per patient: She has had what she describes as a brain fog since her COVID-19 infection.  She had an acute worsening of this 2 days ago which improved.  She had a similar worsening of this today.  She has limited memory of the events on arrival in the emergency department.  Currently, she feels back to the baseline that she has been experiencing over the past 2 weeks which includes a "brain fog as if I am thinking through water".    Home Medications Prior to Admission medications   Medication Sig Start Date End Date Taking? Authorizing Provider  acetic acid 2 % otic solution  03/16/19   [provider]  acetic  acid-hydrocortisone (VOSOL-HC) otic solution Place 4 drops into both ears daily. Use for one week and then off 3 weeks 07/04/15   Tereasa Coop, PA-C  acetic acid-hydrocortisone (VOSOL-HC) OTIC solution Place 4 drops into both ears Nightly for 2 weeks, then use as needed for itching. 09/12/22     albuterol (PROAIR HFA) 108 (90 Base) MCG/ACT inhaler Inhale 2 puffs by mouth every 6 hours as needed 04/13/21     albuterol (VENTOLIN HFA) 108 (90 Base) MCG/ACT inhaler Inhale 2 puffs into the lungs every 4 to 6 hours as needed. 09/14/22     azelastine (ASTELIN) 0.1 % nasal spray Place into the nose. 11/10/17   [provider]  B Complex-C-E-Zn (B COMPLEX-C-E-ZINC) tablet Take by mouth.    [provider]  benzonatate (TESSALON) 200 MG capsule Take 1 capsule by mouth three times a day as needed for cough 07/12/21     buPROPion (WELLBUTRIN XL) 150 MG 24 hr tablet TK 2 TS PO Q MORNING 04/12/19   [provider]  buPROPion (WELLBUTRIN XL) 150 MG 24 hr tablet TAKE 1 TABLET BY MOUTH EVERY MORNING WITH 300 MG TABLET 12/21/20 12/21/21  Plovsky, Berneta Sages, MD  buPROPion (WELLBUTRIN XL) 300 MG 24 hr tablet Take 300 mg by mouth daily.    [provider]  buPROPion (WELLBUTRIN XL) 300 MG 24 hr tablet TAKE 1 TABLET BY MOUTH EVERY MORNING 11/13/20 11/13/21  Plovsky,  Berneta Sages, MD  buPROPion (WELLBUTRIN XL) 300 MG 24 hr tablet TAKE 1 TABLET BY MOUTH EVERY MORNING 04/25/20 04/25/21  Plovsky, Berneta Sages, MD  buPROPion (WELLBUTRIN XL) 300 MG 24 hr tablet TAKE 1 TABLET BY MOUTH EVERY MORNING 01/26/20 01/25/21  Plovsky, Berneta Sages, MD  buPROPion (WELLBUTRIN XL) 300 MG 24 hr tablet Take 1 tablet by mouth every morning. 12/24/21     buPROPion (WELLBUTRIN XL) 300 MG 24 hr tablet Take 1 tablet by mouth every morning 03/18/22     buPROPion (WELLBUTRIN XL) 300 MG 24 hr tablet TAKE 1 TABLET BY MOUTH ONCE EVERY MORNING. 07/15/22     Calcium Carbonate-Vitamin D (CALCIUM 500 + D) 500-125 MG-UNIT TABS Take 1 tablet by mouth  daily.    [provider]  COVID-19 mRNA bivalent vaccine, Pfizer, (PFIZER COVID-19 VAC BIVALENT) injection Inject into the muscle. 10/16/21   Carlyle Basques, MD  diclofenac (VOLTAREN) 75 MG EC tablet as needed. 09/12/15   [provider]  doxycycline (DORYX) 100 MG EC tablet Take 1 tablet (100 mg total) by mouth 2 (two) times daily. 08/05/22     doxycycline (MONODOX) 100 MG capsule Take 1 capsule by mouth twice a day for 7 days. 03/15/22     doxycycline (VIBRAMYCIN) 100 MG capsule Take 1 capsule by mouth twice a day for 10 days 07/12/21     estradiol (ESTRACE) 0.5 MG tablet Take 0.5 tablets (0.25 mg total) by mouth every other day. 07/29/22     estradiol (ESTRACE) 1 MG tablet TAKE 1 TABLET (1 MG TOTAL) BY MOUTH DAILY. Patient taking differently: Take 1 mg by mouth at bedtime.  10/19/15   Elby Beck, FNP  estradiol (ESTRACE) 1 MG tablet Take 1/2 tablet by mouth once a day 04/16/22     Estradiol (VAGIFEM) 10 MCG TABS vaginal tablet Insert 1 tablet vaginally two times a week 05/15/22     Estradiol (VAGIFEM) 10 MCG TABS vaginal tablet Insert 1 tablet vaginally 2 times a week 07/29/22     ferrous sulfate 325 (65 FE) MG tablet Take 325 mg by mouth 3 (three) times a week.     [provider]  FETZIMA 20 MG CP24 Take 40 mg by mouth every other day.  01/04/19   [provider]  fish oil-omega-3 fatty acids 1000 MG capsule Take 1 g by mouth daily.    [provider]  fluticasone Asencion Islam) 50 MCG/ACT nasal spray  05/05/19   [provider]  fluticasone Asencion Islam) 50 MCG/ACT nasal spray USE 2 SPRAYS IN [facility] NOSTRIL [facility] NIGHT 06/26/20 07/06/21  Jerrell Belfast, MD  fluticasone [facility]) 50 MCG/ACT nasal spray Use 2 sprays in each nostril at bedtime. 06/12/21     fluticasone (FLONASE) 50 MCG/ACT nasal spray Place 2 sprays into affected nostril(s) at bedtime. 08/06/22     Glucosamine Sulfate 500 MG CAPS Take by mouth.    [provider]   HYDROcodone-acetaminophen (NORCO/VICODIN) 5-325 MG tablet Take 1-2 tablets by mouth every 6 (six) hours as needed. 08/02/20   Raylene Everts, MD  ibuprofen (ADVIL) 800 MG tablet Take 1 tablet (800 mg total) by mouth 2 (two) times daily as needed. 04/05/21   Thurman Coyer, DO  ketoconazole (NIZORAL) 2 % shampoo Use externally as directed once a day for 28 days. 08/24/21     Levomilnacipran HCl ER (FETZIMA) 20 MG CP24 Take 1 capsule by mouth every morning 03/18/22     Levomilnacipran HCl ER (FETZIMA) 20 MG CP24 TAKE 1 CAPSULE BY  MOUTH ONCE EVERY MORNING. 07/15/22     Levomilnacipran HCl ER 20 MG CP24 Take 1 capsule by mouth once a day 09/13/21   Norma Fredrickson, MD  Levomilnacipran HCl ER 20 MG CP24 Take 1 capsule (20 mg) by mouth every morning. 10/15/21     Levomilnacipran HCl ER 20 MG CP24 Take 1 capsule by mouth every morning. 03/18/22   Plovsky, Berneta Sages, MD  lidocaine (XYLOCAINE) 2 % solution Gargle and spit 5-10 mLs 3-4 hours as needed for throat pain. 09/14/22     meloxicam (MOBIC) 15 MG tablet Take 1 tablet by mouth every day. 07/03/22     mometasone (ELOCON) 0.1 % cream Apply 1 application topically daily. 01/06/19   [provider]  mometasone (ELOCON) 0.1 % cream Apply to affected area 2 times a day for 7 days 04/17/21     Multiple Vitamin (MULITIVITAMIN WITH MINERALS) TABS Take 1 tablet by mouth daily.    [provider]  mupirocin ointment (BACTROBAN) 2 % Apply to left nostril twice daily for 1 week as directed. 07/27/21     nirmatrelvir & ritonavir (PAXLOVID, 300/100,) 20 x 150 MG & 10 x '100MG'$  TBPK Take 3 tablets by mouth 2 (two) times daily. 09/14/22     nortriptyline (PAMELOR) 25 MG capsule TAKE 1 CAPSULE BY MOUTH AT BEDTIME FOR 1 WEEK, THEN TAKE 2 CAPSULES AT BEDTIME. 11/13/20 11/13/21  Plovsky, Berneta Sages, MD  nortriptyline (PAMELOR) 25 MG capsule TAKE 1 CAPSULE BY MOUTH AT BEDTIME 11/13/20 11/13/21  Plovsky, Berneta Sages, MD  nystatin cream (MYCOSTATIN) Apply to affected area 2  times a day 08/24/21     nystatin ointment (MYCOSTATIN) Apply to affected area 2 times a day for 7 days 04/17/21     nystatin ointment (MYCOSTATIN) Apply to affected area 2 times a day for 7 days 07/29/22     nystatin-triamcinolone (MYCOLOG II) cream Apply 1 application topically daily. 01/06/19   [provider]  predniSONE (DELTASONE) 20 MG tablet Take 2 tablets daily for 2 days, then 1 tablet daily for 2 days, then 1/2 tablet daily for 2 days.  Take with food or milk 05/24/21     Probiotic Product (PROBIOTIC DAILY PO) Take 1 Can by mouth daily.    [provider]  progesterone (PROMETRIUM) 100 MG capsule Take 100 mg by mouth at bedtime. 05/27/20   [provider]  progesterone (PROMETRIUM) 100 MG capsule Take 1 capsule (100 mg total) by mouth every other day. 07/09/22     progesterone (PROMETRIUM) 100 MG capsule Take 1 capsule (100 mg total) by mouth every other day. 07/29/22     sulindac (CLINORIL) 150 MG tablet Take by mouth. 06/20/17   [provider]  tiZANidine (ZANAFLEX) 4 MG tablet Take 1-2 tablets (4-8 mg total) by mouth every 6 (six) hours as needed for muscle spasms. 08/02/20   Raylene Everts, MD  tretinoin (RETIN-A) 0.025 % cream Apply a pea sized amount to the face once daily in the evening 07/03/21     triamcinolone cream (KENALOG) 0.5 % Apply 1 application twice a day for 7 days 05/24/21     VITAMIN D PO Take by mouth.    [provider]      Allergies    Colace  [docusate calcium], Morphine and related, Other, Penicillins, Zithromax [azithromycin], Dulcolax [bisacodyl], Effexor [venlafaxine], Erythromycin, Prednisone, and Tape    Review of Systems   Review of Systems  Constitutional:  Positive for activity change and fatigue.  Respiratory:  Positive  for shortness of breath.   Neurological:  Positive for weakness (Generalized).  Psychiatric/Behavioral:  Positive for confusion.   All other systems reviewed and are negative.   Physical  Exam Updated Vital Signs BP 139/78   Pulse 72   Temp (!) 97.5 F (36.4 C) (Oral)   Resp 12   Ht '5\' 6"'$  (1.676 m)   Wt 74.2 kg   LMP 10/28/2008   SpO2 100%   BMI 26.40 kg/m  Physical Exam Vitals and nursing note reviewed.  Constitutional:      General: She is not in acute distress.    Appearance: Normal appearance. She is well-developed and normal weight. She is not ill-appearing, toxic-appearing or diaphoretic.  HENT:     Head: Normocephalic and atraumatic.     Right Ear: External ear normal.     Left Ear: External ear normal.     Nose: Nose normal.     Mouth/Throat:     Mouth: Mucous membranes are moist.  Eyes:     Extraocular Movements: Extraocular movements intact.     Conjunctiva/sclera: Conjunctivae normal.  Cardiovascular:     Rate and Rhythm: Normal rate and regular rhythm.  Pulmonary:     Effort: Pulmonary effort is normal. No respiratory distress.  Abdominal:     General: There is no distension.     Palpations: Abdomen is soft.     Tenderness: There is no abdominal tenderness.  Musculoskeletal:        General: No swelling or deformity. Normal range of motion.     Cervical back: Normal range of motion and neck supple.     Right lower leg: No edema.     Left lower leg: No edema.  Skin:    General: Skin is warm and dry.     Coloration: Skin is not jaundiced or pale.  Neurological:     General: No focal deficit present.     Mental Status: She is alert and oriented to person, place, and time.     Cranial Nerves: No cranial nerve deficit.     Sensory: No sensory deficit.     Motor: No weakness.     Coordination: Coordination normal.  Psychiatric:        Mood and Affect: Mood normal.        Behavior: Behavior normal.        Thought Content: Thought content normal.        Judgment: Judgment normal.     ED Results / Procedures / Treatments   Labs (all labs ordered are listed, but only abnormal results are displayed) Labs Reviewed  I-STAT CHEM 8, ED -  Abnormal; Notable for the following components:      Result Value   BUN 21 (*)    All other components within normal limits  CBG MONITORING, ED - Abnormal; Notable for the following components:   Glucose-Capillary 67 (*)    All other components within normal limits  PROTIME-INR  APTT  CBC  DIFFERENTIAL  COMPREHENSIVE METABOLIC PANEL  ETHANOL  I-STAT BETA HCG BLOOD, ED ([facility], [facility], [facility] ONLY)    EKG EKG Interpretation  Date/Time:  Monday September 23 2022 13:36:45 EST Ventricular Rate:  85 PR Interval:  129 QRS Duration: 103 QT Interval:  374 QTC Calculation: 445 R Axis:   88 Text Interpretation: Sinus rhythm Confirmed by Godfrey Pick [phone]) on 09/23/2022 3:56:31 PM  Radiology CT HEAD CODE STROKE WO CONTRAST  Result Date: 09/23/2022 CLINICAL DATA:  Code stroke.  Neuro deficit,  stroke suspected. EXAM: CT ANGIOGRAPHY HEAD AND NECK TECHNIQUE: Multidetector CT imaging of the head and neck was performed using the standard protocol during bolus administration of intravenous contrast. Multiplanar CT image reconstructions and MIPs were obtained to evaluate the vascular anatomy. Carotid stenosis measurements (when applicable) are obtained utilizing NASCET criteria, using the distal internal carotid diameter as the denominator. RADIATION DOSE REDUCTION: This exam was performed according to the departmental dose-optimization program which includes automated exposure control, adjustment of the mA and/or kV according to patient size and/or use of iterative reconstruction technique. CONTRAST:  45m OMNIPAQUE IOHEXOL 350 MG/ML SOLN COMPARISON:  CT head 04/01/2008 FINDINGS: CT HEAD FINDINGS Brain: There is no acute intracranial hemorrhage, extra-axial fluid collection, or acute infarct. Parenchymal volume is normal. The ventricles are normal in size. Gray-white differentiation is preserved. There is no mass lesion. There is no mass effect or midline shift. Vascular: No hyperdense vessel or unexpected  calcification. Skull: Normal. Negative for fracture or focal lesion. Sinuses/Orbits: Postsurgical changes reflecting prior sinus surgery are noted. There is a retention cyst in the left maxillary sinus, incompletely imaged. Bilateral lens implants are in place. The globes and orbits are otherwise unremarkable. Other: None. ASPECTS ([facility] Score) - Ganglionic level infarction (caudate, lentiform nuclei, internal capsule, insula, M1-M3 cortex): 7 - Supraganglionic infarction (M4-M6 cortex): 3 Total score (0-10 with 10 being normal): 10 CTA NECK FINDINGS Aortic arch: The imaged aortic arch is normal. The origins of the major branch vessels are patent. The subclavian arteries are patent to the level imaged. Right carotid system: The right common, internal, and external carotid arteries are patent, without hemodynamically significant stenosis or occlusion. There is no dissection or aneurysm. The internal carotid artery is somewhat tortuous in the upper neck. Left carotid system: The left common, internal, and external carotid arteries are patent, without hemodynamically significant stenosis or occlusion. There is no dissection or aneurysm. Vertebral arteries: The vertebral arteries are patent, without hemodynamically significant stenosis or occlusion. There is no dissection or aneurysm. Skeleton: There is no acute osseous abnormality or suspicious osseous lesion. There is no visible canal hematoma. Other neck: The imaged lung apices are clear. Upper chest: The soft tissues of the neck are unremarkable. Review of the MIP images confirms the above findings CTA HEAD FINDINGS Anterior circulation: The intracranial ICAs are patent. The bilateral MCAs are patent, without proximal stenosis or occlusion. The bilateral ACAs are patent, without proximal stenosis or occlusion. The anterior communicating artery is normal. There is no aneurysm or AVM. Posterior circulation: The bilateral V4 segments are  patent, diminutive on the left. The basilar artery is patent. The major cerebellar arteries are patent. The bilateral PCAs are patent, without proximal stenosis or occlusion. Bilateral posterior communicating arteries are identified. Venous sinuses: Patent. Anatomic variants: None. Review of the MIP images confirms the above findings IMPRESSION: 1. No acute intracranial pathology.  ASPECTS is 10 2. Normal vasculature of the head and neck. The results of the initial noncontrast head CT were paged via Amion to Dr XErlinda Hongat approximately 12:45 pm. Electronically Signed   By: PValetta MoleM.D.   On: 09/23/2022 13:42   CT ANGIO HEAD NECK W WO CM (CODE STROKE)  Result Date: 09/23/2022 CLINICAL DATA:  Code stroke.  Neuro deficit, stroke suspected. EXAM: CT ANGIOGRAPHY HEAD AND NECK TECHNIQUE: Multidetector CT imaging of the head and neck was performed using the standard protocol during bolus administration of intravenous contrast. Multiplanar CT image reconstructions and MIPs were obtained to  evaluate the vascular anatomy. Carotid stenosis measurements (when applicable) are obtained utilizing NASCET criteria, using the distal internal carotid diameter as the denominator. RADIATION DOSE REDUCTION: This exam was performed according to the departmental dose-optimization program which includes automated exposure control, adjustment of the mA and/or kV according to patient size and/or use of iterative reconstruction technique. CONTRAST:  61m OMNIPAQUE IOHEXOL 350 MG/ML SOLN COMPARISON:  CT head 04/01/2008 FINDINGS: CT HEAD FINDINGS Brain: There is no acute intracranial hemorrhage, extra-axial fluid collection, or acute infarct. Parenchymal volume is normal. The ventricles are normal in size. Gray-white differentiation is preserved. There is no mass lesion. There is no mass effect or midline shift. Vascular: No hyperdense vessel or unexpected calcification. Skull: Normal. Negative for fracture or focal lesion. Sinuses/Orbits:  Postsurgical changes reflecting prior sinus surgery are noted. There is a retention cyst in the left maxillary sinus, incompletely imaged. Bilateral lens implants are in place. The globes and orbits are otherwise unremarkable. Other: None. ASPECTS ([facility] Score) - Ganglionic level infarction (caudate, lentiform nuclei, internal capsule, insula, M1-M3 cortex): 7 - Supraganglionic infarction (M4-M6 cortex): 3 Total score (0-10 with 10 being normal): 10 CTA NECK FINDINGS Aortic arch: The imaged aortic arch is normal. The origins of the major branch vessels are patent. The subclavian arteries are patent to the level imaged. Right carotid system: The right common, internal, and external carotid arteries are patent, without hemodynamically significant stenosis or occlusion. There is no dissection or aneurysm. The internal carotid artery is somewhat tortuous in the upper neck. Left carotid system: The left common, internal, and external carotid arteries are patent, without hemodynamically significant stenosis or occlusion. There is no dissection or aneurysm. Vertebral arteries: The vertebral arteries are patent, without hemodynamically significant stenosis or occlusion. There is no dissection or aneurysm. Skeleton: There is no acute osseous abnormality or suspicious osseous lesion. There is no visible canal hematoma. Other neck: The imaged lung apices are clear. Upper chest: The soft tissues of the neck are unremarkable. Review of the MIP images confirms the above findings CTA HEAD FINDINGS Anterior circulation: The intracranial ICAs are patent. The bilateral MCAs are patent, without proximal stenosis or occlusion. The bilateral ACAs are patent, without proximal stenosis or occlusion. The anterior communicating artery is normal. There is no aneurysm or AVM. Posterior circulation: The bilateral V4 segments are patent, diminutive on the left. The basilar artery is patent. The major cerebellar  arteries are patent. The bilateral PCAs are patent, without proximal stenosis or occlusion. Bilateral posterior communicating arteries are identified. Venous sinuses: Patent. Anatomic variants: None. Review of the MIP images confirms the above findings IMPRESSION: 1. No acute intracranial pathology.  ASPECTS is 10 2. Normal vasculature of the head and neck. The results of the initial noncontrast head CT were paged via Amion to Dr XErlinda Hongat approximately 12:45 pm. Electronically Signed   By: PValetta MoleM.D.   On: 09/23/2022 13:42   MR BRAIN WO CONTRAST  Result Date: 09/23/2022 CLINICAL DATA:  Neuro deficit, stroke suspected. EXAM: MRI HEAD WITHOUT CONTRAST TECHNIQUE: Multiplanar, multiecho pulse sequences of the brain and surrounding structures were obtained without intravenous contrast. COMPARISON:  Same-day CT head. FINDINGS: Brain: There is no evidence of acute intracranial hemorrhage, extra-axial fluid collection, or acute infarct. Parenchymal volume is normal. The ventricles are normal in size. Gray-white differentiation is preserved. Parenchymal signal is normal, with no significant burden of small-vessel ischemic change. There is no mass lesion.  There is no mass effect or midline shift.  Vascular: Normal flow voids. Skull and upper cervical spine: Normal marrow signal. Sinuses/Orbits: Postsurgical changes are noted in the sinuses. There is a retention cyst in the left maxillary sinus. The globes and orbits are unremarkable. The optic nerves are grossly unremarkable on these nondedicated sequences. Other: None. IMPRESSION: Unremarkable brain MRI with no acute intracranial pathology. Electronically Signed   By: Valetta Mole M.D.   On: 09/23/2022 13:39    Procedures Procedures    Medications Ordered in ED Medications  sodium chloride flush (NS) 0.9 % injection 3 mL (3 mLs Intravenous Given 09/23/22 1312)  dextrose 50 % solution (12.5 g Intravenous Given 09/23/22 1251)  iohexol (OMNIPAQUE) 350 MG/ML  injection 75 mL (75 mLs Intravenous Contrast Given 09/23/22 1339)    ED Course/ Medical Decision Making/ A&P                           Medical Decision Making Amount and/or Complexity of Data Reviewed Labs: ordered. Radiology: ordered.   This patient presents to the ED for concern of altered mental status, this involves an extensive number of treatment options, and is a complaint that carries with it a high risk of complications and morbidity.  The differential diagnosis includes CVA, TIA, seizure, panic attack, metabolic derangements   Co morbidities that complicate the patient evaluation  anemia, fibromyalgia, anxiety, HLD, IBS   Additional history obtained:  Additional history obtained from EMS, patient's husband External records from outside source obtained and reviewed including EMR   Lab Tests:  I Ordered, and personally interpreted labs.  The pertinent results include: I-STAT shows normal kidney function and normal electrolytes.  CBC shows normal hemoglobin and no leukocytosis.   Imaging Studies ordered:  I ordered imaging studies including CT head, CTA head and neck, MRI brain I independently visualized and interpreted imaging which showed no acute findings I agree with the radiologist interpretation   Cardiac Monitoring: / EKG:  The patient was maintained on a cardiac monitor.  I personally viewed and interpreted the cardiac monitored which showed an underlying rhythm of: Sinus rhythm   Consultations Obtained:  I requested consultation with the neurologist, Dr. Erlinda Hong,  and discussed lab and imaging findings as well as pertinent plan - they recommend: No further stroke work-up   Problem List / ED Course / Critical interventions / Medication management  Patient is a 61 year old female who presents for a episode of confusion, generalized weakness, and shortness of breath.  This occurred at approximately 11 AM.  EMS noted global weakness, possible facial asymmetry,  and word finding difficulty.  Patient arrives as a code stroke.  On arrival, she is awake and alert.  She was taken directly to CT scanner.  Neurology was at bedside for evaluation.  CT of head did not show acute findings.  She was taken to MRI.  Upon her return from MRI, patient is back to mental baseline.  At this time, speech is fluent.  Breathing is unlabored.  Vital signs are normal.  MRI did not show any acute findings.  I spoke with neurologist, Dr. Erlinda Hong, who does not recommend any further stroke work-up.  Laboratory work-up was initiated to assess for metabolic causes of her episode.  Patient was kept on bedside cardiac monitor under ED observation.  Laboratory work-up is all reassuring.  Patient was discharged in good condition.   Social Determinants of Health:  Lives at home with husband         Final  Clinical Impression(s) / ED Diagnoses Final diagnoses:  Fatigue, unspecified type  Generalized weakness    Rx / DC Orders ED Discharge Orders     None         Godfrey Pick, MD 09/23/22 1607

## 2022-09-23 NOTE — ED Notes (Signed)
Pt currently in MRI with this RN and stroke RN's

## 2022-09-29 ENCOUNTER — Other Ambulatory Visit (HOSPITAL_COMMUNITY): Payer: Self-pay | Admitting: Psychiatry

## 2022-09-30 ENCOUNTER — Other Ambulatory Visit (HOSPITAL_COMMUNITY): Payer: Self-pay

## 2022-10-01 ENCOUNTER — Other Ambulatory Visit (HOSPITAL_COMMUNITY): Payer: Self-pay

## 2022-10-03 ENCOUNTER — Other Ambulatory Visit (HOSPITAL_COMMUNITY): Payer: Self-pay

## 2022-10-04 ENCOUNTER — Other Ambulatory Visit (HOSPITAL_COMMUNITY): Payer: Self-pay

## 2022-10-05 ENCOUNTER — Other Ambulatory Visit (HOSPITAL_COMMUNITY): Payer: Self-pay

## 2022-10-07 ENCOUNTER — Other Ambulatory Visit (HOSPITAL_COMMUNITY): Payer: Self-pay

## 2022-10-15 ENCOUNTER — Ambulatory Visit
Admission: RE | Admit: 2022-10-15 | Discharge: 2022-10-15 | Disposition: A | Discharge status: Home / Self Care | Payer: 59 | Source: Ambulatory Visit | Attending: Internal Medicine | Admitting: Internal Medicine

## 2022-10-15 DIAGNOSIS — Z1231 Encounter for screening mammogram for malignant neoplasm of breast: Secondary | ICD-10-CM

## 2022-10-30 ENCOUNTER — Other Ambulatory Visit (HOSPITAL_COMMUNITY): Payer: Self-pay

## 2022-10-31 ENCOUNTER — Other Ambulatory Visit (HOSPITAL_COMMUNITY): Payer: Self-pay

## 2022-11-01 ENCOUNTER — Other Ambulatory Visit: Payer: Self-pay

## 2022-11-01 ENCOUNTER — Other Ambulatory Visit (HOSPITAL_COMMUNITY): Payer: Self-pay

## 2022-11-03 ENCOUNTER — Other Ambulatory Visit (HOSPITAL_COMMUNITY): Payer: Self-pay

## 2022-11-04 ENCOUNTER — Ambulatory Visit
Admission: RE | Admit: 2022-11-04 | Discharge: 2022-11-04 | Disposition: A | Discharge status: Home / Self Care | Payer: 59 | Source: Ambulatory Visit | Attending: Internal Medicine | Admitting: Internal Medicine

## 2022-11-04 ENCOUNTER — Other Ambulatory Visit (HOSPITAL_COMMUNITY): Payer: Self-pay

## 2022-11-04 ENCOUNTER — Other Ambulatory Visit: Payer: Self-pay | Admitting: Internal Medicine

## 2022-11-04 ENCOUNTER — Other Ambulatory Visit: Payer: Self-pay

## 2022-11-04 DIAGNOSIS — R0602 Shortness of breath: Secondary | ICD-10-CM

## 2022-11-05 ENCOUNTER — Other Ambulatory Visit (HOSPITAL_COMMUNITY): Payer: Self-pay

## 2022-11-05 MED ORDER — ESTRADIOL 0.5 MG PO TABS
0.2500 mg | ORAL_TABLET | ORAL | 4 refills | Status: DC
  Filled 2022-11-08: qty 45, 180d supply, fill #0
  Filled 2022-11-11: qty 15, 60d supply, fill #0

## 2022-11-08 ENCOUNTER — Other Ambulatory Visit (HOSPITAL_COMMUNITY): Payer: Self-pay | Admitting: Psychiatry

## 2022-11-09 ENCOUNTER — Other Ambulatory Visit (HOSPITAL_COMMUNITY): Payer: Self-pay

## 2022-11-11 ENCOUNTER — Other Ambulatory Visit (HOSPITAL_COMMUNITY): Payer: Self-pay

## 2022-11-11 ENCOUNTER — Other Ambulatory Visit: Payer: Self-pay

## 2022-11-11 MED ORDER — FETZIMA 40 MG PO CP24
1.0000 | ORAL_CAPSULE | Freq: Every morning | ORAL | 2 refills | Status: DC
  Filled 2023-08-20: qty 30, 30d supply, fill #0
  Filled 2023-09-20: qty 30, 30d supply, fill #1
  Filled 2023-10-15: qty 30, 30d supply, fill #2

## 2022-11-11 MED ORDER — FETZIMA 40 MG PO CP24
1.0000 | ORAL_CAPSULE | Freq: Every morning | ORAL | 2 refills | Status: DC
  Filled 2022-11-11: qty 30, 30d supply, fill #0
  Filled 2022-12-08: qty 30, 30d supply, fill #1
  Filled 2023-01-05: qty 30, 30d supply, fill #2

## 2022-11-11 MED ORDER — BUPROPION HCL ER (XL) 300 MG PO TB24
300.0000 mg | ORAL_TABLET | Freq: Every morning | ORAL | 1 refills | Status: DC
  Filled 2022-11-11: qty 30, 30d supply, fill #0

## 2022-11-12 ENCOUNTER — Other Ambulatory Visit (HOSPITAL_COMMUNITY): Payer: Self-pay

## 2022-11-13 ENCOUNTER — Other Ambulatory Visit (HOSPITAL_COMMUNITY): Payer: Self-pay

## 2022-11-13 MED ORDER — ESTRADIOL 0.5 MG PO TABS
0.2500 mg | ORAL_TABLET | ORAL | 4 refills | Status: DC
  Filled 2022-11-13: qty 7, 28d supply, fill #0

## 2022-11-18 ENCOUNTER — Other Ambulatory Visit (HOSPITAL_COMMUNITY): Payer: Self-pay

## 2022-11-19 ENCOUNTER — Other Ambulatory Visit (HOSPITAL_COMMUNITY): Payer: Self-pay

## 2022-11-19 MED ORDER — ESTRADIOL 0.5 MG PO TABS
0.2500 mg | ORAL_TABLET | Freq: Every day | ORAL | 4 refills | Status: DC
  Filled 2022-11-19: qty 15, 30d supply, fill #0
  Filled 2022-12-25: qty 15, 30d supply, fill #1
  Filled 2023-01-26: qty 15, 30d supply, fill #2
  Filled 2023-02-23: qty 15, 30d supply, fill #3
  Filled 2023-03-28: qty 15, 30d supply, fill #4
  Filled 2023-04-28: qty 15, 30d supply, fill #5
  Filled 2023-05-27: qty 15, 30d supply, fill #6
  Filled 2023-06-25: qty 15, 30d supply, fill #7
  Filled 2023-07-23: qty 15, 30d supply, fill #8
  Filled 2023-08-24: qty 15, 30d supply, fill #9
  Filled 2023-09-20: qty 15, 30d supply, fill #10
  Filled 2023-10-15: qty 15, 30d supply, fill #11

## 2022-11-21 ENCOUNTER — Other Ambulatory Visit (HOSPITAL_COMMUNITY): Payer: Self-pay

## 2022-11-25 ENCOUNTER — Other Ambulatory Visit (HOSPITAL_COMMUNITY): Payer: Self-pay

## 2022-11-26 ENCOUNTER — Other Ambulatory Visit (HOSPITAL_COMMUNITY): Payer: Self-pay

## 2022-12-09 ENCOUNTER — Other Ambulatory Visit (HOSPITAL_COMMUNITY): Payer: Self-pay

## 2022-12-10 ENCOUNTER — Other Ambulatory Visit (HOSPITAL_COMMUNITY): Payer: Self-pay

## 2022-12-10 ENCOUNTER — Other Ambulatory Visit: Payer: Self-pay

## 2022-12-17 ENCOUNTER — Other Ambulatory Visit (HOSPITAL_COMMUNITY): Payer: Self-pay

## 2022-12-19 ENCOUNTER — Other Ambulatory Visit (HOSPITAL_COMMUNITY): Payer: Self-pay

## 2022-12-19 MED ORDER — TOBRAMYCIN-DEXAMETHASONE 0.3-0.1 % OP SUSP
3.0000 [drp] | Freq: Every day | OPHTHALMIC | 0 refills | Status: DC
  Filled 2022-12-19: qty 5, 30d supply, fill #0

## 2022-12-30 ENCOUNTER — Other Ambulatory Visit (HOSPITAL_COMMUNITY): Payer: Self-pay

## 2022-12-30 MED ORDER — DOXYCYCLINE HYCLATE 100 MG PO TABS
100.0000 mg | ORAL_TABLET | Freq: Two times a day (BID) | ORAL | 0 refills | Status: DC
  Filled 2022-12-30: qty 14, 7d supply, fill #0

## 2023-01-13 ENCOUNTER — Other Ambulatory Visit (HOSPITAL_COMMUNITY): Payer: Self-pay

## 2023-01-13 MED ORDER — LEVOCETIRIZINE DIHYDROCHLORIDE 5 MG PO TABS
ORAL_TABLET | ORAL | 1 refills | Status: DC
  Filled 2023-01-13: qty 30, 30d supply, fill #0

## 2023-01-14 ENCOUNTER — Other Ambulatory Visit (HOSPITAL_COMMUNITY): Payer: Self-pay

## 2023-01-14 MED ORDER — CEPHALEXIN 500 MG PO CAPS
500.0000 mg | ORAL_CAPSULE | Freq: Four times a day (QID) | ORAL | 0 refills | Status: DC
  Filled 2023-01-14: qty 20, 5d supply, fill #0

## 2023-01-14 MED ORDER — PREDNISONE 10 MG PO TABS
10.0000 mg | ORAL_TABLET | Freq: Every day | ORAL | 0 refills | Status: DC
  Filled 2023-01-14: qty 7, 7d supply, fill #0

## 2023-01-27 ENCOUNTER — Other Ambulatory Visit (HOSPITAL_COMMUNITY): Payer: Self-pay

## 2023-01-27 MED ORDER — BUPROPION HCL ER (XL) 300 MG PO TB24
300.0000 mg | ORAL_TABLET | Freq: Every morning | ORAL | 1 refills | Status: DC
  Filled 2023-01-27: qty 30, 30d supply, fill #0
  Filled 2023-03-09: qty 30, 30d supply, fill #1
  Filled 2023-04-10: qty 30, 30d supply, fill #2

## 2023-01-27 MED ORDER — FETZIMA 40 MG PO CP24
40.0000 mg | ORAL_CAPSULE | Freq: Every morning | ORAL | 2 refills | Status: DC
  Filled 2023-01-27: qty 30, 30d supply, fill #0
  Filled 2023-03-12: qty 30, 30d supply, fill #1
  Filled 2023-04-10 – 2023-04-21 (×2): qty 30, 30d supply, fill #2
  Filled 2023-05-20: qty 30, 30d supply, fill #3

## 2023-01-28 ENCOUNTER — Other Ambulatory Visit: Payer: Self-pay

## 2023-01-28 ENCOUNTER — Other Ambulatory Visit (HOSPITAL_COMMUNITY): Payer: Self-pay

## 2023-03-11 ENCOUNTER — Other Ambulatory Visit (HOSPITAL_COMMUNITY): Payer: Self-pay

## 2023-03-13 ENCOUNTER — Other Ambulatory Visit (HOSPITAL_COMMUNITY): Payer: Self-pay

## 2023-03-25 ENCOUNTER — Ambulatory Visit: Payer: 59 | Admitting: Family Medicine

## 2023-03-25 ENCOUNTER — Encounter: Payer: Self-pay | Admitting: Family Medicine

## 2023-03-25 VITALS — BP 120/78 | Ht 66.0 in | Wt 165.0 lb

## 2023-03-25 DIAGNOSIS — M25551 Pain in right hip: Secondary | ICD-10-CM | POA: Diagnosis not present

## 2023-03-25 NOTE — Patient Instructions (Signed)
You have strained your gluteal muscles (medius and maximus). Rest for the next week. Icing 15 minutes at a time 3-4 times a day. Aleve 2 tabs twice a day with food or pain and inflammation - take for 7-10 days then as needed. Avoid deep squats, lunges, rower for now - ease back into these in about 2-3 weeks as we discussed. Use pain as your guide for other activities. Reformer should be fine but would probably wait 1 more week before going back to this. Wait a week before starting the hip side raises and rotations. Follow up in 1 month but call me sooner if you're struggling.

## 2023-03-26 ENCOUNTER — Encounter: Payer: Self-pay | Admitting: Family Medicine

## 2023-03-26 NOTE — Progress Notes (Signed)
PCP: Lorenda Ishihara, MD  Subjective:   HPI: Patient is a 62 y.o. female here for right hip pain.  Patient overall did well after issues last year with right hip, external rotator partial tear. 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 She worked out with a Psychologist, educational who pushed her a little more including squats and using a rower with higher resistance. After this, next day felt some pretty severe posterior right hip pain. No radiation into extremity. No numbness or tingling. Tried massage but very painful with this.  Past Medical History:  Diagnosis Date   Allergy    Anemia    Anxiety    Arthritis    right knee   Degenerative disc disease    Depression    Family history of early CAD    Fibromyalgia    Fibromyalgia    Hyperlipidemia    IBS (irritable bowel syndrome)    Lumbar radiculitis    Plantar fasciitis of left foot    PVC (premature ventricular contraction)    Salmonella poisoning     Current Outpatient Medications on File Prior to Visit  Medication Sig Dispense Refill   acetic acid 2 % otic solution      acetic acid-hydrocortisone (VOSOL-HC) otic solution Place 4 drops into both ears daily. Use for one week and then off 3 weeks 10 mL 5   acetic acid-hydrocortisone (VOSOL-HC) OTIC solution Place 4 drops into both ears nightly for 2 weeks, then use as needed for itching. 10 mL 2   albuterol (PROAIR HFA) 108 (90 Base) MCG/ACT inhaler Inhale 2 puffs by mouth every 6 hours as needed 18 g 0   albuterol (VENTOLIN HFA) 108 (90 Base) MCG/ACT inhaler Inhale 2 puffs into the lungs every 4 to 6 hours as needed. 6.7 g 1   azelastine (ASTELIN) 0.1 % nasal spray Place into the nose.     B Complex-C-E-Zn (B COMPLEX-C-E-ZINC) tablet Take by mouth.     benzonatate (TESSALON) 200 MG capsule Take 1 capsule by mouth three times a day as needed for cough 21 capsule 0   buPROPion (WELLBUTRIN XL) 150 MG 24 hr tablet TK 2 TS PO Q MORNING      buPROPion (WELLBUTRIN XL) 150 MG 24 hr tablet TAKE 1 TABLET BY MOUTH EVERY MORNING WITH 300 MG TABLET 30 tablet 0   buPROPion (WELLBUTRIN XL) 300 MG 24 hr tablet Take 300 mg by mouth daily.     buPROPion (WELLBUTRIN XL) 300 MG 24 hr tablet TAKE 1 TABLET BY MOUTH EVERY MORNING 90 tablet 1   buPROPion (WELLBUTRIN XL) 300 MG 24 hr tablet TAKE 1 TABLET BY MOUTH EVERY MORNING 90 tablet 1   buPROPion (WELLBUTRIN XL) 300 MG 24 hr tablet TAKE 1 TABLET BY MOUTH EVERY MORNING 90 tablet 1   buPROPion (WELLBUTRIN XL) 300 MG 24 hr tablet Take 1 tablet by mouth every morning. 90 tablet 1   buPROPion (WELLBUTRIN XL) 300 MG 24 hr tablet Take 1 tablet by mouth every morning 90 tablet 1   buPROPion (WELLBUTRIN XL) 300 MG 24 hr tablet TAKE 1 TABLET BY MOUTH ONCE EVERY MORNING. 90 tablet 2   buPROPion (WELLBUTRIN XL) 300 MG 24 hr tablet Take 1 tablet (300 mg total) by mouth every morning. 90 tablet 1   buPROPion (WELLBUTRIN XL) 300 MG 24 hr tablet Take 1 tablet (300 mg total) by mouth every morning. 90 tablet 1   Calcium Carbonate-Vitamin D (CALCIUM 500 + D)  500-125 MG-UNIT TABS Take 1 tablet by mouth daily.     cephALEXin (KEFLEX) 500 MG capsule Take 1 capsule (500 mg total) by mouth 4 (four) times daily. 20 capsule 0   COVID-19 mRNA bivalent vaccine, Pfizer, (PFIZER COVID-19 VAC BIVALENT) injection Inject into the muscle. 0.3 mL 0   diclofenac (VOLTAREN) 75 MG EC tablet as needed.     doxycycline (DORYX) 100 MG EC tablet Take 1 tablet (100 mg total) by mouth 2 (two) times daily. 14 tablet 0   doxycycline (MONODOX) 100 MG capsule Take 1 capsule by mouth twice a day for 7 days. 14 capsule 0   doxycycline (VIBRA-TABS) 100 MG tablet Take 1 tablet by mouth twice daily for 14 days 14 tablet 0   doxycycline (VIBRAMYCIN) 100 MG capsule Take 1 capsule by mouth twice a day for 10 days 20 capsule 0   estradiol (ESTRACE) 0.5 MG tablet Take 0.5 tablets (0.25 mg total) by mouth every other day. 45 tablet 4   estradiol (ESTRACE)  0.5 MG tablet Take 1/2 tablet (0.25 mg total) by mouth every other day. 45 tablet 4   estradiol (ESTRACE) 0.5 MG tablet Take 0.5 tablets (0.25 mg total) by mouth every other day. 45 tablet 4   estradiol (ESTRACE) 0.5 MG tablet Take 1/2 tablet (0.25 mg total) by mouth daily. 90 tablet 4   estradiol (ESTRACE) 1 MG tablet TAKE 1 TABLET (1 MG TOTAL) BY MOUTH DAILY. (Patient taking differently: Take 1 mg by mouth at bedtime. ) 90 tablet 0   Estradiol (VAGIFEM) 10 MCG TABS vaginal tablet Insert 1 tablet vaginally two times a week 8 tablet 2   Estradiol (VAGIFEM) 10 MCG TABS vaginal tablet Insert 1 tablet vaginally 2 times a week 8 tablet 4   ferrous sulfate 325 (65 FE) MG tablet Take 325 mg by mouth 3 (three) times a week.      FETZIMA 20 MG CP24 Take 40 mg by mouth every other day.      fish oil-omega-3 fatty acids 1000 MG capsule Take 1 g by mouth daily.     fluticasone (FLONASE) 50 MCG/ACT nasal spray      fluticasone (FLONASE) 50 MCG/ACT nasal spray USE 2 SPRAYS IN EACH NOSTRIL EACH NIGHT 16 g 11   fluticasone (FLONASE) 50 MCG/ACT nasal spray Use 2 sprays in each nostril at bedtime. 16 g 11   fluticasone (FLONASE) 50 MCG/ACT nasal spray Place 2 sprays into affected nostril(s) at bedtime. 16 g 11   Glucosamine Sulfate 500 MG CAPS Take by mouth.     HYDROcodone-acetaminophen (NORCO/VICODIN) 5-325 MG tablet Take 1-2 tablets by mouth every 6 (six) hours as needed. 10 tablet 0   ibuprofen (ADVIL) 800 MG tablet Take 1 tablet (800 mg total) by mouth 2 (two) times daily as needed. 40 tablet 0   ketoconazole (NIZORAL) 2 % shampoo Use externally as directed once a day for 28 days. 120 mL 1   levocetirizine (XYZAL) 5 MG tablet Take 1 tablet by mouth once daily in the evening 30 tablet 1   Levomilnacipran HCl ER (FETZIMA) 20 MG CP24 Take 1 capsule by mouth every morning 90 capsule 1   Levomilnacipran HCl ER (FETZIMA) 20 MG CP24 TAKE 1 CAPSULE BY MOUTH ONCE EVERY MORNING. 90 capsule 2   Levomilnacipran HCl ER  (FETZIMA) 40 MG CP24 Take 1 capsule by mouth every morning. 90 capsule 2   Levomilnacipran HCl ER (FETZIMA) 40 MG CP24 Take 1 capsule by mouth every morning. 30 capsule  2   Levomilnacipran HCl ER (FETZIMA) 40 MG CP24 Take 1 capsule (40 mg) by mouth every morning. 90 capsule 2   Levomilnacipran HCl ER 20 MG CP24 Take 1 capsule by mouth once a day 14 capsule 1   Levomilnacipran HCl ER 20 MG CP24 Take 1 capsule (20 mg) by mouth every morning. 90 capsule 1   Levomilnacipran HCl ER 20 MG CP24 Take 1 capsule by mouth every morning. 90 capsule 0   lidocaine (XYLOCAINE) 2 % solution Gargle and spit 5-10 mLs 3-4 hours as needed for throat pain. 100 mL 0   meloxicam (MOBIC) 15 MG tablet Take 1 tablet by mouth every day. 30 tablet 0   mometasone (ELOCON) 0.1 % cream Apply 1 application topically daily.     mometasone (ELOCON) 0.1 % cream Apply to affected area 2 times a day for 7 days 15 g 1   Multiple Vitamin (MULITIVITAMIN WITH MINERALS) TABS Take 1 tablet by mouth daily.     mupirocin ointment (BACTROBAN) 2 % Apply to left nostril twice daily for 1 week as directed. 22 g 0   nirmatrelvir & ritonavir (PAXLOVID, 300/100,) 20 x 150 MG & 10 x 100MG  TBPK Take 3 tablets by mouth 2 (two) times daily. 30 tablet 0   nortriptyline (PAMELOR) 25 MG capsule TAKE 1 CAPSULE BY MOUTH AT BEDTIME FOR 1 WEEK, THEN TAKE 2 CAPSULES AT BEDTIME. 60 capsule 4   nortriptyline (PAMELOR) 25 MG capsule TAKE 1 CAPSULE BY MOUTH AT BEDTIME 30 capsule 4   nystatin cream (MYCOSTATIN) Apply to affected area 2 times a day 30 g 6   nystatin ointment (MYCOSTATIN) Apply to affected area 2 times a day for 7 days 15 g 0   nystatin ointment (MYCOSTATIN) Apply to affected area 2 times a day for 7 days 15 g 0   nystatin-triamcinolone (MYCOLOG II) cream Apply 1 application topically daily.     predniSONE (DELTASONE) 10 MG tablet Take 1 tablet (10 mg total) by mouth daily. 7 tablet 0   predniSONE (DELTASONE) 20 MG tablet Take 2 tablets daily for 2  days, then 1 tablet daily for 2 days, then 1/2 tablet daily for 2 days.  Take with food or milk 7 tablet 0   Probiotic Product (PROBIOTIC DAILY PO) Take 1 Can by mouth daily.     progesterone (PROMETRIUM) 100 MG capsule Take 100 mg by mouth at bedtime.     progesterone (PROMETRIUM) 100 MG capsule Take 1 capsule (100 mg total) by mouth every other day. 45 capsule 3   progesterone (PROMETRIUM) 100 MG capsule Take 1 capsule (100 mg total) by mouth every other day. 45 capsule 3   sulindac (CLINORIL) 150 MG tablet Take by mouth.     tiZANidine (ZANAFLEX) 4 MG tablet Take 1-2 tablets (4-8 mg total) by mouth every 6 (six) hours as needed for muscle spasms. 21 tablet 0   tobramycin-dexamethasone (TOBRADEX) ophthalmic solution Place 3 drops into the right eye daily as directed for 7 days. 5 mL 0   tretinoin (RETIN-A) 0.025 % cream Apply a pea sized amount to the face once daily in the evening 45 g 1   triamcinolone cream (KENALOG) 0.5 % Apply 1 application twice a day for 7 days 15 g 0   VITAMIN D PO Take by mouth.     No current facility-administered medications on file prior to visit.    Past Surgical History:  Procedure Laterality Date   COLONOSCOPY  last 08/16/2014  EYE SURGERY Right 05/2019   Cataract   KNEE ARTHROSCOPY     KNEE ARTHROSCOPY     laproscopy     POLYPECTOMY     TONSILLECTOMY      Allergies  Allergen Reactions   Colace  [Docusate Calcium] Other (See Comments)    cramping   Morphine And Codeine     Other reaction(s): Redness IV   Other Rash    Medical tape, band aids   Penicillins Hives and Rash    Did it involve swelling of the face/tongue/throat, SOB, or low BP? No Did it involve sudden or severe rash/hives, skin peeling, or any reaction on the inside of your mouth or nose? No Did you need to seek medical attention at a hospital or doctor's office? No When did it last happen?      4 years ago If all above answers are "NO", may proceed with cephalosporin use.    Zithromax [Azithromycin] Rash and Hives   Dulcolax [Bisacodyl]     Dehydration    Effexor [Venlafaxine]     Intolerant SSRI/SNRI   Erythromycin Nausea And Vomiting   Prednisone Other (See Comments)    Paranoid , anxious, hyper emotionally, could not sleep, odd dreams Causes severe anxiety and insomnia.   Tape Dermatitis    BP 120/78 (BP Location: Left Arm, Patient Position: Sitting)   Ht 5\' 6"  (1.676 m)   Wt 165 lb (74.8 kg)   LMP 10/28/2008   BMI 26.63 kg/m      02/06/2022    3:23 PM  [facility]  Frequency of aerobic exercise (# of days/week) 2  Average time in minutes 15  Frequency of strengthening activities (# of days/week) 1        No data to display              Objective:  Physical Exam:  Gen: NAD, comfortable in exam room  Right hip: No deformity. FROM with 5/5 strength including hip abduction. Tenderness to palpation within proximal gluteus medius and maximus.  Minimal tenderness greater trochanter but not area of her typical pain. NVI distally. Negative logroll Negative faber, fadir, and piriformis stretches.   Assessment & Plan:  1. Right hip pain - 2/2 strain of gluteus medius and maximus.  Rest, icing, aleve.  Avoid squats, lunges, rower for next 2-3 weeks.  Pain as guide for other activities.  F/u in 1 month.  Consider formal physical therapy - home exercises reviewed today to start in a week.

## 2023-04-01 ENCOUNTER — Other Ambulatory Visit: Payer: Self-pay

## 2023-04-08 ENCOUNTER — Encounter: Payer: Self-pay | Admitting: Family Medicine

## 2023-04-11 ENCOUNTER — Other Ambulatory Visit: Payer: Self-pay

## 2023-04-11 ENCOUNTER — Other Ambulatory Visit (HOSPITAL_COMMUNITY): Payer: Self-pay

## 2023-04-21 ENCOUNTER — Other Ambulatory Visit (HOSPITAL_COMMUNITY): Payer: Self-pay

## 2023-04-28 ENCOUNTER — Other Ambulatory Visit (HOSPITAL_COMMUNITY): Payer: Self-pay

## 2023-04-28 MED ORDER — BUPROPION HCL ER (XL) 300 MG PO TB24
300.0000 mg | ORAL_TABLET | Freq: Every morning | ORAL | 1 refills | Status: DC
  Filled 2023-04-28: qty 30, 30d supply, fill #0
  Filled 2023-06-24: qty 30, 30d supply, fill #1
  Filled 2023-07-26: qty 30, 30d supply, fill #2
  Filled 2023-08-24: qty 30, 30d supply, fill #3
  Filled 2023-09-30 (×2): qty 30, 30d supply, fill #4
  Filled 2023-10-30: qty 30, 30d supply, fill #5

## 2023-04-28 MED ORDER — FETZIMA 40 MG PO CP24
1.0000 | ORAL_CAPSULE | Freq: Every morning | ORAL | 2 refills | Status: DC
  Filled 2023-06-18: qty 30, 30d supply, fill #0
  Filled 2023-07-23: qty 30, 30d supply, fill #1
  Filled 2023-08-22: qty 30, 30d supply, fill #2

## 2023-04-29 ENCOUNTER — Other Ambulatory Visit (HOSPITAL_COMMUNITY): Payer: Self-pay

## 2023-05-06 ENCOUNTER — Other Ambulatory Visit (HOSPITAL_COMMUNITY): Payer: Self-pay

## 2023-05-07 ENCOUNTER — Other Ambulatory Visit (HOSPITAL_COMMUNITY): Payer: Self-pay

## 2023-05-21 ENCOUNTER — Other Ambulatory Visit (HOSPITAL_COMMUNITY): Payer: Self-pay

## 2023-05-23 ENCOUNTER — Encounter: Payer: Self-pay | Admitting: Family Medicine

## 2023-05-23 ENCOUNTER — Other Ambulatory Visit (HOSPITAL_COMMUNITY): Payer: Self-pay

## 2023-05-23 ENCOUNTER — Ambulatory Visit: Payer: 59 | Admitting: Family Medicine

## 2023-05-23 VITALS — BP 118/80 | Ht 66.0 in | Wt 160.0 lb

## 2023-05-23 DIAGNOSIS — M21622 Bunionette of left foot: Secondary | ICD-10-CM

## 2023-05-23 DIAGNOSIS — M21621 Bunionette of right foot: Secondary | ICD-10-CM | POA: Diagnosis not present

## 2023-05-23 NOTE — Progress Notes (Signed)
  Megan Combs - 62 y.o. female MRN [id number]  Date of birth: April 16, 1961    SUBJECTIVE:      Chief Complaint:/ HPI:  Right foot pain worse over the last 1 to 2 months.  Is also noticed that that area looks swollen.  Pain worse if she stands a lot, wears shoes or has to walk a lot ; no numbness or tingling.    OBJECTIVE: BP 118/80   Ht 5\' 6"  (1.676 m)   Wt 160 lb (72.6 kg)   LMP 10/28/2008   BMI 25.82 kg/m   Physical Exam:  Vital signs are reviewed. 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  She also has fifth metatarsal bunion forming bilaterally right greater than left.  ASSESSMENT & PLAN:  See problem based charting & AVS for pt instructions. No problem-specific Assessment & Plan notes found for this encounter. #1.  Tailor's bunions: Plan: She already has orthotics so I added a small metatarsal pad to both of those.  She had some improvement.  We discussed the anatomy of foot and natural loss of structures as we age.  Recommended alteration of footwear.  She would like to avoid any surgical intervention if she could.  Follow-up as needed.

## 2023-05-28 ENCOUNTER — Other Ambulatory Visit: Payer: Self-pay

## 2023-06-18 ENCOUNTER — Other Ambulatory Visit (HOSPITAL_COMMUNITY): Payer: Self-pay

## 2023-06-19 ENCOUNTER — Other Ambulatory Visit (HOSPITAL_COMMUNITY): Payer: Self-pay

## 2023-06-20 ENCOUNTER — Other Ambulatory Visit (HOSPITAL_COMMUNITY): Payer: Self-pay

## 2023-06-21 ENCOUNTER — Other Ambulatory Visit (HOSPITAL_COMMUNITY): Payer: Self-pay

## 2023-06-24 ENCOUNTER — Other Ambulatory Visit (HOSPITAL_COMMUNITY): Payer: Self-pay

## 2023-06-26 ENCOUNTER — Other Ambulatory Visit (HOSPITAL_COMMUNITY): Payer: Self-pay

## 2023-07-11 ENCOUNTER — Other Ambulatory Visit (HOSPITAL_COMMUNITY): Payer: Self-pay

## 2023-07-16 ENCOUNTER — Other Ambulatory Visit (HOSPITAL_COMMUNITY): Payer: Self-pay

## 2023-07-18 ENCOUNTER — Other Ambulatory Visit (HOSPITAL_COMMUNITY): Payer: Self-pay

## 2023-07-21 ENCOUNTER — Other Ambulatory Visit (HOSPITAL_COMMUNITY): Payer: Self-pay

## 2023-07-21 MED ORDER — DOXYCYCLINE HYCLATE 100 MG PO CAPS
100.0000 mg | ORAL_CAPSULE | Freq: Two times a day (BID) | ORAL | 0 refills | Status: DC
  Filled 2023-07-21: qty 20, 10d supply, fill #0

## 2023-07-21 MED ORDER — ALBUTEROL SULFATE HFA 108 (90 BASE) MCG/ACT IN AERS
2.0000 | INHALATION_SPRAY | RESPIRATORY_TRACT | 1 refills | Status: DC | PRN
  Filled 2023-07-21: qty 6.7, 16d supply, fill #0

## 2023-07-22 ENCOUNTER — Other Ambulatory Visit (HOSPITAL_COMMUNITY): Payer: Self-pay

## 2023-07-22 MED ORDER — NYSTATIN 100000 UNIT/GM EX CREA
TOPICAL_CREAM | CUTANEOUS | 6 refills | Status: DC
  Filled 2023-07-22: qty 30, 10d supply, fill #0
  Filled 2023-11-06: qty 30, 10d supply, fill #1

## 2023-07-24 ENCOUNTER — Other Ambulatory Visit: Payer: Self-pay

## 2023-07-24 ENCOUNTER — Other Ambulatory Visit (HOSPITAL_COMMUNITY): Payer: Self-pay

## 2023-07-28 ENCOUNTER — Other Ambulatory Visit (HOSPITAL_COMMUNITY): Payer: Self-pay

## 2023-07-30 ENCOUNTER — Other Ambulatory Visit (HOSPITAL_COMMUNITY): Payer: Self-pay

## 2023-07-31 ENCOUNTER — Other Ambulatory Visit (HOSPITAL_COMMUNITY): Payer: Self-pay

## 2023-07-31 MED ORDER — FLUTICASONE PROPIONATE 50 MCG/ACT NA SUSP
2.0000 | Freq: Every evening | NASAL | 5 refills | Status: AC
  Filled 2023-07-31: qty 16, 30d supply, fill #0
  Filled 2023-09-14: qty 16, 30d supply, fill #1
  Filled 2023-10-30: qty 16, 30d supply, fill #2
  Filled 2023-12-16: qty 16, 30d supply, fill #3
  Filled 2024-02-01: qty 16, 30d supply, fill #4
  Filled 2024-03-19: qty 16, 30d supply, fill #5

## 2023-08-04 ENCOUNTER — Other Ambulatory Visit (HOSPITAL_COMMUNITY): Payer: Self-pay

## 2023-08-10 ENCOUNTER — Other Ambulatory Visit (HOSPITAL_COMMUNITY): Payer: Self-pay

## 2023-08-11 ENCOUNTER — Other Ambulatory Visit (HOSPITAL_COMMUNITY): Payer: Self-pay

## 2023-08-11 MED ORDER — PROGESTERONE MICRONIZED 100 MG PO CAPS
100.0000 mg | ORAL_CAPSULE | ORAL | 3 refills | Status: DC
  Filled 2023-08-11: qty 15, 30d supply, fill #0
  Filled 2023-09-16: qty 15, 30d supply, fill #1
  Filled 2023-10-13: qty 15, 30d supply, fill #2
  Filled 2023-11-13: qty 15, 30d supply, fill #3

## 2023-08-15 ENCOUNTER — Other Ambulatory Visit (HOSPITAL_BASED_OUTPATIENT_CLINIC_OR_DEPARTMENT_OTHER): Payer: Self-pay

## 2023-08-15 MED ORDER — COMIRNATY 30 MCG/0.3ML IM SUSY
0.3000 mL | PREFILLED_SYRINGE | Freq: Once | INTRAMUSCULAR | 0 refills | Status: AC
  Filled 2023-08-15: qty 0.3, 1d supply, fill #0

## 2023-08-21 ENCOUNTER — Other Ambulatory Visit (HOSPITAL_COMMUNITY): Payer: Self-pay

## 2023-08-21 ENCOUNTER — Other Ambulatory Visit: Payer: Self-pay

## 2023-08-22 ENCOUNTER — Other Ambulatory Visit: Payer: Self-pay

## 2023-08-25 ENCOUNTER — Other Ambulatory Visit (HOSPITAL_COMMUNITY): Payer: Self-pay

## 2023-08-26 ENCOUNTER — Other Ambulatory Visit (HOSPITAL_COMMUNITY): Payer: Self-pay

## 2023-08-26 MED ORDER — LAGEVRIO 200 MG PO CAPS
4.0000 | ORAL_CAPSULE | Freq: Two times a day (BID) | ORAL | 0 refills | Status: DC
  Filled 2023-08-26: qty 40, 5d supply, fill #0

## 2023-08-26 MED ORDER — CLINDAMYCIN HCL 300 MG PO CAPS
300.0000 mg | ORAL_CAPSULE | Freq: Two times a day (BID) | ORAL | 0 refills | Status: DC
  Filled 2023-08-26: qty 14, 7d supply, fill #0

## 2023-09-01 ENCOUNTER — Ambulatory Visit: Payer: 59 | Admitting: Cardiology

## 2023-09-14 ENCOUNTER — Other Ambulatory Visit (HOSPITAL_COMMUNITY): Payer: Self-pay

## 2023-09-15 ENCOUNTER — Other Ambulatory Visit (HOSPITAL_COMMUNITY): Payer: Self-pay

## 2023-09-15 MED ORDER — ESTRADIOL 10 MCG VA TABS
1.0000 | ORAL_TABLET | VAGINAL | 4 refills | Status: DC
  Filled 2023-09-15: qty 8, 28d supply, fill #0

## 2023-09-15 MED ORDER — CEPHALEXIN 500 MG PO CAPS
500.0000 mg | ORAL_CAPSULE | Freq: Three times a day (TID) | ORAL | 0 refills | Status: DC
  Filled 2023-09-15: qty 21, 7d supply, fill #0

## 2023-09-16 ENCOUNTER — Other Ambulatory Visit (HOSPITAL_COMMUNITY): Payer: Self-pay

## 2023-09-17 ENCOUNTER — Other Ambulatory Visit (HOSPITAL_COMMUNITY): Payer: Self-pay

## 2023-09-20 ENCOUNTER — Other Ambulatory Visit (HOSPITAL_COMMUNITY): Payer: Self-pay

## 2023-09-22 ENCOUNTER — Other Ambulatory Visit: Payer: Self-pay

## 2023-09-22 ENCOUNTER — Other Ambulatory Visit (HOSPITAL_COMMUNITY): Payer: Self-pay

## 2023-09-24 ENCOUNTER — Other Ambulatory Visit (HOSPITAL_COMMUNITY): Payer: Self-pay

## 2023-09-30 ENCOUNTER — Other Ambulatory Visit (HOSPITAL_COMMUNITY): Payer: Self-pay

## 2023-09-30 MED ORDER — DOXYCYCLINE HYCLATE 100 MG PO TABS
100.0000 mg | ORAL_TABLET | Freq: Two times a day (BID) | ORAL | 0 refills | Status: DC
  Filled 2023-09-30: qty 10, 5d supply, fill #0

## 2023-09-30 MED ORDER — MUPIROCIN 2 % EX OINT
TOPICAL_OINTMENT | Freq: Two times a day (BID) | CUTANEOUS | 0 refills | Status: DC
  Filled 2023-09-30: qty 22, 7d supply, fill #0

## 2023-10-16 ENCOUNTER — Other Ambulatory Visit: Payer: Self-pay

## 2023-10-16 ENCOUNTER — Other Ambulatory Visit (HOSPITAL_COMMUNITY): Payer: Self-pay

## 2023-10-17 ENCOUNTER — Other Ambulatory Visit: Payer: Self-pay

## 2023-10-20 ENCOUNTER — Other Ambulatory Visit (HOSPITAL_COMMUNITY): Payer: Self-pay

## 2023-11-01 ENCOUNTER — Other Ambulatory Visit (HOSPITAL_COMMUNITY): Payer: Self-pay

## 2023-11-10 ENCOUNTER — Other Ambulatory Visit (HOSPITAL_COMMUNITY): Payer: Self-pay

## 2023-11-10 MED ORDER — BUPROPION HCL ER (XL) 300 MG PO TB24
300.0000 mg | ORAL_TABLET | Freq: Every morning | ORAL | 1 refills | Status: AC
  Filled 2023-11-10: qty 90, 90d supply, fill #0
  Filled 2023-12-01: qty 30, 30d supply, fill #0
  Filled 2023-12-31: qty 30, 30d supply, fill #1
  Filled 2024-02-06: qty 30, 30d supply, fill #2
  Filled 2024-03-07: qty 30, 30d supply, fill #3

## 2023-11-11 ENCOUNTER — Other Ambulatory Visit (HOSPITAL_COMMUNITY): Payer: Self-pay

## 2023-11-11 ENCOUNTER — Other Ambulatory Visit: Payer: Self-pay

## 2023-11-11 MED ORDER — FETZIMA 40 MG PO CP24
1.0000 | ORAL_CAPSULE | Freq: Every morning | ORAL | 2 refills | Status: DC
  Filled 2023-11-11: qty 30, 30d supply, fill #0

## 2023-11-12 ENCOUNTER — Other Ambulatory Visit: Payer: Self-pay

## 2023-11-25 ENCOUNTER — Other Ambulatory Visit (HOSPITAL_COMMUNITY): Payer: Self-pay

## 2023-11-25 MED ORDER — ESTRADIOL 0.5 MG PO TABS
0.2500 mg | ORAL_TABLET | Freq: Every day | ORAL | 4 refills | Status: AC
  Filled 2023-11-25: qty 15, 30d supply, fill #0
  Filled 2023-12-31: qty 15, 30d supply, fill #1
  Filled 2024-01-31: qty 15, 30d supply, fill #2
  Filled 2024-03-03: qty 15, 30d supply, fill #3
  Filled 2024-03-22 – 2024-03-26 (×2): qty 15, 30d supply, fill #4
  Filled 2024-04-29: qty 15, 30d supply, fill #5
  Filled 2024-05-28: qty 15, 30d supply, fill #6
  Filled 2024-06-28: qty 15, 30d supply, fill #7
  Filled 2024-07-18 – 2024-07-21 (×2): qty 15, 30d supply, fill #8
  Filled 2024-08-17: qty 15, 30d supply, fill #9
  Filled 2024-10-11: qty 15, 30d supply, fill #10

## 2023-12-01 ENCOUNTER — Other Ambulatory Visit (HOSPITAL_COMMUNITY): Payer: Self-pay

## 2023-12-04 ENCOUNTER — Other Ambulatory Visit: Payer: Self-pay | Admitting: *Deleted

## 2023-12-04 ENCOUNTER — Encounter: Payer: Self-pay | Admitting: Cardiology

## 2023-12-04 ENCOUNTER — Ambulatory Visit: Payer: 59 | Attending: Cardiology | Admitting: Cardiology

## 2023-12-04 VITALS — BP 138/82 | HR 89 | Ht 66.0 in | Wt 172.2 lb

## 2023-12-04 DIAGNOSIS — E7800 Pure hypercholesterolemia, unspecified: Secondary | ICD-10-CM

## 2023-12-04 DIAGNOSIS — I493 Ventricular premature depolarization: Secondary | ICD-10-CM | POA: Diagnosis not present

## 2023-12-04 DIAGNOSIS — Z79899 Other long term (current) drug therapy: Secondary | ICD-10-CM | POA: Diagnosis not present

## 2023-12-04 DIAGNOSIS — Z8249 Family history of ischemic heart disease and other diseases of the circulatory system: Secondary | ICD-10-CM | POA: Diagnosis not present

## 2023-12-04 NOTE — Progress Notes (Signed)
 Cardiology Consult Note  Date:  12/04/2023   ID:  Megan Combs, DOB 1961/08/12, MRN [id number]  PCP:  Combs Charm, MD  Cardiologist:  None    Referring MD: Combs Megan,*   Chief Complaint  Patient presents with   New Patient (Initial Visit)    Fm hx of premature CAD, HLD and PVCs   This is a 64yo female with a hx of fm hx of prematuer CAD, HLD and PVCs who was seen remotely by cardiology but lost to followup and is now referred back to reestablish Card[facility] care by her PCP  Megan Elliot, MD  History of Present Illness:    Megan Combs is a 63 y.o. female with a hx of dyslipidemia, family history of CAD and palpitations with heart monitor showing PVC's. She  is doing well.  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    She is concerned that her cholesterol has been creeping up and her PCP was concerned that she needed to start meds but she wanted to try diet and exercise. She recently saw a nutritionist and is on a plant based diet but has not followed it very well and will last lipid check her levels were higher than they were before.  She still wanted to avoid taking statins and started a Mediterranean diet and has been very good following it and has not eaten beef in the past 3 months. She has not had her lipids checked since then.    She denies any chest pain or pressure, SOB, DOE, PND, orthopnea, dizziness, palpitations or syncope. Occasionally will have some mild LE edema at the end of the day. She is compliant with her meds and is tolerating meds with no SE.    Past Medical History:  Diagnosis Date   Allergy    Anemia    Anxiety    Arthritis    right knee   Degenerative disc disease    Depression    Family history of early CAD    Fibromyalgia    Hyperlipidemia    IBS (irritable bowel syndrome)    Lumbar radiculitis    Plantar fasciitis of left foot    PVC (premature ventricular contraction)     Salmonella poisoning     Past Surgical History:  Procedure Laterality Date   COLONOSCOPY  last 08/16/2014   EYE SURGERY Right 05/2019   Cataract   KNEE ARTHROSCOPY     KNEE ARTHROSCOPY     laproscopy     POLYPECTOMY     TONSILLECTOMY      Current Medications: Current Meds  Medication Sig   acetic acid -hydrocortisone  (VOSOL -HC) OTIC solution Place 4 drops into both ears nightly for 2 weeks, then use as needed for itching.   albuterol  (PROAIR  HFA) 108 (90 Base) MCG/ACT inhaler Inhale 2 puffs by mouth every 6 hours as needed   azelastine  (ASTELIN ) 0.1 % nasal spray Place into the nose.   B Complex-C-E-Zn (B COMPLEX-C-E-ZINC) tablet Take by mouth.   buPROPion  (WELLBUTRIN  XL) 300 MG 24 hr tablet Take 1 tablet (300 mg total) by mouth in the morning.   Calcium Carbonate-Vitamin D  (CALCIUM 500 + D) 500-125 MG-UNIT TABS Take 1 tablet by mouth daily.   diclofenac  (VOLTAREN ) 75 MG EC tablet as needed.   estradiol  (ESTRACE ) 0.5 MG tablet Take 0.5 tablets (0.25 mg total) by mouth daily.   estradiol  (ESTRACE ) 1 MG tablet TAKE 1 TABLET (1 MG TOTAL) BY MOUTH DAILY. (Patient  taking differently: Take 1 mg by mouth at bedtime.)   fluticasone  (FLONASE ) 50 MCG/ACT nasal spray Place 2 sprays into both nostrils every evening.   Glucosamine Sulfate 500 MG CAPS Take by mouth.   Levomilnacipran  HCl ER (FETZIMA ) 40 MG CP24 Take 1 capsule by mouth every morning.   Multiple Vitamin (MULITIVITAMIN WITH MINERALS) TABS Take 1 tablet by mouth daily.   nystatin  ointment (MYCOSTATIN ) Apply to affected area 2 times a day for 7 days     Allergies:   Colace  [docusate calcium], Morphine and codeine, Other, Penicillins, Zithromax  [azithromycin ], Dulcolax [bisacodyl], Effexor  [venlafaxine ], Erythromycin, Prednisone , and Tape   Social History   Socioeconomic History   Marital status: Married    Spouse name: Not on file   Number of children: 0   Years of education: Not on file   Highest education level: Not on file   Occupational History   Occupation: Teacher, Adult Education: [employer]  Tobacco Use   Smoking status: Never   Smokeless tobacco: Never  Vaping Use   Vaping status: Never Used  Substance and Sexual Activity   Alcohol use: No    Alcohol/week: 0.0 standard drinks of alcohol   Drug use: No   Sexual activity: Yes    Birth control/protection: Post-menopausal  Other Topics Concern   Not on file  Social History Narrative   Not on file   Social Drivers of Health   Financial Resource Strain: Not on file  Food Insecurity: Low Risk  (03/11/2023)   [facility]   Hunger Vital Sign    Worried About Running Out of Food in the Last Year: Never true    Ran Out of Food in the Last Year: Never true  Transportation Needs: No Transportation Needs (03/11/2023)   [facility]   Transportation    In the past 12 months, has lack of reliable transportation kept you from medical appointments, meetings, work or from getting things needed for daily living? : No  Physical Activity: Not on file  Stress: Not on file  Social Connections: Unknown (03/11/2022)   [facility]   Social Network    Social Network: Not on file     Family History: The patient's family history includes Arthritis in her mother; Cancer in her father and maternal grandmother; Colon cancer in her father; Colon polyps in her father; Diabetes in her father, maternal grandfather, and paternal grandfather; Esophageal cancer in her father; Heart attack in her mother; Heart disease in her father, maternal grandfather, mother, and paternal grandfather; Hyperlipidemia in her brother and father; Hypertension in her brother and father; Mental illness in her maternal grandfather and mother; Prostate cancer in her father; Stomach cancer in her father; Stroke in her father and maternal grandfather. There is no history of Pancreatic  cancer or Rectal cancer.  ROS:   Please see the history of present illness.    ROS  All other systems reviewed and negative.   EKGs/Labs/Other Studies Reviewed:    The following studies were reviewed today: EKG Interpretation Date/Time:  Thursday December 04 2023 08:55:24 EST Ventricular Rate:  89 PR Interval:  130 QRS Duration:  90 QT Interval:  344 QTC Calculation: 418 R Axis:   90  Text Interpretation: Normal sinus rhythm Rightward axis When compared with ECG of 23-Sep-2022 13:36, PREVIOUS ECG IS PRESENT Confirmed by Shlomo Corning ([id number]) on 12/04/2023 8:59:16  AM    Recent Labs: No results found for requested labs within last 365 days.   Recent Lipid Panel    Component Value Date/Time   CHOL 192 09/05/2017 0812   TRIG 79 09/05/2017 0812   HDL 80 09/05/2017 0812   CHOLHDL 2.4 09/05/2017 0812   CHOLHDL 2.3 07/26/2016 0811   VLDL 10 07/26/2016 0811   LDLCALC 96 09/05/2017 0812    Physical Exam:    VS:  BP 138/82   Pulse 89   Ht 5' 6 (1.676 m)   Wt 172 lb 3.2 oz (78.1 kg)   LMP 10/28/2008   BMI 27.79 kg/m     Wt Readings from Last 3 Encounters:  12/04/23 172 lb 3.2 oz (78.1 kg)  05/23/23 160 lb (72.6 kg)  03/25/23 165 lb (74.8 kg)     GEN: Well nourished, well developed in no acute distress HEENT: Normal NECK: No JVD; No carotid bruits LYMPHATICS: No lymphadenopathy CARDIAC:RRR, no murmurs, rubs, gallops RESPIRATORY:  Clear to auscultation without rales, wheezing or rhonchi  ABDOMEN: Soft, non-tender, non-distended MUSCULOSKELETAL:  No edema; No deformity  SKIN: Warm and dry NEUROLOGIC:  Alert and oriented x 3 PSYCHIATRIC:  Normal affect  ASSESSMENT:    1. PVC (premature ventricular contraction)   2. Pure hypercholesterolemia   3. Family history of early CAD     PLAN:    In order of problems listed above:  PVCs  -She has had documented PVCs in the past but she denies any palpitations recently  HLD  - LDL goal < 100 - I have personally  reviewed and interpreted outside labs performed by patient's PCP which showed LDL 129, HDL 79 on 1.6.25 and ALT 14 on 08/04/23 -since her last lipids she has been following a strict Mediterranean diet and would like her lipids rechecked -Given her family history of CAD and her history of hyperlipidemia I recommended getting a coronary calcium score to assess future cardiac risk -If she is found to have coronary calcium then she needs to be placed on statin therapy if LDL >70 -check FLP and ALT  Fm hx of CAD  - she has not had any CP or SOB.   - Nuclear stress test in 2017 showed no ischemia. - I will get a coronary Ca score to assess future cardiac risk   Medication Adjustments/Labs and Tests Ordered: Current medicines are reviewed at length with the patient today.  Concerns regarding medicines are outlined above.  Orders Placed This Encounter  Procedures   EKG 12-Lead   No orders of the defined types were placed in this encounter.   Signed, Wilbert Bihari, MD  12/04/2023 9:06 AM    [facility]

## 2023-12-04 NOTE — Patient Instructions (Signed)
 Medication Instructions:  The current medical regimen is effective;  continue present plan and medications.  *If you need a refill on your cardiac medications before your next appointment, please call your pharmacy*  Lab Work: Please have fasting blood work at your closest [facility] (Lipid, ALT)  If you have labs (blood work) drawn today and your tests are completely normal, you will receive your results only by: MyChart Message (if you have MyChart) OR A paper copy in the mail If you have any lab test that is abnormal or we need to change your treatment, we will call you to review the results.  Testing/Procedures: Your physician has requested that you have a Coronary Calcium score which is completed by CT. Cardiac computed tomography (CT) is a painless test that uses an x-ray machine to take clear, detailed pictures of your heart. There are no instructions for this testing.  You may eat/drink and take your normal medications this day.  The cost of the testing is $99 due at the time of your appointment.  Follow-Up: At [facility], you and your health needs are our priority.  As part of our continuing mission to provide you with exceptional heart care, we have created designated Provider Care Teams.  These Care Teams include your primary Cardiologist (physician) and Advanced Practice Providers (APPs -  Physician Assistants and Nurse Practitioners) who all work together to provide you with the care you need, when you need it.  We recommend signing up for the patient portal called MyChart.  Sign up information is provided on this After Visit Summary.  MyChart is used to connect with patients for Virtual Visits (Telemedicine).  Patients are able to view lab/test results, encounter notes, upcoming appointments, etc.  Non-urgent messages can be sent to your provider as well.   To learn more about what you can do with MyChart, go to [email or web address].    Your next appointment:   1  year(s)  Provider:   Dr Wilbert Bihari

## 2023-12-05 LAB — LIPID PANEL
Cholesterol: 164
HDL: 67
Triglycerides: 50 (ref 40–160)

## 2023-12-05 LAB — HEMOGLOBIN A1C: Hemoglobin A1C: 5.6

## 2023-12-05 NOTE — Progress Notes (Signed)
 Provider expressed concerns about hypertension.

## 2023-12-07 ENCOUNTER — Emergency Department (HOSPITAL_BASED_OUTPATIENT_CLINIC_OR_DEPARTMENT_OTHER): Payer: 59 | Admitting: Radiology

## 2023-12-07 ENCOUNTER — Encounter: Payer: Self-pay | Admitting: Cardiology

## 2023-12-07 ENCOUNTER — Emergency Department (HOSPITAL_BASED_OUTPATIENT_CLINIC_OR_DEPARTMENT_OTHER)
Admission: EM | Admit: 2023-12-07 | Discharge: 2023-12-07 | Disposition: A | Discharge status: Home / Self Care | Payer: 59 | Attending: Emergency Medicine | Admitting: Emergency Medicine

## 2023-12-07 ENCOUNTER — Other Ambulatory Visit: Payer: Self-pay

## 2023-12-07 DIAGNOSIS — R0789 Other chest pain: Secondary | ICD-10-CM | POA: Insufficient documentation

## 2023-12-07 DIAGNOSIS — Z79899 Other long term (current) drug therapy: Secondary | ICD-10-CM | POA: Insufficient documentation

## 2023-12-07 DIAGNOSIS — I159 Secondary hypertension, unspecified: Secondary | ICD-10-CM | POA: Insufficient documentation

## 2023-12-07 DIAGNOSIS — I10 Essential (primary) hypertension: Secondary | ICD-10-CM | POA: Insufficient documentation

## 2023-12-07 LAB — CBC
HCT: 37.7 % (ref 36.0–46.0)
Hemoglobin: 12.8 g/dL (ref 12.0–15.0)
MCH: 30.2 pg (ref 26.0–34.0)
MCHC: 34 g/dL (ref 30.0–36.0)
MCV: 88.9 fL (ref 80.0–100.0)
Platelets: 193 10*3/uL (ref 150–400)
RBC: 4.24 MIL/uL (ref 3.87–5.11)
RDW: 13.1 % (ref 11.5–15.5)
WBC: 7.8 10*3/uL (ref 4.0–10.5)
nRBC: 0 % (ref 0.0–0.2)

## 2023-12-07 LAB — TROPONIN I (HIGH SENSITIVITY)
Troponin I (High Sensitivity): 3 ng/L (ref <18)
Troponin I (High Sensitivity): 3 ng/L (ref <18)

## 2023-12-07 LAB — BASIC METABOLIC PANEL
Anion gap: 9 (ref 5–15)
BUN: 23 mg/dL (ref 8–23)
CO2: 27 mmol/L (ref 22–32)
Calcium: 10.2 mg/dL (ref 8.9–10.3)
Chloride: 104 mmol/L (ref 98–111)
Creatinine, Ser: 0.81 mg/dL (ref 0.44–1.00)
GFR, Estimated: >60 mL/min (ref >60)
Glucose, Bld: 84 mg/dL (ref 70–99)
Potassium: 3.8 mmol/L (ref 3.5–5.1)
Sodium: 140 mmol/L (ref 135–145)

## 2023-12-07 MED ORDER — AMLODIPINE BESYLATE 5 MG PO TABS
5.0000 mg | ORAL_TABLET | Freq: Every day | ORAL | 0 refills | Status: DC
  Filled 2023-12-07: qty 30, 30d supply, fill #0

## 2023-12-07 MED ORDER — AMLODIPINE BESYLATE 5 MG PO TABS
5.0000 mg | ORAL_TABLET | Freq: Once | ORAL | Status: AC
  Administered 2023-12-07: 5 mg via ORAL
  Filled 2023-12-07: qty 1

## 2023-12-07 NOTE — ED Notes (Signed)
 Reviewed AVS/discharge instruction with patient. Time allotted for and all questions answered. Patient is agreeable for d/c and escorted to ed exit by staff.

## 2023-12-07 NOTE — ED Triage Notes (Signed)
 C/o left sided chest pain starting today. States she has had elevated BP over the last couple of days. Denies hx of HTN.

## 2023-12-07 NOTE — ED Provider Notes (Signed)
 [place] EMERGENCY DEPARTMENT AT [facility] Provider Note   CSN: [id number] Arrival date & time: 12/07/23  1049     History  Chief Complaint  Patient presents with   Chest Pain    Megan Combs is a 63 y.o. female.  Patient here after some chest discomfort this morning.  She has had some elevated blood pressure here the last few days.  She has not had any exertional pain.  She has been a little bit anxious given that she has had high cholesterol now and some high blood pressures.  She denies any recent surgery or travel.  No shortness of breath.  No history of blood clots.  No fever cough chills.  She is feeling better now.  Denies any weakness numbness tingling.  She is actually set up to get a coronary CT outpatient due to some of her risk factors and high cholesterol.  The history is provided by the patient.       Home Medications Prior to Admission medications   Medication Sig Start Date End Date Taking? Authorizing Provider  amLODipine  (NORVASC ) 5 MG tablet Take 1 tablet (5 mg total) by mouth daily. 12/07/23 01/06/24 Yes Allyssa Abruzzese, DO  acetic acid -hydrocortisone  (VOSOL -HC) OTIC solution Place 4 drops into both ears nightly for 2 weeks, then use as needed for itching. 09/12/22     albuterol  (PROAIR  HFA) 108 (90 Base) MCG/ACT inhaler Inhale 2 puffs by mouth every 6 hours as needed 04/13/21     azelastine  (ASTELIN ) 0.1 % nasal spray Place into the nose. 11/10/17   [provider]  B Complex-C-E-Zn (B COMPLEX-C-E-ZINC) tablet Take by mouth.    [provider]  buPROPion  (WELLBUTRIN  XL) 300 MG 24 hr tablet Take 1 tablet (300 mg total) by mouth in the morning. 11/10/23     Calcium Carbonate-Vitamin D  (CALCIUM 500 + D) 500-125 MG-UNIT TABS Take 1 tablet by mouth daily.    [provider]  diclofenac  (VOLTAREN ) 75 MG EC tablet as needed. 09/12/15   [provider]  estradiol  (ESTRACE ) 0.5 MG tablet Take 0.5 tablets (0.25 mg total)  by mouth daily. 11/25/23     estradiol  (ESTRACE ) 1 MG tablet TAKE 1 TABLET (1 MG TOTAL) BY MOUTH DAILY. Patient taking differently: Take 1 mg by mouth at bedtime. 10/19/15   Avram Barnie NOVAK, FNP  fluticasone  (FLONASE ) 50 MCG/ACT nasal spray Place 2 sprays into both nostrils every evening. 07/31/23     Glucosamine Sulfate 500 MG CAPS Take by mouth.    [provider]  ibuprofen  (ADVIL ) 800 MG tablet Take 1 tablet (800 mg total) by mouth 2 (two) times daily as needed. 04/05/21   Arvell Lye R, DO  Levomilnacipran  HCl ER (FETZIMA ) 40 MG CP24 Take 1 capsule by mouth every morning. 04/28/23     Multiple Vitamin (MULITIVITAMIN WITH MINERALS) TABS Take 1 tablet by mouth daily.    [provider]  nystatin  ointment (MYCOSTATIN ) Apply to affected area 2 times a day for 7 days 04/17/21     triamcinolone  cream (KENALOG ) 0.5 % Apply 1 application twice a day for 7 days 05/24/21     VITAMIN D  PO Take by mouth.    [provider]      Allergies    Colace  [docusate calcium], Morphine and codeine, Other, Penicillins, Zithromax  [azithromycin ], Dulcolax [bisacodyl], Effexor  [venlafaxine ], Erythromycin, Prednisone , and Tape    Review of Systems   Review of Systems  Physical Exam Updated Vital Signs BP 139/74  Pulse 69   Temp 98.7 F (37.1 C) (Oral)   Resp 16   Ht 5' 6 (1.676 m)   Wt 73 kg   LMP 10/28/2008   SpO2 99%   BMI 25.99 kg/m  Physical Exam Vitals and nursing note reviewed.  Constitutional:      General: She is not in acute distress.    Appearance: She is well-developed.  HENT:     Head: Normocephalic and atraumatic.  Eyes:     Extraocular Movements: Extraocular movements intact.     Conjunctiva/sclera: Conjunctivae normal.     Pupils: Pupils are equal, round, and reactive to light.  Cardiovascular:     Rate and Rhythm: Normal rate and regular rhythm.     Pulses:          Radial pulses are 2+ on the right side and 2+ on the left side.     Heart sounds:  Normal heart sounds. No murmur heard. Pulmonary:     Effort: Pulmonary effort is normal. No respiratory distress.     Breath sounds: Normal breath sounds.  Abdominal:     Palpations: Abdomen is soft.     Tenderness: There is no abdominal tenderness.  Musculoskeletal:        General: No swelling. Normal range of motion.     Cervical back: Normal range of motion and neck supple.     Right lower leg: No edema.     Left lower leg: No edema.  Skin:    General: Skin is warm and dry.     Capillary Refill: Capillary refill takes less than 2 seconds.  Neurological:     Mental Status: She is alert.  Psychiatric:        Mood and Affect: Mood normal.     ED Results / Procedures / Treatments   Labs (all labs ordered are listed, but only abnormal results are displayed) Labs Reviewed  BASIC METABOLIC PANEL  CBC  TROPONIN I (HIGH SENSITIVITY)  TROPONIN I (HIGH SENSITIVITY)    EKG EKG Interpretation Date/Time:  Sunday December 07 2023 11:04:03 EST Ventricular Rate:  82 PR Interval:  110 QRS Duration:  88 QT Interval:  358 QTC Calculation: 418 R Axis:   79  Text Interpretation: Sinus rhythm with short PR Nonspecific ST and T wave abnormality Confirmed by Ruthe Cornet [phone]) on 12/07/2023 11:06:31 AM  Radiology DG Chest 2 View Result Date: 12/07/2023 CLINICAL DATA:  Left-sided chest pain. EXAM: CHEST - 2 VIEW COMPARISON:  Chest x-ray dated November 04, 2022. FINDINGS: The heart size and mediastinal contours are within normal limits. Both lungs are clear. The visualized skeletal structures are unremarkable. IMPRESSION: No active cardiopulmonary disease. Electronically Signed   By: Elsie ONEIDA Shoulder M.D.   On: 12/07/2023 11:59    Procedures Procedures    Medications Ordered in ED Medications  amLODipine  (NORVASC ) tablet 5 mg (5 mg Oral Given 12/07/23 1227)    ED Course/ Medical Decision Making/ A&P                                 Medical Decision Making Amount and/or Complexity of  Data Reviewed Labs: ordered. Radiology: ordered.  Risk Prescription drug management.   Megan Combs is here with concern for her blood pressure, chest discomfort.  Blood pressure 160/104.  It has been elevated the last couple days in the same range.  She had a brief episode of some chest pain  this morning now feeling better but still has a little bit.  She has no PE risk factors.  She has high cholesterol high blood pressure possibly now.  But no major other cardiac risk factors except for some family history.  She does state that she has been anxious about her blood pressure.  Overall EKG shows sinus rhythm.  Some T wave inversions but seen on prior EKGs.  I do not see any obvious new ischemic changes.  She does not have a very concerning story for ACS.  Heart score is 2.  Will get troponin CBC BMP chest x-ray reevaluate.  Have no concern for PE.  Wells criteria 0.  Lab work my review interpretation is unremarkable.  No significant anemia electrolyte abnormality kidney injury or leukocytosis.  Troponin negative x 2.  Overall blood pressure improved.  Will start her on amlodipine .  She will continue outpatient workup with her cardiologist.  Discharged in good condition.  This chart was dictated using voice recognition software.  Despite best efforts to proofread,  errors can occur which can change the documentation meaning.         Final Clinical Impression(s) / ED Diagnoses Final diagnoses:  Atypical chest pain  Secondary hypertension    Rx / DC Orders ED Discharge Orders          Ordered    amLODipine  (NORVASC ) 5 MG tablet  Daily        12/07/23 1356              Ruthe Cornet, DO 12/07/23 1357

## 2023-12-08 ENCOUNTER — Other Ambulatory Visit: Payer: Self-pay

## 2023-12-09 ENCOUNTER — Ambulatory Visit (HOSPITAL_COMMUNITY)
Admission: RE | Admit: 2023-12-09 | Discharge: 2023-12-09 | Disposition: A | Discharge status: Home / Self Care | Payer: Self-pay | Source: Ambulatory Visit | Attending: Cardiology | Admitting: Cardiology

## 2023-12-09 DIAGNOSIS — E7800 Pure hypercholesterolemia, unspecified: Secondary | ICD-10-CM | POA: Insufficient documentation

## 2023-12-09 DIAGNOSIS — Z8249 Family history of ischemic heart disease and other diseases of the circulatory system: Secondary | ICD-10-CM | POA: Insufficient documentation

## 2023-12-13 ENCOUNTER — Inpatient Hospital Stay (HOSPITAL_COMMUNITY): Admission: RE | Admit: 2023-12-13 | Payer: 59 | Source: Ambulatory Visit

## 2023-12-15 ENCOUNTER — Ambulatory Visit: Payer: 59 | Attending: Physician Assistant | Admitting: Physician Assistant

## 2023-12-15 ENCOUNTER — Other Ambulatory Visit (HOSPITAL_COMMUNITY): Payer: Self-pay | Admitting: Psychiatry

## 2023-12-15 ENCOUNTER — Other Ambulatory Visit (HOSPITAL_COMMUNITY): Payer: Self-pay

## 2023-12-15 ENCOUNTER — Encounter: Payer: Self-pay | Admitting: Physician Assistant

## 2023-12-15 VITALS — BP 138/76 | HR 98 | Ht 66.0 in | Wt 173.2 lb

## 2023-12-15 DIAGNOSIS — I10 Essential (primary) hypertension: Secondary | ICD-10-CM

## 2023-12-15 DIAGNOSIS — E7800 Pure hypercholesterolemia, unspecified: Secondary | ICD-10-CM | POA: Diagnosis not present

## 2023-12-15 DIAGNOSIS — Z79899 Other long term (current) drug therapy: Secondary | ICD-10-CM

## 2023-12-15 DIAGNOSIS — R079 Chest pain, unspecified: Secondary | ICD-10-CM

## 2023-12-15 DIAGNOSIS — Z8249 Family history of ischemic heart disease and other diseases of the circulatory system: Secondary | ICD-10-CM

## 2023-12-15 DIAGNOSIS — I493 Ventricular premature depolarization: Secondary | ICD-10-CM | POA: Diagnosis not present

## 2023-12-15 LAB — LIPID PANEL
Chol/HDL Ratio: 2.9 {ratio} (ref 0.0–4.4)
Cholesterol, Total: 214 mg/dL — ABNORMAL HIGH (ref 100–199)
HDL: 73 mg/dL (ref >39)
LDL Chol Calc (NIH): 115 mg/dL — ABNORMAL HIGH (ref 0–99)
Triglycerides: 150 mg/dL — ABNORMAL HIGH (ref 0–149)
VLDL Cholesterol Cal: 26 mg/dL (ref 5–40)

## 2023-12-15 LAB — ALT: ALT: 21 [IU]/L (ref 0–32)

## 2023-12-15 MED ORDER — AMLODIPINE BESYLATE 5 MG PO TABS
5.0000 mg | ORAL_TABLET | Freq: Every day | ORAL | 3 refills | Status: DC
  Filled 2023-12-15 – 2023-12-16 (×2): qty 90, 90d supply, fill #0
  Filled 2024-01-02: qty 30, 30d supply, fill #0
  Filled 2024-02-06: qty 30, 30d supply, fill #1

## 2023-12-15 NOTE — Progress Notes (Signed)
Cardiology Office Note:  .   Date:  12/15/2023  ID:  Megan Combs, DOB [birth year], MRN [id number] PCP: Lorenda Ishihara, MD  [facility] Providers Cardiologist:  Armanda Magic, MD {  History of Present Illness: .   Megan Combs is a 63 y.o. female with a past medical history of premature CAD in her family, HLD and PVCs who was seen remotely by cardiology but lost to follow-up and now referred back to establish care here for follow-up appointment.  Was seen 12/04/2023 and at that time she was doing well.  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  She does have a history of PVCs but no recent palpitations at that visit.  LDL was slightly above goal but Mediterranean diet reviewed and the plan was to recheck her lipids.  No chest pain or shortness of breath at that time.  She did have a calcium score ordered.  Nuclear stress test back in 2017 showed no ischemia.  She then presented 3 days later on 2/9 to the ED with complaint of chest discomfort and elevated blood pressure.  No exertional pain but a little bit of anxiety given high cholesterol and now high blood pressure.  She was feeling better on arrival to the ER.  She denied weakness and numbness, tingling.  Troponins were negative x 2.  Blood pressure was 160/104.  Brief episode of some chest pain that morning but was feeling better.  No PE risk factors.  Some ST wave inversions been seen on prior EKGs.  On 12/09/23 she had a coronary calcium score which was 0.  No coronary disease was noted..  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 She reports that her blood pressure has been fluctuating and has reached high levels, with readings as high as 202/102. She has been monitoring her blood pressure at home and has been taking amlodipine, which has helped to lower her blood pressure. She reports occasional side effects from the  medication, including flushing and dizziness.  The patient has a strong family history of heart disease, with both parents dying from heart-related conditions. This has led to significant anxiety about her own heart health. She reports no symptoms of heart disease, such as chest pain or shortness of breath.  In an effort to manage her blood pressure and overall health, the patient has adopted a plant-based diet and exercises regularly, including yoga and swimming. She reports that these lifestyle changes have helped to lower her blood pressure. She is also seeing a nutritionist and is interested in further measures to improve her health and potentially reduce her need for medication.  Reports no shortness of breath nor dyspnea on exertion. Reports no chest pain, pressure, or tightness. No edema, orthopnea, PND. Reports no palpitations.   Discussed the use of AI scribe software for clinical note transcription with the patient, who gave verbal consent to proceed.  ROS: Pertinent ROS in HPI  Studies Reviewed: .       CT cardiac 12/09/2023  IMPRESSION: 1. Coronary calcium score of 0. This was 1st percentile for age, gender, and race matched controls.  Physical Exam:   VS:  LMP 10/28/2008    Wt Readings from Last 3 Encounters:  12/07/23 161 lb (73 kg)  12/04/23 172 lb 3.2 oz (78.1 kg)  05/23/23 160 lb (72.6 kg)    GEN: Well nourished, well developed in no acute distress NECK: No  JVD; No carotid bruits CARDIAC: RRR, no murmurs, rubs, gallops RESPIRATORY:  Clear to auscultation without rales, wheezing or rhonchi  ABDOMEN: Soft, non-tender, non-distended EXTREMITIES:  No edema; No deformity   ASSESSMENT AND PLAN: .    Hypertension Recent diagnosis with elevated readings, including one episode of systolic blood pressure over 200. Currently on Amlodipine 5mg  with some improvement in readings. Family history of cardiovascular disease. Patient is proactive with lifestyle modifications  including plant-based diet and regular exercise. Mild side effects of medication reported (flushing, occasional dizziness). -Continue Amlodipine 5mg  daily. -Monitor blood pressure daily, an hour after morning medications. -Consider referral to preventative medicine clinic for further management. -Consider echocardiogram to assess for left ventricular hypertrophy. -Check cholesterol levels, results pending from recent test. -Ensure adequate refills for Amlodipine.  Hyperlipidemia Elevated LDL cholesterol in January. Recent lifestyle modifications including plant-based diet. Pending recent cholesterol test results. -Monitor cholesterol levels, results pending from recent test. -Continue plant-based diet and regular exercise. -Consider referral to preventative medicine clinic for further management.  Weight Management BMI of 26, patient is proactive with lifestyle modifications including plant-based diet and regular exercise. -Continue current lifestyle modifications. -Consider referral to nutritionist for further dietary advice.        Dispo: She can follow-up in a few months with the lipid clinic  Signed, Sharlene Dory, PA-C

## 2023-12-15 NOTE — Patient Instructions (Addendum)
Medication Instructions:  Your physician recommends that you continue on your current medications as directed. Please refer to the Current Medication list given to you today.  *If you need a refill on your cardiac medications before your next appointment, please call your pharmacy*  Follow-Up: At [facility], you and your health needs are our priority.  As part of our continuing mission to provide you with exceptional heart care, we have created designated Provider Care Teams.  These Care Teams include your primary Cardiologist (physician) and Advanced Practice Providers (APPs -  Physician Assistants and Nurse Practitioners) who all work together to provide you with the care you need, when you need it.  Your next appointment:   2-3 months  Provider:   Eligha Bridegroom, NP in the lipid clinic  Then, Armanda Magic, MD will plan to see you again in 6 month(s).

## 2023-12-16 ENCOUNTER — Encounter: Payer: Self-pay | Admitting: Physician Assistant

## 2023-12-16 ENCOUNTER — Other Ambulatory Visit: Payer: Self-pay

## 2023-12-16 ENCOUNTER — Other Ambulatory Visit (HOSPITAL_COMMUNITY): Payer: Self-pay

## 2023-12-16 DIAGNOSIS — Z79899 Other long term (current) drug therapy: Secondary | ICD-10-CM

## 2023-12-16 DIAGNOSIS — E7800 Pure hypercholesterolemia, unspecified: Secondary | ICD-10-CM

## 2023-12-16 MED ORDER — FETZIMA 40 MG PO CP24
1.0000 | ORAL_CAPSULE | Freq: Every morning | ORAL | 0 refills | Status: AC
Start: 2023-12-16 — End: ?
  Filled 2023-12-16: qty 30, 30d supply, fill #0
  Filled 2024-01-20: qty 30, 30d supply, fill #1
  Filled 2024-02-21: qty 30, 30d supply, fill #2

## 2023-12-17 ENCOUNTER — Other Ambulatory Visit (HOSPITAL_COMMUNITY): Payer: Self-pay

## 2023-12-18 ENCOUNTER — Other Ambulatory Visit: Payer: Self-pay

## 2023-12-22 ENCOUNTER — Other Ambulatory Visit (HOSPITAL_COMMUNITY): Payer: Self-pay

## 2023-12-22 MED ORDER — PROGESTERONE MICRONIZED 100 MG PO CAPS
100.0000 mg | ORAL_CAPSULE | ORAL | 5 refills | Status: AC
  Filled 2023-12-22: qty 5, 10d supply, fill #0
  Filled 2023-12-22: qty 10, 20d supply, fill #0
  Filled 2024-01-12 – 2024-01-14 (×2): qty 15, 30d supply, fill #1
  Filled 2024-02-21: qty 15, 30d supply, fill #2
  Filled 2024-03-19: qty 15, 30d supply, fill #3
  Filled 2024-04-26: qty 15, 30d supply, fill #4
  Filled 2024-05-28: qty 15, 30d supply, fill #5
  Filled 2024-06-24: qty 15, 30d supply, fill #6
  Filled 2024-07-28: qty 15, 30d supply, fill #7

## 2023-12-30 NOTE — Progress Notes (Addendum)
 Patient attended the screening event on 12/05/2023. At the screening event pt BP was 164/90, Hemoglobin A1C 5.6. At screening event pt's total cholesterol is 164, HDL 67, Triglycerides 50, LDL . At the event the patient documented her PCP is Lorenda Ishihara, MD. Pt documented she has private insurance, pt documented no tobacco use, pt did not live with someone who smokes and no SDOH needs were documented at the event.  At the event pt was advised by clinician to monitor BP and f/u with PCP.    Chart review confirms pt PCP is Lorenda Ishihara, MD at [facility] [place]. Pt insurance coverage is with [employer]. Post event initial f/u note, Pt last pcp office visit was on 07/21/2023. 12/15/2023 note was by Jari Favre, PA whom pt was referred to see for f/u after pt went to the ED on 12/07/2023 for BP 160/104 and some chest discomfort on the weekend of the screening event 12/05/2023. At the ED pt was started on Amlodipine medication for Hypertension and at discharge from ED 12/07/2023 pt BP was 139/74.   At the pt office visit on 12/15/2023 problems addressed by Sharlene Dory, PA were Hypertension, Hyperlipidemia. At the visit on 12/15/2023 pt BP was 138/76. Cardiovascular and Hypertension is on pt problem list that's being addressed by PCP and care team. Chart review also indicates future appts for pt with Levi Aland, NP 02/18/2024 to f/u on LDL levels. No additional Health equity team support indicated at this time.

## 2023-12-31 ENCOUNTER — Other Ambulatory Visit: Payer: Self-pay

## 2023-12-31 ENCOUNTER — Ambulatory Visit: Admitting: Family Medicine

## 2023-12-31 ENCOUNTER — Encounter: Payer: Self-pay | Admitting: Family Medicine

## 2023-12-31 VITALS — BP 134/78 | Ht 66.0 in | Wt 167.0 lb

## 2023-12-31 DIAGNOSIS — M25561 Pain in right knee: Secondary | ICD-10-CM

## 2023-12-31 NOTE — Patient Instructions (Signed)
 Your exam is reassuring. This is consistent with a flare of arthritis with swelling in your knee. Compression when up and walking around. Icing 15 minutes at a time 3-4 times a day. Aleve 2 tabs twice a day with food for 7 days then as needed. Quad strengthening. Avoid deep squats, pivoting motions. If this isn't improving over the next 1.5 weeks follow back up with [place].

## 2023-12-31 NOTE — Progress Notes (Signed)
 PCP: Lorenda Ishihara, MD  Subjective:   HPI: Patient is a 63 y.o. female here for right knee pain.  Patient reports she started to get pain and swelling in her right knee about 4 days ago. No acute injury or trauma before this. Noted this early in the day. Has been doing training to teach yoga but nothing abnormal with this or injuries when she was doing this. Using sleeve, took ibuprofen today. Swells more after activity. History of patellofemoral issues. Feels like it's going to lock up on her and is unstable.  Past Medical History:  Diagnosis Date   Allergy    Anemia    Anxiety    Arthritis    right knee   Degenerative disc disease    Depression    Family history of early CAD    Fibromyalgia    Hyperlipidemia    IBS (irritable bowel syndrome)    Lumbar radiculitis    Plantar fasciitis of left foot    PVC (premature ventricular contraction)    Salmonella poisoning     Current Outpatient Medications on File Prior to Visit  Medication Sig Dispense Refill   acetic acid-hydrocortisone (VOSOL-HC) OTIC solution Place 4 drops into both ears nightly for 2 weeks, then use as needed for itching. 10 mL 2   albuterol (PROAIR HFA) 108 (90 Base) MCG/ACT inhaler Inhale 2 puffs by mouth every 6 hours as needed 18 g 0   amLODipine (NORVASC) 5 MG tablet Take 1 tablet (5 mg total) by mouth daily. 90 tablet 3   azelastine (ASTELIN) 0.1 % nasal spray Place into the nose.     B Complex-C-E-Zn (B COMPLEX-C-E-ZINC) tablet Take by mouth.     buPROPion (WELLBUTRIN XL) 300 MG 24 hr tablet Take 1 tablet (300 mg total) by mouth in the morning. 90 tablet 1   Calcium Carbonate-Vitamin D (CALCIUM 500 + D) 500-125 MG-UNIT TABS Take 1 tablet by mouth daily.     diclofenac (VOLTAREN) 75 MG EC tablet as needed.     estradiol (ESTRACE) 0.5 MG tablet Take 0.5 tablets (0.25 mg total) by mouth daily. 90 tablet 4   estradiol (ESTRACE) 1 MG tablet TAKE 1 TABLET (1 MG TOTAL) BY MOUTH DAILY. (Patient  taking differently: Take 1 mg by mouth at bedtime.) 90 tablet 0   fluticasone (FLONASE) 50 MCG/ACT nasal spray Place 2 sprays into both nostrils every evening. 16 g 5   Glucosamine Sulfate 500 MG CAPS Take by mouth.     ibuprofen (ADVIL) 800 MG tablet Take 1 tablet (800 mg total) by mouth 2 (two) times daily as needed. 40 tablet 0   Levomilnacipran HCl ER (FETZIMA) 40 MG CP24 Take 1 capsule by mouth every morning. 90 capsule 2   Levomilnacipran HCl ER (FETZIMA) 40 MG CP24 Take 1 capsule by mouth every morning. 90 capsule 0   Multiple Vitamin (MULITIVITAMIN WITH MINERALS) TABS Take 1 tablet by mouth daily.     nystatin ointment (MYCOSTATIN) Apply to affected area 2 times a day for 7 days 15 g 0   progesterone (PROMETRIUM) 100 MG capsule Take 1 capsule (100 mg total) by mouth at bedtime every other night 45 capsule 5   triamcinolone cream (KENALOG) 0.5 % Apply 1 application twice a day for 7 days 15 g 0   No current facility-administered medications on file prior to visit.    Past Surgical History:  Procedure Laterality Date   COLONOSCOPY  last 08/16/2014   EYE SURGERY Right 05/2019  Cataract   KNEE ARTHROSCOPY     KNEE ARTHROSCOPY     laproscopy     POLYPECTOMY     TONSILLECTOMY      Allergies  Allergen Reactions   Colace  [Docusate Calcium] Other (See Comments)    cramping   Morphine And Codeine     Other reaction(s): Redness IV   Other Rash    Medical tape, band aids   Penicillins Hives and Rash    Did it involve swelling of the face/tongue/throat, SOB, or low BP? No Did it involve sudden or severe rash/hives, skin peeling, or any reaction on the inside of your mouth or nose? No Did you need to seek medical attention at a hospital or doctor's office? No When did it last happen?      4 years ago If all above answers are "NO", may proceed with cephalosporin use.   Zithromax [Azithromycin] Rash and Hives   Dulcolax [Bisacodyl]     Dehydration    Effexor [Venlafaxine]      Intolerant SSRI/SNRI   Erythromycin Nausea And Vomiting   Prednisone Other (See Comments)    Paranoid , anxious, hyper emotionally, could not sleep, odd dreams Causes severe anxiety and insomnia.   Tape Dermatitis    BP 134/78   Ht 5\' 6"  (1.676 m)   Wt 167 lb (75.8 kg)   LMP 10/28/2008   BMI 26.95 kg/m      02/06/2022    3:23 PM  [facility]  Frequency of aerobic exercise (# of days/week) 2  Average time in minutes 15  Frequency of strengthening activities (# of days/week) 1        No data to display              Objective:  Physical Exam:  Gen: NAD, comfortable in exam room  Right knee: Mod effusion.  No other gross deformity, ecchymoses. TTP lateral joint line. ROM 0 - 120 degrees.  Normal strength Negative ant/post drawers. Negative valgus/varus testing. Negative lachman.  Negative mcmurrays, apleys, thessalys. NV intact distally.  Limited MSK u/s right knee:  Mild effusion noted.  Quad and patellar tendons normal.  No visible medial or lateral meniscus tears.  Some hyperechoic signal within joint space.   Assessment & Plan:  1. Right knee pain with effusion - exam is reassuring despite her effusion.  She does have some mild arthropathy and may have had a flare of this.  Advised aleve x 7 days, icing, compression, elevation.  Quad strengthening.  Follow up in 1.5 weeks if not improving as expected.

## 2024-01-02 ENCOUNTER — Other Ambulatory Visit (HOSPITAL_COMMUNITY): Payer: Self-pay

## 2024-01-07 ENCOUNTER — Other Ambulatory Visit (HOSPITAL_COMMUNITY): Payer: Self-pay

## 2024-01-08 ENCOUNTER — Other Ambulatory Visit (HOSPITAL_COMMUNITY): Payer: Self-pay

## 2024-01-10 ENCOUNTER — Other Ambulatory Visit (HOSPITAL_COMMUNITY): Payer: Self-pay

## 2024-01-10 MED ORDER — AZELASTINE-FLUTICASONE 137-50 MCG/ACT NA SUSP
1.0000 | Freq: Two times a day (BID) | NASAL | 0 refills | Status: AC
  Filled 2024-01-10: qty 23, 30d supply, fill #0

## 2024-01-12 ENCOUNTER — Other Ambulatory Visit (HOSPITAL_COMMUNITY): Payer: Self-pay

## 2024-01-12 ENCOUNTER — Encounter (HOSPITAL_COMMUNITY): Payer: Self-pay | Admitting: Pharmacist

## 2024-01-12 MED ORDER — ALBUTEROL SULFATE HFA 108 (90 BASE) MCG/ACT IN AERS
2.0000 | INHALATION_SPRAY | RESPIRATORY_TRACT | 3 refills | Status: AC
  Filled 2024-01-12: qty 6.7, 20d supply, fill #0
  Filled 2024-09-23: qty 6.7, 20d supply, fill #1

## 2024-01-12 MED ORDER — AZITHROMYCIN 500 MG PO TABS
500.0000 mg | ORAL_TABLET | Freq: Every day | ORAL | 0 refills | Status: DC
  Filled 2024-01-12: qty 10, 10d supply, fill #0

## 2024-01-12 MED ORDER — PREDNISONE 10 MG PO TABS
10.0000 mg | ORAL_TABLET | Freq: Every day | ORAL | 0 refills | Status: DC
  Filled 2024-01-12: qty 4, 4d supply, fill #0

## 2024-01-21 ENCOUNTER — Other Ambulatory Visit (HOSPITAL_COMMUNITY): Payer: Self-pay

## 2024-01-21 ENCOUNTER — Other Ambulatory Visit: Payer: Self-pay

## 2024-01-22 ENCOUNTER — Other Ambulatory Visit (HOSPITAL_BASED_OUTPATIENT_CLINIC_OR_DEPARTMENT_OTHER): Payer: 59

## 2024-01-22 ENCOUNTER — Other Ambulatory Visit: Payer: Self-pay

## 2024-01-26 ENCOUNTER — Encounter: Payer: Self-pay | Admitting: Family Medicine

## 2024-01-26 ENCOUNTER — Ambulatory Visit: Admitting: Family Medicine

## 2024-01-26 VITALS — BP 142/82 | Ht 66.0 in | Wt 166.0 lb

## 2024-01-26 DIAGNOSIS — M25561 Pain in right knee: Secondary | ICD-10-CM

## 2024-01-26 NOTE — Progress Notes (Signed)
 PCP: Lorenda Ishihara, MD  Subjective:   HPI: Patient is a 63 y.o. female here for right knee pain.  3/5: Patient reports she started to get pain and swelling in her right knee about 4 days ago. No acute injury or trauma before this. Noted this early in the day. Has been doing training to teach yoga but nothing abnormal with this or injuries when she was doing this. Using sleeve, took ibuprofen today. Swells more after activity. History of patellofemoral issues. Feels like it's going to lock up on her and is unstable.  3/31: Patient reports her right knee still feels full and unstable. Gets popping with flexion/extension of her right knee. Wearing brace. She has yoga testing this coming weekend.  Past Medical History:  Diagnosis Date   Allergy    Anemia    Anxiety    Arthritis    right knee   Degenerative disc disease    Depression    Family history of early CAD    Fibromyalgia    Hyperlipidemia    IBS (irritable bowel syndrome)    Lumbar radiculitis    Plantar fasciitis of left foot    PVC (premature ventricular contraction)    Salmonella poisoning     Current Outpatient Medications on File Prior to Visit  Medication Sig Dispense Refill   acetic acid-hydrocortisone (VOSOL-HC) OTIC solution Place 4 drops into both ears nightly for 2 weeks, then use as needed for itching. 10 mL 2   albuterol (PROAIR HFA) 108 (90 Base) MCG/ACT inhaler Inhale 2 puffs by mouth every 6 hours as needed 18 g 0   albuterol (VENTOLIN HFA) 108 (90 Base) MCG/ACT inhaler Use 2 puffs as needed for wheezing every 4 - 6 hours 6.7 g 3   amLODipine (NORVASC) 5 MG tablet Take 1 tablet (5 mg total) by mouth daily. 90 tablet 3   azelastine (ASTELIN) 0.1 % nasal spray Place into the nose.     Azelastine-Fluticasone 137-50 MCG/ACT SUSP Place 1 spray into the nose 2 (two) times daily. 23 g 0   azithromycin (ZITHROMAX) 500 MG tablet Take 1 tablet (500 mg total) by mouth daily for 10 days 10 tablet 0    B Complex-C-E-Zn (B COMPLEX-C-E-ZINC) tablet Take by mouth.     buPROPion (WELLBUTRIN XL) 300 MG 24 hr tablet Take 1 tablet (300 mg total) by mouth in the morning. 90 tablet 1   Calcium Carbonate-Vitamin D (CALCIUM 500 + D) 500-125 MG-UNIT TABS Take 1 tablet by mouth daily.     diclofenac (VOLTAREN) 75 MG EC tablet as needed.     estradiol (ESTRACE) 0.5 MG tablet Take 0.5 tablets (0.25 mg total) by mouth daily. 90 tablet 4   estradiol (ESTRACE) 1 MG tablet TAKE 1 TABLET (1 MG TOTAL) BY MOUTH DAILY. (Patient taking differently: Take 1 mg by mouth at bedtime.) 90 tablet 0   fluticasone (FLONASE) 50 MCG/ACT nasal spray Place 2 sprays into both nostrils every evening. 16 g 5   Glucosamine Sulfate 500 MG CAPS Take by mouth.     ibuprofen (ADVIL) 800 MG tablet Take 1 tablet (800 mg total) by mouth 2 (two) times daily as needed. 40 tablet 0   Levomilnacipran HCl ER (FETZIMA) 40 MG CP24 Take 1 capsule by mouth every morning. 90 capsule 2   Levomilnacipran HCl ER (FETZIMA) 40 MG CP24 Take 1 capsule by mouth every morning. 90 capsule 0   Multiple Vitamin (MULITIVITAMIN WITH MINERALS) TABS Take 1 tablet by mouth daily.  nystatin ointment (MYCOSTATIN) Apply to affected area 2 times a day for 7 days 15 g 0   predniSONE (DELTASONE) 10 MG tablet Take 1 tablet by mouth with food or milk once a day for 4 days 4 tablet 0   progesterone (PROMETRIUM) 100 MG capsule Take 1 capsule (100 mg total) by mouth at bedtime every other night 45 capsule 5   triamcinolone cream (KENALOG) 0.5 % Apply 1 application twice a day for 7 days 15 g 0   No current facility-administered medications on file prior to visit.    Past Surgical History:  Procedure Laterality Date   COLONOSCOPY  last 08/16/2014   EYE SURGERY Right 05/2019   Cataract   KNEE ARTHROSCOPY     KNEE ARTHROSCOPY     laproscopy     POLYPECTOMY     TONSILLECTOMY      Allergies  Allergen Reactions   Colace  [Docusate Calcium] Other (See Comments)     cramping   Morphine And Codeine     Other reaction(s): Redness IV   Other Rash    Medical tape, band aids   Penicillins Hives and Rash    Did it involve swelling of the face/tongue/throat, SOB, or low BP? No Did it involve sudden or severe rash/hives, skin peeling, or any reaction on the inside of your mouth or nose? No Did you need to seek medical attention at a hospital or doctor's office? No When did it last happen?      4 years ago If all above answers are "NO", may proceed with cephalosporin use.   Zithromax [Azithromycin] Rash and Hives   Dulcolax [Bisacodyl]     Dehydration    Effexor [Venlafaxine]     Intolerant SSRI/SNRI   Erythromycin Nausea And Vomiting   Prednisone Other (See Comments)    Paranoid , anxious, hyper emotionally, could not sleep, odd dreams Causes severe anxiety and insomnia.   Tape Dermatitis    BP (!) 142/82   Ht 5\' 6"  (1.676 m)   Wt 166 lb (75.3 kg)   LMP 10/28/2008   BMI 26.79 kg/m      02/06/2022    3:23 PM  [facility]  Frequency of aerobic exercise (# of days/week) 2  Average time in minutes 15  Frequency of strengthening activities (# of days/week) 1        No data to display              Objective:  Physical Exam:  Gen: NAD, comfortable in exam room  Right knee: Mild effusion.  No other gross deformity, ecchymoses. Mild TTP posterior patellar facets.  No other TTP. ROM 0 - 120 degrees.  Normal strength. Negative ant/post drawers. Negative valgus/varus testing. Negative lachman.  Negative mcmurrays, apleys.  NV intact distally.  Assessment & Plan:  1. Right knee pain with mild effusion - again with reassuring exam.  Patellofemoral tenderness consistent with mild arthropathy.  Discussed consideration of injection as next step - she will continue with conservative treatment at this time.  Quad strengthening, nsaids, icing, brace, elevation.  Consider injection next week or in future.

## 2024-02-02 ENCOUNTER — Ambulatory Visit: Admitting: Family Medicine

## 2024-02-02 VITALS — BP 126/76 | Ht 66.0 in | Wt 166.0 lb

## 2024-02-02 DIAGNOSIS — M25561 Pain in right knee: Secondary | ICD-10-CM | POA: Diagnosis not present

## 2024-02-03 ENCOUNTER — Encounter: Payer: Self-pay | Admitting: Family Medicine

## 2024-02-03 NOTE — Progress Notes (Signed)
 PCP: Lorenda Ishihara, MD  Subjective:   HPI: Patient is a 63 y.o. female here for right knee pain.  3/5: Patient reports she started to get pain and swelling in her right knee about 4 days ago. No acute injury or trauma before this. Noted this early in the day. Has been doing training to teach yoga but nothing abnormal with this or injuries when she was doing this. Using sleeve, took ibuprofen today. Swells more after activity. History of patellofemoral issues. Feels like it's going to lock up on her and is unstable.  3/31: Patient reports her right knee still feels full and unstable. Gets popping with flexion/extension of her right knee. Wearing brace. She has yoga testing this coming weekend.  4/7: Patient reports overall she's doing well. Swelling has improved. She is continuing with yoga. Would like to go ahead with physical therapy.  Past Medical History:  Diagnosis Date   Allergy    Anemia    Anxiety    Arthritis    right knee   Degenerative disc disease    Depression    Family history of early CAD    Fibromyalgia    Hyperlipidemia    IBS (irritable bowel syndrome)    Lumbar radiculitis    Plantar fasciitis of left foot    PVC (premature ventricular contraction)    Salmonella poisoning     Current Outpatient Medications on File Prior to Visit  Medication Sig Dispense Refill   acetic acid-hydrocortisone (VOSOL-HC) OTIC solution Place 4 drops into both ears nightly for 2 weeks, then use as needed for itching. 10 mL 2   albuterol (PROAIR HFA) 108 (90 Base) MCG/ACT inhaler Inhale 2 puffs by mouth every 6 hours as needed 18 g 0   albuterol (VENTOLIN HFA) 108 (90 Base) MCG/ACT inhaler Use 2 puffs as needed for wheezing every 4 - 6 hours 6.7 g 3   amLODipine (NORVASC) 5 MG tablet Take 1 tablet (5 mg total) by mouth daily. 90 tablet 3   azelastine (ASTELIN) 0.1 % nasal spray Place into the nose.     Azelastine-Fluticasone 137-50 MCG/ACT SUSP Place 1 spray  into the nose 2 (two) times daily. 23 g 0   azithromycin (ZITHROMAX) 500 MG tablet Take 1 tablet (500 mg total) by mouth daily for 10 days 10 tablet 0   B Complex-C-E-Zn (B COMPLEX-C-E-ZINC) tablet Take by mouth.     buPROPion (WELLBUTRIN XL) 300 MG 24 hr tablet Take 1 tablet (300 mg total) by mouth in the morning. 90 tablet 1   Calcium Carbonate-Vitamin D (CALCIUM 500 + D) 500-125 MG-UNIT TABS Take 1 tablet by mouth daily.     diclofenac (VOLTAREN) 75 MG EC tablet as needed.     estradiol (ESTRACE) 0.5 MG tablet Take 0.5 tablets (0.25 mg total) by mouth daily. 90 tablet 4   estradiol (ESTRACE) 1 MG tablet TAKE 1 TABLET (1 MG TOTAL) BY MOUTH DAILY. (Patient taking differently: Take 1 mg by mouth at bedtime.) 90 tablet 0   fluticasone (FLONASE) 50 MCG/ACT nasal spray Place 2 sprays into both nostrils every evening. 16 g 5   Glucosamine Sulfate 500 MG CAPS Take by mouth.     ibuprofen (ADVIL) 800 MG tablet Take 1 tablet (800 mg total) by mouth 2 (two) times daily as needed. 40 tablet 0   Levomilnacipran HCl ER (FETZIMA) 40 MG CP24 Take 1 capsule by mouth every morning. 90 capsule 2   Levomilnacipran HCl ER (FETZIMA) 40 MG CP24 Take 1  capsule by mouth every morning. 90 capsule 0   Multiple Vitamin (MULITIVITAMIN WITH MINERALS) TABS Take 1 tablet by mouth daily.     nystatin ointment (MYCOSTATIN) Apply to affected area 2 times a day for 7 days 15 g 0   predniSONE (DELTASONE) 10 MG tablet Take 1 tablet by mouth with food or milk once a day for 4 days 4 tablet 0   progesterone (PROMETRIUM) 100 MG capsule Take 1 capsule (100 mg total) by mouth at bedtime every other night 45 capsule 5   triamcinolone cream (KENALOG) 0.5 % Apply 1 application twice a day for 7 days 15 g 0   No current facility-administered medications on file prior to visit.    Past Surgical History:  Procedure Laterality Date   COLONOSCOPY  last 08/16/2014   EYE SURGERY Right 05/2019   Cataract   KNEE ARTHROSCOPY     KNEE  ARTHROSCOPY     laproscopy     POLYPECTOMY     TONSILLECTOMY      Allergies  Allergen Reactions   Colace  [Docusate Calcium] Other (See Comments)    cramping   Morphine And Codeine     Other reaction(s): Redness IV   Other Rash    Medical tape, band aids   Penicillins Hives and Rash    Did it involve swelling of the face/tongue/throat, SOB, or low BP? No Did it involve sudden or severe rash/hives, skin peeling, or any reaction on the inside of your mouth or nose? No Did you need to seek medical attention at a hospital or doctor's office? No When did it last happen?      4 years ago If all above answers are "NO", may proceed with cephalosporin use.   Zithromax [Azithromycin] Rash and Hives   Dulcolax [Bisacodyl]     Dehydration    Effexor [Venlafaxine]     Intolerant SSRI/SNRI   Erythromycin Nausea And Vomiting   Prednisone Other (See Comments)    Paranoid , anxious, hyper emotionally, could not sleep, odd dreams Causes severe anxiety and insomnia.   Tape Dermatitis    BP 126/76   Ht 5\' 6"  (1.676 m)   Wt 166 lb (75.3 kg)   LMP 10/28/2008   BMI 26.79 kg/m      02/06/2022    3:23 PM  [facility] Exercise  Frequency of aerobic exercise (# of days/week) 2  Average time in minutes 15  Frequency of strengthening activities (# of days/week) 1        No data to display              Objective:  Physical Exam:  Gen: NAD, comfortable in exam room  Right knee: No gross deformity, ecchymoses, swelling. No TTP. FROM with normal strength. Negative ant/post drawers. Negative valgus/varus testing. Negative lachman.  Negative mcmurrays, apleys.  NV intact distally.  Assessment & Plan:  1. Right knee pain - improved and swelling has gone down.  Due to mild arthropathy of patellofemoral compartment.  She will start physical therapy, continue home exercises.  Nsaids, icing, brace, elevation.  Follow up as needed.

## 2024-02-04 ENCOUNTER — Other Ambulatory Visit (HOSPITAL_COMMUNITY): Payer: Self-pay

## 2024-02-04 DIAGNOSIS — R0602 Shortness of breath: Secondary | ICD-10-CM

## 2024-02-04 MED ORDER — PREDNISONE 10 MG (21) PO TBPK
ORAL_TABLET | ORAL | 0 refills | Status: DC
Start: 2024-02-04 — End: 2024-02-18
  Filled 2024-02-04: qty 21, 6d supply, fill #0

## 2024-02-07 ENCOUNTER — Other Ambulatory Visit (HOSPITAL_COMMUNITY): Payer: Self-pay

## 2024-02-09 ENCOUNTER — Ambulatory Visit (HOSPITAL_COMMUNITY)
Admission: RE | Admit: 2024-02-09 | Discharge: 2024-02-09 | Disposition: A | Discharge status: Home / Self Care | Source: Ambulatory Visit | Attending: Physician Assistant | Admitting: Physician Assistant

## 2024-02-09 DIAGNOSIS — R0602 Shortness of breath: Secondary | ICD-10-CM | POA: Diagnosis present

## 2024-02-09 LAB — PULMONARY FUNCTION TEST
DL/VA % pred: 87 %
DL/VA: 3.64 ml/min/mmHg/L
DLCO unc % pred: 93 %
DLCO unc: 20 ml/min/mmHg
FEF 25-75 Post: 2.5 L/s
FEF 25-75 Pre: 2.37 L/s
FEF2575-%Change-Post: 5 %
FEF2575-%Pred-Post: 105 %
FEF2575-%Pred-Pre: 99 %
FEV1-%Change-Post: 2 %
FEV1-%Pred-Post: 111 %
FEV1-%Pred-Pre: 109 %
FEV1-Post: 3.01 L
FEV1-Pre: 2.95 L
FEV1FVC-%Change-Post: 5 %
FEV1FVC-%Pred-Pre: 96 %
FEV6-%Change-Post: -1 %
FEV6-%Pred-Post: 112 %
FEV6-%Pred-Pre: 114 %
FEV6-Post: 3.79 L
FEV6-Pre: 3.87 L
FEV6FVC-%Change-Post: 0 %
FEV6FVC-%Pred-Post: 103 %
FEV6FVC-%Pred-Pre: 103 %
FVC-%Change-Post: -3 %
FVC-%Pred-Post: 108 %
FVC-%Pred-Pre: 112 %
FVC-Post: 3.79 L
FVC-Pre: 3.92 L
Post FEV1/FVC ratio: 79 %
Post FEV6/FVC ratio: 100 %
Pre FEV1/FVC ratio: 75 %
Pre FEV6/FVC Ratio: 100 %
RV % pred: 118 %
RV: 2.52 L
TLC % pred: 119 %
TLC: 6.38 L

## 2024-02-09 MED ORDER — ALBUTEROL SULFATE (2.5 MG/3ML) 0.083% IN NEBU
2.5000 mg | INHALATION_SOLUTION | Freq: Once | RESPIRATORY_TRACT | Status: AC
  Administered 2024-02-09: 2.5 mg via RESPIRATORY_TRACT

## 2024-02-16 ENCOUNTER — Other Ambulatory Visit (HOSPITAL_COMMUNITY): Payer: Self-pay

## 2024-02-16 MED ORDER — AMLODIPINE BESYLATE 10 MG PO TABS
10.0000 mg | ORAL_TABLET | Freq: Every day | ORAL | 1 refills | Status: AC
  Filled 2024-02-16: qty 30, 30d supply, fill #0
  Filled 2024-03-19: qty 30, 30d supply, fill #1
  Filled 2024-04-26: qty 30, 30d supply, fill #2
  Filled 2024-05-28: qty 30, 30d supply, fill #3

## 2024-02-16 NOTE — Progress Notes (Unsigned)
 Cardiology Office Note:  .   Date:  02/19/2024  ID:  Megan Combs, DOB June 21, 1961, MRN [id number] PCP: Megan Lathe, MD  [facility] Providers Cardiologist:  Megan Keas, MD    Patient Profile: .      PMH Family history premature CAD Hyperlipidemia PVCs Low risk nuclear stress test 2017 Coronary calcium score 0  Seen remotely by cardiology for PVCs, she was referred back and seen by Dr. Micael Combs on 12/04/2023 to reestablish cardiac care.  Previous heart monitor showed PVCs.  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  Cholesterol has been increasing and PCP is concerned that she needs to start lipid therapy.  She wanted to work on diet and exercise first and saw a nutritionist who advised plant-based diet which she is following.  At follow-up, her lipid levels were higher than previously.  She still wanted to avoid statin medication and was following Mediterranean diet.  She denied recent palpitations.  Lipid panel completed 08/04/2023 showed LDL 129, HDL 79, and ALT 14.  She was advised to undergo CT calcium score 12/09/23 for further risk stratification. Coronary calcium score was 0.  She presented to ED on 2/9 with chest pain and elevated blood pressure.  BP was 160/104 and she described a brief episode of chest pain that morning but was better upon arrival to ED. EKG with nonspecific ST abnormalities, also seen on previous EKGs.  There was no concern for PE.  Troponins were negative x 2.  She was advised to start amlodipine  and follow-up with cardiology.  Seen in clinic on 12/15/2023 by Megan Rud, PA.  She reported some dizziness and flushing since starting amlodipine . Home systolic BP > 200 mmHg on 1 occasion. Repeat lipid panel 12/15/2023 revealed total cholesterol 214, triglycerides 150, HDL 73, and LDL-C 115.       History of Present Illness: .   Megan Combs History of Present Illness Megan Combs is a very pleasant 64 y.o. female who is here  today to discuss prevention of ASCVD Since being diagnosed with hypertension and hyperlipidemia, she has been very concerned and reports feeling "grief over the loss of health." She has been trying to manage her conditions through diet and exercise, but has recently been prescribed amlodipine , which was recently increased due to persistently elevated BP readings. She has a family history of hypertension, high cholesterol, and diabetes, which she believes may have contributed to her current health issues. Her mother unfortunately passed away suddenly following an MI and subsequent damage to her mitral valve.  She had not previously been diagnosed with heart disease.  Patient had COVID-19 twice, which she believes may have affected her health. She is struggling with accepting the need for medication and is trying to manage her conditions through diet, exercise, and weight loss. She has been feeling tired and has gained weight, which she attributes to a lack of physical activity due to arthritis in her knee.  She graduated from yoga teacher training recently and feels that yoga gives her a great balance.  She has recently seen a nutritionist with [facility] medicine.  She is following a diet that was successful in helping her lose 20 lbs during COVID through a program at [facility].  She underwent genetic testing and was told there was no familial hyperlipidemia.  She works as a Tax adviser. She denies palpitations, chest pain, DOE, orthopnea, edema, PND, presyncope and syncope.   Diet: 6 oz almond milk 1/4 c oats fruit  and protein powder 1/4 c almonds Brown rice with a protein and fruit  Limits bread 1/4 c almonds Balanced dinner - 3-4 oz protein, vegetables   Discussed the use of AI scribe software for clinical note transcription with the patient, who gave verbal consent to proceed.   ROS: See HPI       Studies Reviewed: Megan Combs        No results found for: "LIPOA"   Risk Assessment/Calculations:      HYPERTENSION CONTROL Vitals:   02/18/24 1518 02/18/24 1615  BP: (!) 126/90 (!) 130/90    The patient's blood pressure is elevated above target today.  In order to address the patient's elevated BP: Blood pressure will be monitored at home to determine if medication changes need to be made.          Physical Exam:   VS:  BP (!) 130/90   Pulse 88   Ht 5\' 6"  (1.676 m)   Wt 170 lb 9.6 oz (77.4 kg)   LMP 10/28/2008   SpO2 99%   BMI 27.54 kg/m    Wt Readings from Last 3 Encounters:  02/18/24 170 lb 9.6 oz (77.4 kg)  02/02/24 166 lb (75.3 kg)  01/26/24 166 lb (75.3 kg)    GEN: Well nourished, well developed in no acute distress NECK: No JVD; No carotid bruits CARDIAC: RRR, no murmurs, rubs, gallops RESPIRATORY:  Clear to auscultation without rales, wheezing or rhonchi  ABDOMEN: Soft, non-tender, non-distended EXTREMITIES:  No edema; No deformity     ASSESSMENT AND PLAN: .    Assessment & Plan Hypertension   Recently diagnosed with hypertension with BP of 200/102 mmHg and she was started on amlodipine . PCP recently increased amlodipine  to 10 mg due to persistently elevated BP. SBP of 120 in the office today is best she has seen. Discussed medication benefits and lifestyle changes, emphasizing the protective benefits of medication due to her family history of hypertension and cardiovascular events. Continue amlodipine  10 mg daily and monitor blood pressure at home regularly. Encourage lifestyle modifications, including increased physical activity and low sodium, mostly plant based diet. Plan an echocardiogram to assess cardiac function and monitor for structural changes.  Heart murmur   A heart murmur was detected during examination, with no symptoms of significant valvular disease. An echocardiogram is planned to evaluate the murmur and assess cardiac function. Family history of mitral valve disease in her mother.   Hyperlipidemia  LDL goal < 100 Coronary calcium score of   11/2023.  Lipid panel completed 12/15/2023 revealed total cholesterol 214, triglycerides 150, HDL 73, LDL-C 115.  She is not on statin therapy.  We discussed goal is under 100 mg/dL, currently at 409 mg/dL. She is motivated to manage through diet and exercise. Continue dietary modifications focusing on the Mediterranean diet and portion control. Encourage regular physical activity to aid in cholesterol management. Recheck the lipid panel in 6 months and follow up with Dr. Micael Combs in August for cholesterol management.   Cardiac Risk Assessment Lengthy discussion about family history as well as current health status in regards to coronary calcium score of 0, mildly elevated lipids, and hypertension.  Lifestyle modification emphasized including heart healthy mostly plant based diet avoiding saturated fat, processed foods, simple carbohydrates, and sugar along with aiming for at least 150 minutes of moderate intensity exercise each week.          Disposition:4 months with Dr. Micael Combs  Signed, Slater Duncan, NP-C

## 2024-02-18 ENCOUNTER — Encounter (HOSPITAL_BASED_OUTPATIENT_CLINIC_OR_DEPARTMENT_OTHER): Payer: Self-pay | Admitting: Nurse Practitioner

## 2024-02-18 ENCOUNTER — Ambulatory Visit (HOSPITAL_BASED_OUTPATIENT_CLINIC_OR_DEPARTMENT_OTHER): Payer: 59 | Admitting: Nurse Practitioner

## 2024-02-18 VITALS — BP 130/90 | HR 88 | Ht 66.0 in | Wt 170.6 lb

## 2024-02-18 DIAGNOSIS — I10 Essential (primary) hypertension: Secondary | ICD-10-CM

## 2024-02-18 DIAGNOSIS — R011 Cardiac murmur, unspecified: Secondary | ICD-10-CM

## 2024-02-18 DIAGNOSIS — E782 Mixed hyperlipidemia: Secondary | ICD-10-CM | POA: Diagnosis not present

## 2024-02-18 DIAGNOSIS — I493 Ventricular premature depolarization: Secondary | ICD-10-CM

## 2024-02-18 DIAGNOSIS — Z7189 Other specified counseling: Secondary | ICD-10-CM

## 2024-02-18 DIAGNOSIS — E7849 Other hyperlipidemia: Secondary | ICD-10-CM

## 2024-02-18 NOTE — Patient Instructions (Addendum)
 Medication Instructions:   Your physician recommends that you continue on your current medications as directed. Please refer to the Current Medication list given to you today.   *If you need a refill on your cardiac medications before your next appointment, please call your pharmacy*  Lab Work:  None ordered.   If you have labs (blood work) drawn today and your tests are completely normal, you will receive your results only by: MyChart Message (if you have MyChart) OR A paper copy in the mail If you have any lab test that is abnormal or we need to change your treatment, we will call you to review the results.  Testing/Procedures:  Your physician has requested that you have an echocardiogram. Echocardiography is a painless test that uses sound waves to create images of your heart. It provides your doctor with information about the size and shape of your heart and how well your heart's chambers and valves are working. This procedure takes approximately one hour. There are no restrictions for this procedure. Please do NOT wear cologne, perfume or lotions (deodorant is allowed). Please arrive 15 minutes prior to your appointment time.  Follow-Up: At [facility], you and your health needs are our priority.  As part of our continuing mission to provide you with exceptional heart care, our providers are all part of one team.  This team includes your primary Cardiologist (physician) and Advanced Practice Providers or APPs (Physician Assistants and Nurse Practitioners) who all work together to provide you with the care you need, when you need it.  Your next appointment:   4 month(s)  Provider:   Gaylyn Keas, MD    We recommend signing up for the patient portal called "MyChart".  Sign up information is provided on this After Visit Summary.  MyChart is used to connect with patients for Virtual Visits (Telemedicine).  Patients are able to view lab/test results, encounter notes,  upcoming appointments, etc.  Non-urgent messages can be sent to your provider as well.   To learn more about what you can do with MyChart, go to [email or web address].   Other Instructions  Tackling Obesity with Lifestyle Changes  Obesity- What is it? And What can we do about it?  Obesity is a chronic complex disease defined as excessive fat deposits that can have a negative effect on our health. It can lead to many other diseases including type 2 diabetes.  Weight gain occurs when the amount of energy (calories) we consume is greater than the amount we use.  When our energy output is greater than our energy input we lose weight. The basic concept is simple, but in reality, it's much more complicated.  Unfortunately, in some people, our bodies have many ways it can compensate when we try to eat less and move more which can prevent us  from changing our weight. This can lead to some people having a much more difficult time losing weight even when they put healthy habits into practice. This can be frustrating. We want to focus on healthy habits, physical activity and how we feel, and less the number on the scale.  Food As Energy  Calories  Calories is just a unit of measurement for energy.  Counting calories is not required to lose weight but counting for a short period of time can:   help you learn good portion sizes   Learn what your true energy needs are.   Help you be more aware of your snacking or grazing habits  To help  calculate how many calories you should be eating, the NIH has a great body weight planner calculator at BeverageBuggy.si  Types of Energy Expenditure  Basal Metabolic Rate (BMR) Energy that our bodies use to preform everyday tasks. More muscle mass through resistance training can increase this a small amount  Thermic Effect of Food The amount of energy that it takes to breakdown the food we eat. This will be highest when we eat protein and fiber rich  foods  Exercise Energy Expenditure The amount of energy used during formal exercise (walking, biking, weightlifting)  Non-exercise activity thermogenesis (NEAT) The amount of energy spent on activities that are not formal exercise (standing, fidgeting). Therefore, it is not only important to do formal exercise but also move around throughout the day.  Managing The Meal  Macro nutrients (carbohydrates, fats and protein, fiber, water)  Micronutrients (vitamins, minerals)  Dietary Fiber  Benefits Examples Cautions  Soluble fiber  Decreases cholesterol  improve blood sugar control,  Feeds our gut bacteria  Allows us  to feel fuller for longer so we eat less  fruits  oats  barley  legumes  peas  Beans  vegetables (broccoli) and root vegetables (carrots) Add fiber into your diet slowly and be sure to drink at least 8 cups of water a day. This will help limit gas, bloating, diarrhea, or constipation.  Insoluble Fiber  Improves digestive health by making stool easier to pass  Allows us  to feel fuller for longer so we eat less  whole grains  nuts  seeds  skin of fruit  vegetables (green beans, zucchini, cauliflower)  Tricks to add more fiber to your diet   Add beans (pinto, kidney, lima, navy and garbanzo) to salads, ground meat or brown rice   Add nuts or seeds and or fresh/frozen fruit to yogurt, cottage cheese, salads or steel cut oats   Cut up vegetables and eat with hummus   Look for unsweetened whole grain cereals with at least 5g of fiber per serving   Switch to whole grain bread. Look for bread that has whole grain flour as the first ingredient and has more fiber than carbs if you were to multiple the fiber x 10.   Try bulgar, barely, quinoa, buckwheat, brown rice wild rice instead of white rice   Keep frozen vegetables on hand to add to dishes or soups  Meal Planning:  Meal planning is the key to setting you up for success. Here are some examples of  healthy meal options.  Breakfast  Option 1: Omelette with vegetables (1 egg, spinach, mushrooms, or other vegetable of your choice), 2 slices whole-grain toast, tip of thumb size butter or soft margarine,  cup low-fat milk or yogurt  Option 2: steel-cut rolled oats (? cup dry), 1 tbsp peanut butter added to cooked oats,  cup low-fat milk.  Option 3: 2 slices whole-grain or rye toast with avocado spread ( small avocado mased with herbs and pepper to taste), 1 poached egg or [place] up (cooked to your liking)  Option 4:  cup plain 0% [place] yogurt topped with  cup berries and  cup walnuts or almonds, 2 slices whole-grain or rye toast, tip of thumb size soft margarine/butter  Lunch:  Option 1: 2 cups red lentil soup, green salad with 1 tbsp homemade vinaigrette (extra virgin olive oil and vinegar of choice plus spices)  Option 2: 3 oz. roasted chicken, 2 slices whole-grain bread, 2 tsp mayonnaise, mustard, lettuce, tomato if desired, 1 fruit (example: medium-sized  apple or small pear)  Option 3: 3 oz. tuna packed in water, 1 whole-wheat pita (6 inch), 2 tsp mayonnaise, lettuce, tomato, or other non-starchy vegetable of your choice, 1 fruit (example: medium-sized apple or small pear)  Option 4: 1 serving of garden veggie buddha bowl with lentils and tahini sauce and 1 cup berries topped with  cup plain 0% Greek yogurt  Dinner:  Option 1: 1 serving roasted cauliflower salad, 3-4 oz. grilled or baked pork loin chop, 1/2 cup mashed potato, or brown rice or quinoa  Option 2: 1 serving fish (baked, grilled or air fried), green salad, 1 tbsp homemade vinaigrette,  cup cooked couscous  Option 3: 1 cup cooked whole grained pasta (example: spaghetti, spirals, macaroni),  cup favorite pasta sauce (preferably homemade), 3-4 oz. grilled or baked chicken, green salad, 1 tbsp homemade vinaigrette  Option 4: 1 serving oven roasted salmon,  cup mashed sweet potato or couscous or brown rice or  quinoa, broccoli (steamed or roasted)  Healthy snacks:   Carrots or celery with 1 tbsp of hummus   1 medium-sized fruit (apple or orange)   1 cup plain 0% [place] yogurt with  cup berries   Half apple, sliced, with 1 tbsp (15 mL) peanut or almond butter  Dining out:  Eating away from home has become a part of many people's lifestyle. Making healthy choices when you are eating out is important too. Portion size is an important part of healthy choices. Most branded fast-food places provide calories, sodium, and fat content for their menu items. www.calorieking.com would be great resource to find nutrition facts for your favorite brands and fast-food restaurants. Company specific website can be Chief Technology Officer for nutrition information for their items. (e.g. www.mcdonalds.com or www.nutritionix.com/[place]/menu/premium)  Here are some tips to help you make wise food choices when you are dining out.  Chose more often Avoid  Beverages   Choose more often: Water, low fat milk  Sugar-free/diet drinks  Unsweet tea or coffee    Avoid: Milkshakes, fruit drinks, regular pop  Alcohol, specialty drinks (e.g. iced cappuccino)  Fast food  Choose more often:  Garden salad  Mini subs, pita sandwiches ect with extra vegetables  plain burgers, grilled chicken  Vegetarian or cheese pizza with whole-grain crust    Avoid: Burgers/sandwiches with bacon, cheese, and high-fat sauces  [place] fries, fried chicken, fried fish, poutine, hash browns  Pizza with processed meats  Starters   Choose more often: Raw vegetables, salads (garden, spinach, fruit)  clear or vegetable soups  Seafood cocktail  Whole-grain breads and rolls    Avoid: Salads with high-fat dressings or toppings  Creamy soups  Wings, egg rolls  onion rings, nachos  White or garlic bread  Main courses Grains & Starches (amount equal to  of your plate)  Choose more often:  Oatmeal, high-fiber/lower-sugar cereals   Whole-grain breads, rice, pasta, barley, couscous  Sweet potatoes    Avoid: Sugary, low-fiber cereals  Large bagels, muffins, croissants, white bread  [place] fries, hash browns, fried rice   Meat and alternative (amount equal to  of your plate)  Choose more often:  Lean meats, poultry, fish, eggs, low-fat cheese  Tofu, vegetable protein Legumes (e.g. lentils, chickpeas, beans)    Avoid: High-salt and/or high-fat meats (e.g. ribs, wings, sausages, wieners, processed lunch meats, imposter meats)   Vegetables (amount equal to  of your plate)  Choose more often:  Salads ([place], garden, spinach), plain vegetables   Avoid:  Salads with creamy, high-fat  dressings and   Vegetables on sandwiches ect toppings like bacon bits, croutons, cheese  Desserts  Choose more often:  Fresh fruit, frozen yogourt, skim milk latte    Avoid: Cakes, pies, pastries, ice cream, cheesecake

## 2024-02-19 ENCOUNTER — Encounter (HOSPITAL_BASED_OUTPATIENT_CLINIC_OR_DEPARTMENT_OTHER): Payer: Self-pay | Admitting: Nurse Practitioner

## 2024-02-23 ENCOUNTER — Encounter (HOSPITAL_BASED_OUTPATIENT_CLINIC_OR_DEPARTMENT_OTHER): Payer: Self-pay

## 2024-02-23 ENCOUNTER — Other Ambulatory Visit: Payer: Self-pay

## 2024-02-23 ENCOUNTER — Other Ambulatory Visit (HOSPITAL_COMMUNITY): Payer: Self-pay

## 2024-03-04 ENCOUNTER — Other Ambulatory Visit (HOSPITAL_COMMUNITY): Payer: Self-pay

## 2024-03-05 ENCOUNTER — Other Ambulatory Visit: Payer: Self-pay

## 2024-03-05 ENCOUNTER — Ambulatory Visit (HOSPITAL_BASED_OUTPATIENT_CLINIC_OR_DEPARTMENT_OTHER): Attending: Family Medicine | Admitting: Physical Therapy

## 2024-03-05 ENCOUNTER — Encounter (HOSPITAL_BASED_OUTPATIENT_CLINIC_OR_DEPARTMENT_OTHER): Payer: Self-pay | Admitting: Physical Therapy

## 2024-03-05 DIAGNOSIS — R262 Difficulty in walking, not elsewhere classified: Secondary | ICD-10-CM | POA: Diagnosis present

## 2024-03-05 DIAGNOSIS — M25561 Pain in right knee: Secondary | ICD-10-CM | POA: Insufficient documentation

## 2024-03-05 DIAGNOSIS — M6281 Muscle weakness (generalized): Secondary | ICD-10-CM | POA: Insufficient documentation

## 2024-03-05 NOTE — Therapy (Signed)
 OUTPATIENT PHYSICAL THERAPY LOWER EXTREMITY EVALUATION   Patient Name: Megan Combs MRN: [id number] DOB:[birth year], 63 y.o., female Today's Date: 03/05/2024  END OF SESSION:  PT End of Session - 03/05/24 0859     Visit Number 1    Number of Visits 12    Date for PT Re-Evaluation 06/03/24    Authorization Type [employer]    PT Start Time 0851   late   PT Stop Time 0935    PT Time Calculation (min) 44 min    Activity Tolerance Patient tolerated treatment well    Behavior During Therapy WFL for tasks assessed/performed             Past Medical History:  Diagnosis Date   Allergy    Anemia    Anxiety    Arthritis    right knee   Degenerative disc disease    Depression    Family history of early CAD    Fibromyalgia    Hyperlipidemia    IBS (irritable bowel syndrome)    Lumbar radiculitis    Plantar fasciitis of left foot    PVC (premature ventricular contraction)    Salmonella poisoning    Past Surgical History:  Procedure Laterality Date   COLONOSCOPY  last 08/16/2014   EYE SURGERY Right 05/2019   Cataract   KNEE ARTHROSCOPY     KNEE ARTHROSCOPY     laproscopy     POLYPECTOMY     TONSILLECTOMY     Patient Active Problem List   Diagnosis Date Noted   Rhinitis, chronic 11/10/2017   Left ankle injury, subsequent encounter 06/21/2017   Medication intolerance 07/04/2014   Family history of early CAD 06/29/2014   Palpitations 06/29/2014   Hyperlipidemia    PVC (premature ventricular contraction)    Low back pain 06/20/2014   Right hip pain 02/15/2014   Right shoulder injury 02/01/2014   Salmonella 10/27/2013   Breast density 03/17/2013   Renal cyst, left 01/06/2013   Family history of colon cancer 01/03/2013   Elevated alkaline phosphatase measurement 01/03/2013   Articular cartilage disorder of knee 12/24/2012   Plantar fasciitis 07/21/2012   Anxiety 07/02/2012   History of anemia 07/01/2012   Depression 07/01/2012   Menopausal hot flushes  07/01/2012   DJD (degenerative joint disease) 07/01/2012   Vaginal dryness, menopausal 07/01/2012   Urge incontinence of urine 07/01/2012   Fibromyalgia    Family history of malignant neoplasm 12/25/2011   History of colonic polyps 12/25/2011    PCP: Arva Lathe, MD   REFERRING PROVIDER:  Salina Craver, MD     REFERRING DIAG:  M25.561 (ICD-10-CM) - Right knee pain, unspecified chronicity      THERAPY DIAG:  Pain in joint of right knee  Muscle weakness (generalized)  Difficulty walking  Rationale for Evaluation and Treatment: Rehabilitation  ONSET DATE: 1986 R knee debridement , patellar "realignment"   SUBJECTIVE:   SUBJECTIVE STATEMENT: Pt reports that over the last 9 months she has been been trying to get certified with yoga. She noticed an "extra pop" when she goes to extend the knee. Pt has history of PFPS with knee arthroscopy. She feels it is about 50% better than when she first saw MD. The knee does feel stiff but takes some time to get into extension. Pt is considering steroid injection in the future. Pt did do HEP with the MD. Pt's has concern with recurrent popping. Pt denies red flags.   [place] Exercise: Walking, swimming, yoga  PERTINENT  HISTORY: HLD, HTN,  PAIN:  Are you having pain? No: NPRS scale: 5/10 with movement, 0/10 rest Pain location: inferior patellar pole Pain description: fullness  Aggravating factors: child's pose; deep knee flexion; seated hip hinge position Relieving factors: sleeve, naproxen, ice  PRECAUTIONS: None  RED FLAGS: None   WEIGHT BEARING RESTRICTIONS: No  FALLS:  Has patient fallen in last 6 months? No  LIVING ENVIRONMENT: Lives with: lives with their family and lives with their spouse Lives in: House/apartment Stairs: 2 story home  Has following equipment at home: None  OCCUPATION: school nurse; lots of sitting   PLOF: Independent  PATIENT GOALS: return to normal exercise   OBJECTIVE:   Note: Objective measures were completed at Evaluation unless otherwise noted.  DIAGNOSTIC FINDINGS: N/A  PATIENT SURVEYS:  LEFS Lower Extremity Functional Score: 51 / 80 = 63.7 %  COGNITION: Overall cognitive status: Within functional limits for tasks assessed     SENSATION: WFL   MUSCLE LENGTH: Positive Ely's   POSTURE: No Significant postural limitations  TTP of R quad, quad tendon, Mild edema and swelling noted into polpliteal space and suprapatellar pouch; TTP of lateral patellar border    GAIT: Comments: R flexed knee gait, lack of full TKE   AROM Right 5/9 Left 5/9  Hip flexion    Hip extension    Hip abduction    Hip adduction    Hip internal rotation    Hip external rotation    Knee flexion 125 130  Knee extension -5 5  Ankle dorsiflexion    Ankle plantarflexion    Ankle inversion    Ankle eversion     (Blank rows = not tested)    MMT Right 5/9 Left 5/9  Hip flexion    Hip extension    Hip abduction    Hip adduction    Hip internal rotation    Hip external rotation    Knee flexion 29.0 29.7  Knee extension 37.8 @ 45 26.1 @ 90 34.2  Ankle dorsiflexion    Ankle plantarflexion    Ankle inversion    Ankle eversion     (Blank rows = not tested)   TODAY'S TREATMENT:                                                                                                                              DATE:   5/9  Program Notes knee flexion gapping 20x with towel behind knee  Exercises - Sitting Knee Extension with Resistance  - 1 x daily - 4-5 x weekly - 3 sets - 10 reps - 3 hold - Standing Terminal Knee Extension with Resistance  - 1 x daily - 4-5 x weekly - 3 sets - 10 reps    PATIENT EDUCATION:  Education details: MOI, diagnosis, prognosis, anatomy, exercise progression, DOMS expectations, muscle firing,  envelope of function, HEP, POC Person educated: Patient Education method: Explanation, VC, handouts Education comprehension: verbalized  understanding,  requires further education   HOME EXERCISE PROGRAM:    Access Code: [email or web address] URL: [email or web address] Date: 03/05/2024 Prepared by: Silver Dross     CLINICAL IMPRESSION: Patient is a 63 y.o. female who was seen today for physical therapy evaluation and treatment for c/c of R knee pain. 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 No internal derangement or ligamentous instability noted with clinical testing. Clinical testing in NWB position asymptomatic. Pt's pain is minimally sensitive and irritable with movement. Plan to continue with R quad strength, edema management,  and ROM at future sessions. Pt would benefit from continued skilled therapy in order to reach goals and maximize functional L LE strength and ROM for full return to normal ADL and occupation.    OBJECTIVE IMPAIRMENTS decreased activity tolerance, decreased mobility, difficulty walking, decreased ROM, decreased strength, hypomobility, increased edema, increased muscle spasms, impaired flexibility, improper body mechanics, and pain.    ACTIVITY LIMITATIONS cleaning, community activity, driving, meal prep, occupation, laundry, yard work, shopping, school.    PERSONAL FACTORS  Age, fitness, and 1-2 comorbidities  are also affecting patient's functional outcome.     REHAB POTENTIAL: Good   CLINICAL DECISION MAKING: Stable/uncomplicated   EVALUATION COMPLEXITY: Low     GOALS:     SHORT TERM GOALS: Target date: 04/16/2024     Pt will become independent with HEP in order to demonstrate synthesis of PT education.     Goal status: INITIAL   2. Pt will have an at least 9 pt improvement in LEFS measure in order to demonstrate MCID improvement in daily function.     Goal status: INITIAL   3.  Pt will be able to demonstrate at least 10lb increase in HHD knee extension measure at 90 deg of flexion in order to demonstrate functional improvement in R knee extensor strength  for improvement in functional mobility, gait quality, and community ambulation.    Goal status: INITIAL       LONG TERM GOALS: Target date: 05/28/2024       Pt  will become independent with final HEP in order to demonstrate synthesis of PT education.     Goal status: INITIAL   2. Pt will have an at least 18 pt improvement in LEFS measure in order to demonstrate MCID improvement in daily function.    Goal status: INITIAL   3.  Pt will be able to lift/squat/hold >30 lbs in order to demonstrate functional improvement in R  LE strength for return to PLOF and occupation.    Goal status: INITIAL   4.  Pt will be able to demonstrate/report ability to walk 30 mins on varying terrain without pain in order to demonstrate functional improvement and tolerance to exercise and community mobility.     Goal status: INITIAL     PLAN: PT FREQUENCY: 1-2x/week   PT DURATION: 12 weeks (plan to DC by 8)   PLANNED INTERVENTIONS: Therapeutic exercises, Therapeutic activity, Neuromuscular re-education, Balance training, Gait training, Patient/Family education, Joint manipulation, Joint mobilization, Stair training, Orthotic/Fit training, DME instructions, Aquatic Therapy, Dry Needling, Electrical stimulation, Spinal manipulation, Spinal mobilization, Cryotherapy, Moist heat, Compression bandaging, scar mobilization, Taping, Vasopneumatic device, Traction, Ultrasound, Ionotophoresis 4mg /ml Dexamethasone , and Manual therapy   PLAN FOR NEXT SESSION: review HEP, progress ROM, R knee TKE, R quad strength    Silver Dross, PT 03/05/2024, 10:20 AM

## 2024-03-08 ENCOUNTER — Other Ambulatory Visit (HOSPITAL_COMMUNITY): Payer: Self-pay

## 2024-03-08 MED ORDER — BUPROPION HCL ER (XL) 150 MG PO TB24
ORAL_TABLET | ORAL | 1 refills | Status: AC
  Filled 2024-03-08: qty 90, 30d supply, fill #0
  Filled 2024-04-10: qty 90, 30d supply, fill #1
  Filled 2024-05-11: qty 90, 30d supply, fill #2

## 2024-03-08 MED ORDER — FETZIMA 20 MG PO CP24
20.0000 mg | ORAL_CAPSULE | Freq: Every morning | ORAL | 4 refills | Status: AC
  Filled 2024-03-08: qty 30, 30d supply, fill #0
  Filled 2024-04-08: qty 30, 30d supply, fill #1
  Filled 2024-05-11: qty 30, 30d supply, fill #2
  Filled 2024-06-13: qty 30, 30d supply, fill #3
  Filled 2024-07-12: qty 30, 30d supply, fill #4

## 2024-03-09 ENCOUNTER — Other Ambulatory Visit (HOSPITAL_COMMUNITY): Payer: Self-pay

## 2024-03-10 ENCOUNTER — Other Ambulatory Visit (HOSPITAL_COMMUNITY): Payer: Self-pay

## 2024-03-11 ENCOUNTER — Ambulatory Visit (HOSPITAL_BASED_OUTPATIENT_CLINIC_OR_DEPARTMENT_OTHER): Admitting: Physical Therapy

## 2024-03-17 ENCOUNTER — Ambulatory Visit (HOSPITAL_BASED_OUTPATIENT_CLINIC_OR_DEPARTMENT_OTHER): Admitting: Physical Therapy

## 2024-03-17 ENCOUNTER — Encounter (HOSPITAL_BASED_OUTPATIENT_CLINIC_OR_DEPARTMENT_OTHER): Payer: Self-pay | Admitting: Physical Therapy

## 2024-03-17 DIAGNOSIS — M25561 Pain in right knee: Secondary | ICD-10-CM | POA: Diagnosis not present

## 2024-03-17 DIAGNOSIS — R262 Difficulty in walking, not elsewhere classified: Secondary | ICD-10-CM

## 2024-03-17 DIAGNOSIS — M6281 Muscle weakness (generalized): Secondary | ICD-10-CM

## 2024-03-17 NOTE — Therapy (Signed)
 OUTPATIENT PHYSICAL THERAPY LOWER EXTREMITY EVALUATION   Patient Name: Megan Combs MRN: [id number] DOB:05-17-1961, 63 y.o., female Today's Date: 03/17/2024  END OF SESSION:  PT End of Session - 03/17/24 1537     Visit Number 2    Number of Visits 12    Date for PT Re-Evaluation 06/03/24    Authorization Type [employer]    PT Start Time 1530    PT Stop Time 1614    PT Time Calculation (min) 44 min    Activity Tolerance Patient tolerated treatment well    Behavior During Therapy WFL for tasks assessed/performed              Past Medical History:  Diagnosis Date   Allergy    Anemia    Anxiety    Arthritis    right knee   Degenerative disc disease    Depression    Family history of early CAD    Fibromyalgia    Hyperlipidemia    IBS (irritable bowel syndrome)    Lumbar radiculitis    Plantar fasciitis of left foot    PVC (premature ventricular contraction)    Salmonella poisoning    Past Surgical History:  Procedure Laterality Date   COLONOSCOPY  last 08/16/2014   EYE SURGERY Right 05/2019   Cataract   KNEE ARTHROSCOPY     KNEE ARTHROSCOPY     laproscopy     POLYPECTOMY     TONSILLECTOMY     Patient Active Problem List   Diagnosis Date Noted   Rhinitis, chronic 11/10/2017   Left ankle injury, subsequent encounter 06/21/2017   Medication intolerance 07/04/2014   Family history of early CAD 06/29/2014   Palpitations 06/29/2014   Hyperlipidemia    PVC (premature ventricular contraction)    Low back pain 06/20/2014   Right hip pain 02/15/2014   Right shoulder injury 02/01/2014   Salmonella 10/27/2013   Breast density 03/17/2013   Renal cyst, left 01/06/2013   Family history of colon cancer 01/03/2013   Elevated alkaline phosphatase measurement 01/03/2013   Articular cartilage disorder of knee 12/24/2012   Plantar fasciitis 07/21/2012   Anxiety 07/02/2012   History of anemia 07/01/2012   Depression 07/01/2012   Menopausal hot flushes 07/01/2012    DJD (degenerative joint disease) 07/01/2012   Vaginal dryness, menopausal 07/01/2012   Urge incontinence of urine 07/01/2012   Fibromyalgia    Family history of malignant neoplasm 12/25/2011   History of colonic polyps 12/25/2011    PCP: Arva Lathe, MD   REFERRING PROVIDER:  Salina Craver, MD     REFERRING DIAG:  M25.561 (ICD-10-CM) - Right knee pain, unspecified chronicity      THERAPY DIAG:  Pain in joint of right knee  Muscle weakness (generalized)  Difficulty walking  Rationale for Evaluation and Treatment: Rehabilitation  ONSET DATE: 1986 R knee debridement , patellar "realignment"   SUBJECTIVE:   SUBJECTIVE STATEMENT:  Pt notes the knee was sore after HEP but it has improved. She has not had pain since.   Eval: Pt reports that over the last 9 months she has been been trying to get certified with yoga. She noticed an "extra pop" when she goes to extend the knee. Pt has history of PFPS with knee arthroscopy. She feels it is about 50% better than when she first saw MD. The knee does feel stiff but takes some time to get into extension. Pt is considering steroid injection in the future. Pt did do HEP with  the MD. Pt's has concern with recurrent popping. Pt denies red flags.   [place] Exercise: Walking, swimming, yoga  PERTINENT HISTORY: HLD, HTN,  PAIN:  Are you having pain? No: NPRS scale: 5/10 with movement, 0/10 rest Pain location: inferior patellar pole Pain description: fullness  Aggravating factors: child's pose; deep knee flexion; seated hip hinge position Relieving factors: sleeve, naproxen, ice  PRECAUTIONS: None  RED FLAGS: None   WEIGHT BEARING RESTRICTIONS: No  FALLS:  Has patient fallen in last 6 months? No  LIVING ENVIRONMENT: Lives with: lives with their family and lives with their spouse Lives in: House/apartment Stairs: 2 story home  Has following equipment at home: None  OCCUPATION: school nurse; lots of  sitting   PLOF: Independent  PATIENT GOALS: return to normal exercise   OBJECTIVE:  Note: Objective measures were completed at Evaluation unless otherwise noted.  DIAGNOSTIC FINDINGS: N/A  PATIENT SURVEYS:  LEFS Lower Extremity Functional Score: 51 / 80 = 63.7 %  COGNITION: Overall cognitive status: Within functional limits for tasks assessed     SENSATION: WFL   MUSCLE LENGTH: Positive Ely's   POSTURE: No Significant postural limitations  TTP of R quad, quad tendon, Mild edema and swelling noted into polpliteal space and suprapatellar pouch; TTP of lateral patellar border    GAIT: Comments: R flexed knee gait, lack of full TKE   AROM Right 5/9 Left 5/9  Hip flexion    Hip extension    Hip abduction    Hip adduction    Hip internal rotation    Hip external rotation    Knee flexion 125 130  Knee extension -5 5  Ankle dorsiflexion    Ankle plantarflexion    Ankle inversion    Ankle eversion     (Blank rows = not tested)    MMT Right 5/9 Left 5/9  Hip flexion    Hip extension    Hip abduction    Hip adduction    Hip internal rotation    Hip external rotation    Knee flexion 29.0 29.7  Knee extension 37.8 @ 45 26.1 @ 90 34.2  Ankle dorsiflexion    Ankle plantarflexion    Ankle inversion    Ankle eversion     (Blank rows = not tested)   TODAY'S TREATMENT:                                                                                                                              DATE:   5/21  Recumbent lvl 1 5 min  TKE and knee flexion mob grade IV (flexion 135, 3 deg hyper)   Prone quad stretch 30s 3x R LE SL leg press white machine 50lbs 3x8 SL knee ext whie machine 15lbs 3x8 8" runner's step up 3x8 SL RDL 3x8  5/9  Program Notes knee flexion gapping 20x with towel behind knee  Exercises - Sitting Knee Extension with Resistance  - 1 x  daily - 4-5 x weekly - 3 sets - 10 reps - 3 hold - Standing Terminal Knee Extension with  Resistance  - 1 x daily - 4-5 x weekly - 3 sets - 10 reps    PATIENT EDUCATION:  Education details: anatomy, exercise progression, DOMS expectations, muscle firing,  envelope of function, HEP, POC Person educated: Patient Education method: Explanation, VC, handouts Education comprehension: verbalized understanding, requires further education   HOME EXERCISE PROGRAM:    Access Code: [email or web address] URL: [email or web address] Date: 03/05/2024 Prepared by: Silver Dross     CLINICAL IMPRESSION: Pt able to improve to terminal knee flexion and ext symmetrical to L following joint mobilizations. Strengthening and SL motor control exercise progressed without pain though does fatigue quickly. HEP updated accordingly. Pt progressing well and pain is better managed at today's session. Plan to re-assess at  next for swelling response and progress hip hinge and squatting exercise. Pt would benefit from continued skilled therapy in order to reach goals and maximize functional L LE strength and ROM for full return to normal ADL and occupation.    OBJECTIVE IMPAIRMENTS decreased activity tolerance, decreased mobility, difficulty walking, decreased ROM, decreased strength, hypomobility, increased edema, increased muscle spasms, impaired flexibility, improper body mechanics, and pain.    ACTIVITY LIMITATIONS cleaning, community activity, driving, meal prep, occupation, laundry, yard work, shopping, school.    PERSONAL FACTORS  Age, fitness, and 1-2 comorbidities  are also affecting patient's functional outcome.     REHAB POTENTIAL: Good   CLINICAL DECISION MAKING: Stable/uncomplicated   EVALUATION COMPLEXITY: Low     GOALS:     SHORT TERM GOALS: Target date: 04/16/2024     Pt will become independent with HEP in order to demonstrate synthesis of PT education.     Goal status: INITIAL   2. Pt will have an at least 9 pt improvement in LEFS measure in order to demonstrate MCID improvement  in daily function.     Goal status: INITIAL   3.  Pt will be able to demonstrate at least 10lb increase in HHD knee extension measure at 90 deg of flexion in order to demonstrate functional improvement in R knee extensor strength for improvement in functional mobility, gait quality, and community ambulation.    Goal status: INITIAL       LONG TERM GOALS: Target date: 05/28/2024       Pt  will become independent with final HEP in order to demonstrate synthesis of PT education.     Goal status: INITIAL   2. Pt will have an at least 18 pt improvement in LEFS measure in order to demonstrate MCID improvement in daily function.    Goal status: INITIAL   3.  Pt will be able to lift/squat/hold >30 lbs in order to demonstrate functional improvement in R  LE strength for return to PLOF and occupation.    Goal status: INITIAL   4.  Pt will be able to demonstrate/report ability to walk 30 mins on varying terrain without pain in order to demonstrate functional improvement and tolerance to exercise and community mobility.     Goal status: INITIAL     PLAN: PT FREQUENCY: 1-2x/week   PT DURATION: 12 weeks (plan to DC by 8)   PLANNED INTERVENTIONS: Therapeutic exercises, Therapeutic activity, Neuromuscular re-education, Balance training, Gait training, Patient/Family education, Joint manipulation, Joint mobilization, Stair training, Orthotic/Fit training, DME instructions, Aquatic Therapy, Dry Needling, Electrical stimulation, Spinal manipulation, Spinal mobilization, Cryotherapy, Moist heat, Compression bandaging, scar mobilization, Taping, Vasopneumatic  device, Traction, Ultrasound, Ionotophoresis 4mg /ml Dexamethasone , and Manual therapy   PLAN FOR NEXT SESSION: review HEP, progress ROM, R knee TKE, R quad strength    Silver Dross, PT 03/17/2024, 4:16 PM

## 2024-03-18 ENCOUNTER — Ambulatory Visit (INDEPENDENT_AMBULATORY_CARE_PROVIDER_SITE_OTHER)

## 2024-03-18 ENCOUNTER — Encounter (HOSPITAL_BASED_OUTPATIENT_CLINIC_OR_DEPARTMENT_OTHER): Payer: Self-pay

## 2024-03-18 DIAGNOSIS — E782 Mixed hyperlipidemia: Secondary | ICD-10-CM

## 2024-03-18 DIAGNOSIS — I493 Ventricular premature depolarization: Secondary | ICD-10-CM

## 2024-03-18 DIAGNOSIS — R011 Cardiac murmur, unspecified: Secondary | ICD-10-CM | POA: Diagnosis not present

## 2024-03-18 LAB — ECHOCARDIOGRAM COMPLETE
Area-P 1/2: 4.21 cm2
S' Lateral: 2.28 cm

## 2024-03-19 ENCOUNTER — Ambulatory Visit (HOSPITAL_BASED_OUTPATIENT_CLINIC_OR_DEPARTMENT_OTHER): Payer: Self-pay | Admitting: Family

## 2024-03-19 NOTE — Telephone Encounter (Signed)
 Please review and advise.

## 2024-03-19 NOTE — Telephone Encounter (Signed)
 Recommend aiming for 150 minutes of moderate intensity activity per week and following a heart healthy diet. The American Heart Association website has great resources for heart healthy diet.  No changes needed based on echocardiogram. Her echocardiogram is normal for her age/gender. Would recommend monitoring BP at home 2-3 times per week and if consistently more than 130/80 let us  know and would consider medication changes at that time.  Megan Sheriff S Gianah Batt, NP

## 2024-03-23 ENCOUNTER — Other Ambulatory Visit (HOSPITAL_COMMUNITY): Payer: Self-pay

## 2024-03-24 ENCOUNTER — Other Ambulatory Visit (HOSPITAL_COMMUNITY): Payer: Self-pay

## 2024-03-24 ENCOUNTER — Encounter (HOSPITAL_BASED_OUTPATIENT_CLINIC_OR_DEPARTMENT_OTHER): Admitting: Physical Therapy

## 2024-03-31 ENCOUNTER — Ambulatory Visit: Attending: Cardiology | Admitting: Cardiology

## 2024-03-31 ENCOUNTER — Encounter: Payer: Self-pay | Admitting: Cardiology

## 2024-03-31 VITALS — BP 158/86 | HR 93 | Ht 66.0 in | Wt 170.0 lb

## 2024-03-31 DIAGNOSIS — I10 Essential (primary) hypertension: Secondary | ICD-10-CM | POA: Insufficient documentation

## 2024-03-31 DIAGNOSIS — I493 Ventricular premature depolarization: Secondary | ICD-10-CM

## 2024-03-31 DIAGNOSIS — E7800 Pure hypercholesterolemia, unspecified: Secondary | ICD-10-CM

## 2024-03-31 DIAGNOSIS — Z8249 Family history of ischemic heart disease and other diseases of the circulatory system: Secondary | ICD-10-CM

## 2024-03-31 NOTE — Patient Instructions (Signed)
 Medication Instructions:  Your physician recommends that you continue on your current medications as directed. Please refer to the Current Medication list given to you today.  *If you need a refill on your cardiac medications before your next appointment, please call your pharmacy*  Lab Work: None.  If you have labs (blood work) drawn today and your tests are completely normal, you will receive your results only by: MyChart Message (if you have MyChart) OR A paper copy in the mail If you have any lab test that is abnormal or we need to change your treatment, we will call you to review the results.  Testing/Procedures: None.  Follow-Up: At [facility], you and your health needs are our priority.  As part of our continuing mission to provide you with exceptional heart care, our providers are all part of one team.  This team includes your primary Cardiologist (physician) and Advanced Practice Providers or APPs (Physician Assistants and Nurse Practitioners) who all work together to provide you with the care you need, when you need it.  Your next appointment will be as needed and it will be with:     Provider:   Gaylyn Keas, MD

## 2024-03-31 NOTE — Progress Notes (Signed)
 Cardiology Note  Date:  03/31/2024   ID:  Megan Combs, DOB 09/08/1961, MRN [id number]  PCP:  Arva Lathe, MD  Cardiologist:  Gaylyn Keas, MD    Referring MD: Arva Lathe,*   Chief Complaint  Patient presents with   Follow-up    HLD, fm hx of CAD, PVCs, palpitations    History of Present Illness:    Megan Combs is a 63 y.o. female with a hx of dyslipidemia, family history of CAD and palpitations with heart monitor showing PVC's.  She had a coronary Ca score of 0 11/2023. She was dx with HTN in February and started on Amlodipine .   She is here today for followup and is doing well.  She has noticed that she has been having some DOE at times but has gained over 10lbs. She also had not been exercising much recently.  She denies any chest pain or pressure,  PND, orthopnea, dizziness or syncope. She rarely will have a fluttering in her chest and feels like she needs to take a breath. She has had some mild LE edema after driving for a few hours or standing too long which started after going on amlodipine . She is compliant with her meds and is tolerating meds with no SE.    Past Medical History:  Diagnosis Date   Allergy    Anemia    Anxiety    Arthritis    right knee   Degenerative disc disease    Depression    Family history of early CAD    Fibromyalgia    HTN (hypertension), benign    Hyperlipidemia    IBS (irritable bowel syndrome)    Lumbar radiculitis    Plantar fasciitis of left foot    PVC (premature ventricular contraction)    Salmonella poisoning     Past Surgical History:  Procedure Laterality Date   COLONOSCOPY  last 08/16/2014   EYE SURGERY Right 05/2019   Cataract   KNEE ARTHROSCOPY     KNEE ARTHROSCOPY     laproscopy     POLYPECTOMY     TONSILLECTOMY      Current Medications: Current Meds  Medication Sig   acetic acid -hydrocortisone  (VOSOL -HC) OTIC solution Place 4 drops into both ears nightly for 2 weeks, then  use as needed for itching.   albuterol  (VENTOLIN  HFA) 108 (90 Base) MCG/ACT inhaler Use 2 puffs as needed for wheezing every 4 - 6 hours   amLODipine  (NORVASC ) 10 MG tablet Take 1 tablet (10 mg total) by mouth daily.   Azelastine -Fluticasone  137-50 MCG/ACT SUSP Place 1 spray into the nose 2 (two) times daily.   B Complex-C-E-Zn (B COMPLEX-C-E-ZINC) tablet Take by mouth.   buPROPion  (WELLBUTRIN  XL) 150 MG 24 hr tablet Take 3 tablets by mouth each morning   Calcium Carbonate-Vitamin D  (CALCIUM 500 + D) 500-125 MG-UNIT TABS Take 1 tablet by mouth daily.   diclofenac  (VOLTAREN ) 75 MG EC tablet as needed.   estradiol  (ESTRACE ) 0.5 MG tablet Take 0.5 tablets (0.25 mg total) by mouth daily.   fluticasone  (FLONASE ) 50 MCG/ACT nasal spray Place 2 sprays into both nostrils every evening.   Glucosamine Sulfate 500 MG CAPS Take by mouth.   ibuprofen  (ADVIL ) 800 MG tablet Take 1 tablet (800 mg total) by mouth 2 (two) times daily as needed. (Patient taking differently: Take 800 mg by mouth as needed.)   Levomilnacipran  HCl ER (FETZIMA ) 20 MG CP24 Take 1 capsule (20 mg total) by mouth every morning.  Multiple Vitamin (MULITIVITAMIN WITH MINERALS) TABS Take 1 tablet by mouth daily.   nystatin  ointment (MYCOSTATIN ) Apply to affected area 2 times a day for 7 days (Patient taking differently: Apply topically as needed.)   progesterone  (PROMETRIUM ) 100 MG capsule Take 1 capsule (100 mg total) by mouth at bedtime every other night   triamcinolone  cream (KENALOG ) 0.5 % Apply 1 application twice a day for 7 days (Patient taking differently: Apply topically as needed.)     Allergies:   Morphine and codeine, Other, Penicillins, Zithromax  [azithromycin ], Effexor  [venlafaxine ], Erythromycin, Prednisone , and Tape   Social History   Socioeconomic History   Marital status: Married    Spouse name: Not on file   Number of children: 0   Years of education: Not on file   Highest education level: Not on file   Occupational History   Occupation: Teacher, adult education: [employer]  Tobacco Use   Smoking status: Never   Smokeless tobacco: Never  Vaping Use   Vaping status: Never Used  Substance and Sexual Activity   Alcohol use: No    Alcohol/week: 0.0 standard drinks of alcohol   Drug use: No   Sexual activity: Yes    Birth control/protection: Post-menopausal  Other Topics Concern   Not on file  Social History Narrative   Not on file   Social Drivers of Health   Financial Resource Strain: Not on file  Food Insecurity: No Food Insecurity (12/05/2023)   Hunger Vital Sign    Worried About Running Out of Food in the Last Year: Never true    Ran Out of Food in the Last Year: Never true  Transportation Needs: No Transportation Needs (12/05/2023)   PRAPARE - Administrator, Civil Service (Medical): No    Lack of Transportation (Non-Medical): No  Physical Activity: Not on file  Stress: Not on file  Social Connections: Unknown (03/11/2022)   Received from [facility]   Social Network    Social Network: Not on file     Family History: The patient's family history includes Arthritis in her mother; Cancer in her father and maternal grandmother; Colon cancer in her father; Colon polyps in her father; Diabetes in her father, maternal grandfather, and paternal grandfather; Esophageal cancer in her father; Heart attack in her mother; Heart disease in her father, maternal grandfather, mother, and paternal grandfather; Hyperlipidemia in her brother and father; Hypertension in her brother and father; Mental illness in her maternal grandfather and mother; Prostate cancer in her father; Stomach cancer in her father; Stroke in her father and maternal grandfather. There is no history of Pancreatic cancer or Rectal cancer.  ROS:   Please see the history of present illness.    ROS  All other systems reviewed and negative.   EKGs/Labs/Other Studies  Reviewed:    The following studies were reviewed today:      Recent Labs: 12/07/2023: BUN 23; Creatinine, Ser 0.81; Hemoglobin 12.8; Platelets 193; Potassium 3.8; Sodium 140 12/15/2023: ALT 21   Recent Lipid Panel    Component Value Date/Time   CHOL 214 (H) 12/15/2023 0828   CHOL 164 12/05/2023 0000   TRIG 150 (H) 12/15/2023 0828   TRIG 50 12/05/2023 0000   HDL 73 12/15/2023 0828   CHOLHDL 2.9 12/15/2023 0828   CHOLHDL 2.3 07/26/2016 0811   VLDL 10 07/26/2016 0811   LDLCALC 115 (H) 12/15/2023 0828    Physical Exam:  VS:  BP (!) 158/86 (BP Location: Left Arm, Patient Position: Sitting, Cuff Size: Normal)   Pulse 93   Ht 5\' 6"  (1.676 m)   Wt 170 lb (77.1 kg)   LMP 10/28/2008   SpO2 96%   BMI 27.44 kg/m     Wt Readings from Last 3 Encounters:  03/31/24 170 lb (77.1 kg)  02/18/24 170 lb 9.6 oz (77.4 kg)  02/02/24 166 lb (75.3 kg)     GEN: Well nourished, well developed in no acute distress HEENT: Normal NECK: No JVD; No carotid bruits LYMPHATICS: No lymphadenopathy CARDIAC:RRR, no murmurs, rubs, gallops RESPIRATORY:  Clear to auscultation without rales, wheezing or rhonchi  ABDOMEN: Soft, non-tender, non-distended MUSCULOSKELETAL:  No edema; No deformity  SKIN: Warm and dry NEUROLOGIC:  Alert and oriented x 3 PSYCHIATRIC:  Normal affect   ASSESSMENT:    1. PVC (premature ventricular contraction)   2. Pure hypercholesterolemia   3. Family history of early CAD   4. HTN (hypertension), benign      PLAN:    In order of problems listed above:  PVCs  -Palpitations are stable and occur once every few months  HLD  - LDL goal < 100 -I have personally reviewed and interpreted outside labs performed by patient's PCP which showed LDL 105, HDL 51 on 01/22/2024 and ALT 94 on 11/06/2023 -encouraged her to follow a mediterranean diet  Fm hx of CAD  - She denies any chest pain  - she has had some DOE recently likely related to new dx of HTN  as well as 10lb weight  gain and lack of exercise - Nuclear stress test in 2017 showed no ischemia. - Coronary calcium score 0 in 12/09/2023  HTN -BP elevated today but she says that she has white coat HTN -at home runs 125-135/70-78mmHg -continue Amlodipine  10mg  daily with PRN refills  She is very stable from a cardiac standpoint at this time I told her she can follow-up as needed   Medication Adjustments/Labs and Tests Ordered: Current medicines are reviewed at length with the patient today.  Concerns regarding medicines are outlined above.  No orders of the defined types were placed in this encounter.  No orders of the defined types were placed in this encounter.   Signed, Gaylyn Keas, MD  03/31/2024 9:26 AM    [facility]

## 2024-04-03 ENCOUNTER — Other Ambulatory Visit (HOSPITAL_COMMUNITY): Payer: Self-pay

## 2024-04-07 ENCOUNTER — Encounter (HOSPITAL_BASED_OUTPATIENT_CLINIC_OR_DEPARTMENT_OTHER): Payer: Self-pay | Admitting: Physical Therapy

## 2024-04-07 ENCOUNTER — Ambulatory Visit (HOSPITAL_BASED_OUTPATIENT_CLINIC_OR_DEPARTMENT_OTHER): Attending: Family Medicine | Admitting: Physical Therapy

## 2024-04-07 DIAGNOSIS — M25561 Pain in right knee: Secondary | ICD-10-CM | POA: Insufficient documentation

## 2024-04-07 DIAGNOSIS — M6281 Muscle weakness (generalized): Secondary | ICD-10-CM | POA: Insufficient documentation

## 2024-04-07 DIAGNOSIS — R262 Difficulty in walking, not elsewhere classified: Secondary | ICD-10-CM | POA: Diagnosis present

## 2024-04-07 NOTE — Therapy (Signed)
 OUTPATIENT PHYSICAL THERAPY LOWER EXTREMITY EVALUATION   Patient Name: Megan Combs MRN: [id number] DOB:August 05, 1961, 63 y.o., female Today's Date: 04/07/2024  END OF SESSION:  PT End of Session - 04/07/24 1534     Visit Number 3    Number of Visits 12    Date for PT Re-Evaluation 06/03/24    Authorization Type [employer]    PT Start Time 1448    PT Stop Time 1530    PT Time Calculation (min) 42 min    Activity Tolerance Patient tolerated treatment well    Behavior During Therapy WFL for tasks assessed/performed               Past Medical History:  Diagnosis Date   Allergy    Anemia    Anxiety    Arthritis    right knee   Degenerative disc disease    Depression    Family history of early CAD    Fibromyalgia    HTN (hypertension), benign    Hyperlipidemia    IBS (irritable bowel syndrome)    Lumbar radiculitis    Plantar fasciitis of left foot    PVC (premature ventricular contraction)    Salmonella poisoning    Past Surgical History:  Procedure Laterality Date   COLONOSCOPY  last 08/16/2014   EYE SURGERY Right 05/2019   Cataract   KNEE ARTHROSCOPY     KNEE ARTHROSCOPY     laproscopy     POLYPECTOMY     TONSILLECTOMY     Patient Active Problem List   Diagnosis Date Noted   HTN (hypertension), benign    Rhinitis, chronic 11/10/2017   Left ankle injury, subsequent encounter 06/21/2017   Medication intolerance 07/04/2014   Family history of early CAD 06/29/2014   Palpitations 06/29/2014   Hyperlipidemia    PVC (premature ventricular contraction)    Low back pain 06/20/2014   Right hip pain 02/15/2014   Right shoulder injury 02/01/2014   Salmonella 10/27/2013   Breast density 03/17/2013   Renal cyst, left 01/06/2013   Family history of colon cancer 01/03/2013   Elevated alkaline phosphatase measurement 01/03/2013   Articular cartilage disorder of knee 12/24/2012   Plantar fasciitis 07/21/2012   Anxiety 07/02/2012   History of anemia 07/01/2012    Depression 07/01/2012   Menopausal hot flushes 07/01/2012   DJD (degenerative joint disease) 07/01/2012   Vaginal dryness, menopausal 07/01/2012   Urge incontinence of urine 07/01/2012   Fibromyalgia    Family history of malignant neoplasm 12/25/2011   History of colonic polyps 12/25/2011    PCP: Arva Lathe, MD   REFERRING PROVIDER:  Salina Craver, MD     REFERRING DIAG:  M25.561 (ICD-10-CM) - Right knee pain, unspecified chronicity      THERAPY DIAG:  Pain in joint of right knee  Difficulty walking  Muscle weakness (generalized)  Rationale for Evaluation and Treatment: Rehabilitation  ONSET DATE: 1986 R knee debridement , patellar realignment   SUBJECTIVE:   SUBJECTIVE STATEMENT:  Pt notes the knee was sore after HEP but it has improved. She has not had pain since. She feels that walking is improving and kneeling positions for yoga are also much better. Still with occasional stiffness after sitting for a whiole.   Eval: Pt reports that over the last 9 months she has been been trying to get certified with yoga. She noticed an extra pop when she goes to extend the knee. Pt has history of PFPS with knee arthroscopy. She feels  it is about 50% better than when she first saw MD. The knee does feel stiff but takes some time to get into extension. Pt is considering steroid injection in the future. Pt did do HEP with the MD. Pt's has concern with recurrent popping. Pt denies red flags.   [facility] Exercise: Walking, swimming, yoga  PERTINENT HISTORY: HLD, HTN,  PAIN:  Are you having pain? No: NPRS scale: 5/10 with movement, 0/10 rest Pain location: inferior patellar pole Pain description: fullness  Aggravating factors: child's pose; deep knee flexion; seated hip hinge position Relieving factors: sleeve, naproxen, ice  PRECAUTIONS: None  RED FLAGS: None   WEIGHT BEARING RESTRICTIONS: No  FALLS:  Has patient fallen in last 6 months?  No  LIVING ENVIRONMENT: Lives with: lives with their family and lives with their spouse Lives in: House/apartment Stairs: 2 story home  Has following equipment at home: None  OCCUPATION: school nurse; lots of sitting   PLOF: Independent  PATIENT GOALS: return to normal exercise   OBJECTIVE:  Note: Objective measures were completed at Evaluation unless otherwise noted.  DIAGNOSTIC FINDINGS: N/A  PATIENT SURVEYS:  LEFS Lower Extremity Functional Score: 51 / 80 = 63.7 %  COGNITION: Overall cognitive status: Within functional limits for tasks assessed     SENSATION: WFL   MUSCLE LENGTH: Positive Ely's   POSTURE: No Significant postural limitations  TTP of R quad, quad tendon, Mild edema and swelling noted into polpliteal space and suprapatellar pouch; TTP of lateral patellar border    GAIT: Comments: R flexed knee gait, lack of full TKE   AROM Right 5/9 Left 5/9  Hip flexion    Hip extension    Hip abduction    Hip adduction    Hip internal rotation    Hip external rotation    Knee flexion 125 130  Knee extension -5 5  Ankle dorsiflexion    Ankle plantarflexion    Ankle inversion    Ankle eversion     (Blank rows = not tested)    MMT Right 5/9 Left 5/9  Hip flexion    Hip extension    Hip abduction    Hip adduction    Hip internal rotation    Hip external rotation    Knee flexion 29.0 29.7  Knee extension 37.8 @ 45 26.1 @ 90 34.2  Ankle dorsiflexion    Ankle plantarflexion    Ankle inversion    Ankle eversion     (Blank rows = not tested)   TODAY'S TREATMENT:                                                                                                                              DATE:   6/11 Recumbent lvl 1 5 min  Heel propped BW squat 3x6 RDL 25lbs 3x5 (heavy cuing for hip hinging and lifting technique) Bosu step lunge 3x5 Exercise progression,  5/21  Recumbent lvl 1 5 min  TKE  and knee flexion mob grade IV (flexion 135, 3  deg hyper)   Prone quad stretch 30s 3x R LE SL leg press white machine 50lbs 3x8 SL knee ext whie machine 15lbs 3x8 8 runner's step up 3x8 SL RDL 3x8  5/9  Program Notes knee flexion gapping 20x with towel behind knee  Exercises - Sitting Knee Extension with Resistance  - 1 x daily - 4-5 x weekly - 3 sets - 10 reps - 3 hold - Standing Terminal Knee Extension with Resistance  - 1 x daily - 4-5 x weekly - 3 sets - 10 reps    PATIENT EDUCATION:  Education details: anatomy, exercise progression, DOMS expectations, muscle firing,  envelope of function, HEP, POC Person educated: Patient Education method: Explanation, VC, handouts Education comprehension: verbalized understanding, requires further education   HOME EXERCISE PROGRAM:    Access Code: [email or web address] URL: [email or web address] Date: 03/05/2024 Prepared by: Silver Dross     CLINICAL IMPRESSION: No rebound effusion from previous visit or pain with exercise. Pt able to progress to hip hinge, squat depth, and SL stability exercise  today with good tolerance. Pt fatigues quickly as expected with increased ROM demands and stability demands but able to perform exercise with good tolerance. Pt requires heavy VC and TC for hip hinging motions and edu was provided on use of hip hinging during squatting to reduce anterior knee stress. Plan to continue with progressive strength as tolerated. Knee flexion to 135 and 0 ext without manual. Pt would benefit from continued skilled therapy in order to reach goals and maximize functional L LE strength and ROM for full return to normal ADL and occupation.    OBJECTIVE IMPAIRMENTS decreased activity tolerance, decreased mobility, difficulty walking, decreased ROM, decreased strength, hypomobility, increased edema, increased muscle spasms, impaired flexibility, improper body mechanics, and pain.    ACTIVITY LIMITATIONS cleaning, community activity, driving, meal prep, occupation, laundry,  yard work, shopping, school.    PERSONAL FACTORS  Age, fitness, and 1-2 comorbidities  are also affecting patient's functional outcome.     REHAB POTENTIAL: Good   CLINICAL DECISION MAKING: Stable/uncomplicated   EVALUATION COMPLEXITY: Low     GOALS:     SHORT TERM GOALS: Target date: 04/16/2024     Pt will become independent with HEP in order to demonstrate synthesis of PT education.     Goal status: ongoing   2. Pt will have an at least 9 pt improvement in LEFS measure in order to demonstrate MCID improvement in daily function.     Goal status: ongoing   3.  Pt will be able to demonstrate at least 10lb increase in HHD knee extension measure at 90 deg of flexion in order to demonstrate functional improvement in R knee extensor strength for improvement in functional mobility, gait quality, and community ambulation.    Goal status: ongoing       LONG TERM GOALS: Target date: 05/28/2024       Pt  will become independent with final HEP in order to demonstrate synthesis of PT education.     Goal status: INITIAL   2. Pt will have an at least 18 pt improvement in LEFS measure in order to demonstrate MCID improvement in daily function.    Goal status: INITIAL   3.  Pt will be able to lift/squat/hold >30 lbs in order to demonstrate functional improvement in R  LE strength for return to PLOF and occupation.    Goal status: INITIAL   4.  Pt  will be able to demonstrate/report ability to walk 30 mins on varying terrain without pain in order to demonstrate functional improvement and tolerance to exercise and community mobility.     Goal status: INITIAL     PLAN: PT FREQUENCY: 1-2x/week   PT DURATION: 12 weeks (plan to DC by 8)   PLANNED INTERVENTIONS: Therapeutic exercises, Therapeutic activity, Neuromuscular re-education, Balance training, Gait training, Patient/Family education, Joint manipulation, Joint mobilization, Stair training, Orthotic/Fit training, DME  instructions, Aquatic Therapy, Dry Needling, Electrical stimulation, Spinal manipulation, Spinal mobilization, Cryotherapy, Moist heat, Compression bandaging, scar mobilization, Taping, Vasopneumatic device, Traction, Ultrasound, Ionotophoresis 4mg /ml Dexamethasone , and Manual therapy   PLAN FOR NEXT SESSION: review HEP, progress ROM, R knee TKE, R quad strength    Silver Dross, PT 04/07/2024, 3:34 PM

## 2024-04-10 ENCOUNTER — Other Ambulatory Visit: Payer: Self-pay

## 2024-04-15 ENCOUNTER — Other Ambulatory Visit (HOSPITAL_COMMUNITY): Payer: Self-pay

## 2024-04-19 ENCOUNTER — Ambulatory Visit (HOSPITAL_BASED_OUTPATIENT_CLINIC_OR_DEPARTMENT_OTHER): Admitting: Physical Therapy

## 2024-04-19 ENCOUNTER — Encounter (HOSPITAL_BASED_OUTPATIENT_CLINIC_OR_DEPARTMENT_OTHER): Payer: Self-pay | Admitting: Physical Therapy

## 2024-04-19 DIAGNOSIS — M25561 Pain in right knee: Secondary | ICD-10-CM

## 2024-04-19 DIAGNOSIS — R262 Difficulty in walking, not elsewhere classified: Secondary | ICD-10-CM

## 2024-04-19 DIAGNOSIS — M6281 Muscle weakness (generalized): Secondary | ICD-10-CM

## 2024-04-19 NOTE — Therapy (Addendum)
 OUTPATIENT PHYSICAL THERAPY LOWER EXTREMITY EVALUATION   Patient Name: Megan Combs MRN: [id number] DOB:05-22-1961, 63 y.o., female Today's Date: 04/19/2024  END OF SESSION:  PT End of Session - 04/19/24 1335     Visit Number 4    Number of Visits 12    Date for PT Re-Evaluation 06/03/24    Authorization Type [employer]    PT Start Time 1316    PT Stop Time 1345    PT Time Calculation (min) 29 min    Activity Tolerance Patient tolerated treatment well    Behavior During Therapy WFL for tasks assessed/performed             Past Medical History:  Diagnosis Date   Allergy    Anemia    Anxiety    Arthritis    right knee   Degenerative disc disease    Depression    Family history of early CAD    Fibromyalgia    HTN (hypertension), benign    Hyperlipidemia    IBS (irritable bowel syndrome)    Lumbar radiculitis    Plantar fasciitis of left foot    PVC (premature ventricular contraction)    Salmonella poisoning    Past Surgical History:  Procedure Laterality Date   COLONOSCOPY  last 08/16/2014   EYE SURGERY Right 05/2019   Cataract   KNEE ARTHROSCOPY     KNEE ARTHROSCOPY     laproscopy     POLYPECTOMY     TONSILLECTOMY     Patient Active Problem List   Diagnosis Date Noted   HTN (hypertension), benign    Rhinitis, chronic 11/10/2017   Left ankle injury, subsequent encounter 06/21/2017   Medication intolerance 07/04/2014   Family history of early CAD 06/29/2014   Palpitations 06/29/2014   Hyperlipidemia    PVC (premature ventricular contraction)    Low back pain 06/20/2014   Right hip pain 02/15/2014   Right shoulder injury 02/01/2014   Salmonella 10/27/2013   Breast density 03/17/2013   Renal cyst, left 01/06/2013   Family history of colon cancer 01/03/2013   Elevated alkaline phosphatase measurement 01/03/2013   Articular cartilage disorder of knee 12/24/2012   Plantar fasciitis 07/21/2012   Anxiety 07/02/2012   History of anemia 07/01/2012    Depression 07/01/2012   Menopausal hot flushes 07/01/2012   DJD (degenerative joint disease) 07/01/2012   Vaginal dryness, menopausal 07/01/2012   Urge incontinence of urine 07/01/2012   Fibromyalgia    Family history of malignant neoplasm 12/25/2011   History of colonic polyps 12/25/2011    PCP: Elliot Charm, MD   REFERRING PROVIDER:  Cleatrice Ludie SAUNDERS, MD     REFERRING DIAG:  M25.561 (ICD-10-CM) - Right knee pain, unspecified chronicity      THERAPY DIAG:  Pain in joint of right knee  Difficulty walking  Muscle weakness (generalized)  Rationale for Evaluation and Treatment: Rehabilitation  ONSET DATE: 1986 R knee debridement , patellar realignment   SUBJECTIVE:   SUBJECTIVE STATEMENT:  Pt reports that the knee is better. Pt is able to get into full knee bend position with her Yoga training. It was very comfortable without pain.   Eval: Pt reports that over the last 9 months she has been been trying to get certified with yoga. She noticed an extra pop when she goes to extend the knee. Pt has history of PFPS with knee arthroscopy. She feels it is about 50% better than when she first saw MD. The knee does feel stiff but  takes some time to get into extension. Pt is considering steroid injection in the future. Pt did do HEP with the MD. Pt's has concern with recurrent popping. Pt denies red flags.   [facility] Exercise: Walking, swimming, yoga  PERTINENT HISTORY: HLD, HTN,  PAIN:  Are you having pain? No: NPRS scale: 5/10 with movement, 0/10 rest Pain location: inferior patellar pole Pain description: fullness  Aggravating factors: child's pose; deep knee flexion; seated hip hinge position Relieving factors: sleeve, naproxen, ice  PRECAUTIONS: None  RED FLAGS: None   WEIGHT BEARING RESTRICTIONS: No  FALLS:  Has patient fallen in last 6 months? No  LIVING ENVIRONMENT: Lives with: lives with their family and lives with their spouse Lives in:  House/apartment Stairs: 2 story home  Has following equipment at home: None  OCCUPATION: school nurse; lots of sitting   PLOF: Independent  PATIENT GOALS: return to normal exercise   OBJECTIVE:  Note: Objective measures were completed at Evaluation unless otherwise noted.  DIAGNOSTIC FINDINGS: N/A  PATIENT SURVEYS:  LEFS Lower Extremity Functional Score: 51 / 80 = 63.7 %  6/23: Lower Extremity Functional Score: 68 / 80    COGNITION: Overall cognitive status: Within functional limits for tasks assessed     SENSATION: WFL   MUSCLE LENGTH: Positive Ely's   POSTURE: No Significant postural limitations  TTP of R quad, quad tendon, Mild edema and swelling noted into polpliteal space and suprapatellar pouch; TTP of lateral patellar border    GAIT: Comments: R flexed knee gait, lack of full TKE   AROM Right 5/9 Left 5/9  Hip flexion    Hip extension    Hip abduction    Hip adduction    Hip internal rotation    Hip external rotation    Knee flexion 125 130  Knee extension -5 5  Ankle dorsiflexion    Ankle plantarflexion    Ankle inversion    Ankle eversion     (Blank rows = not tested)    MMT Right 5/9 R 6/23 Left 5/9  Hip flexion     Hip extension     Hip abduction     Hip adduction     Hip internal rotation     Hip external rotation     Knee flexion 29.0  29.7  Knee extension 37.8 @ 45 26.1 @ 90 45.8 @ 45 53.1 @ 90 34.2  Ankle dorsiflexion     Ankle plantarflexion     Ankle inversion     Ankle eversion      (Blank rows = not tested)   TODAY'S TREATMENT:                                                                                                                              DATE:  6/23  D/C planning, HEP, exercise progression/regressions, precautions, safety    6/11 Recumbent lvl 1 5 min  Heel propped BW squat 3x6 RDL 25lbs 3x5 (  heavy cuing for hip hinging and lifting technique) Bosu step lunge 3x5 Exercise  progression,  5/21  Recumbent lvl 1 5 min  TKE and knee flexion mob grade IV (flexion 135, 3 deg hyper)   Prone quad stretch 30s 3x R LE SL leg press white machine 50lbs 3x8 SL knee ext whie machine 15lbs 3x8 8 runner's step up 3x8 SL RDL 3x8  5/9  Program Notes knee flexion gapping 20x with towel behind knee  Exercises - Sitting Knee Extension with Resistance  - 1 x daily - 4-5 x weekly - 3 sets - 10 reps - 3 hold - Standing Terminal Knee Extension with Resistance  - 1 x daily - 4-5 x weekly - 3 sets - 10 reps    PATIENT EDUCATION:  Education details: anatomy, exercise progression, DOMS expectations, muscle firing,  envelope of function, HEP, POC Person educated: Patient Education method: Explanation, VC, handouts Education comprehension: verbalized understanding, requires further education   HOME EXERCISE PROGRAM:    Access Code: [email or web address] URL: [email or web address] Date: 03/05/2024 Prepared by: Dale Call     CLINICAL IMPRESSION: Pt has met all PT goals at this time and is able to progress with self guided exercise at this time. Pt reports 80% improvement as well demo'd by subjective and objective outcome measures. Pt d/c'd with self HEP. D/C this episode at this time.    OBJECTIVE IMPAIRMENTS decreased activity tolerance, decreased mobility, difficulty walking, decreased ROM, decreased strength, hypomobility, increased edema, increased muscle spasms, impaired flexibility, improper body mechanics, and pain.    ACTIVITY LIMITATIONS cleaning, community activity, driving, meal prep, occupation, laundry, yard work, shopping, school.    PERSONAL FACTORS  Age, fitness, and 1-2 comorbidities  are also affecting patient's functional outcome.     REHAB POTENTIAL: Good   CLINICAL DECISION MAKING: Stable/uncomplicated   EVALUATION COMPLEXITY: Low     GOALS:     SHORT TERM GOALS: Target date: 04/16/2024     Pt will become independent with HEP in order  to demonstrate synthesis of PT education.     Goal status: MET   2. Pt will have an at least 9 pt improvement in LEFS measure in order to demonstrate MCID improvement in daily function.     Goal status: MET   3.  Pt will be able to demonstrate at least 10lb increase in HHD knee extension measure at 90 deg of flexion in order to demonstrate functional improvement in R knee extensor strength for improvement in functional mobility, gait quality, and community ambulation.    Goal status: MET       LONG TERM GOALS: Target date: 05/28/2024       Pt  will become independent with final HEP in order to demonstrate synthesis of PT education.     Goal status: MTE   2. Pt will have an at least 18 pt improvement in LEFS measure in order to demonstrate MCID improvement in daily function.    Goal status: MET   3.  Pt will be able to lift/squat/hold >30 lbs in order to demonstrate functional improvement in R  LE strength for return to PLOF and occupation.    Goal status: MET   4.  Pt will be able to demonstrate/report ability to walk 30 mins on varying terrain without pain in order to demonstrate functional improvement and tolerance to exercise and community mobility.     Goal status: MET     PLAN: PT FREQUENCY: 1-2x/week   PT DURATION: 12 weeks (plan  to DC by 8)   PLANNED INTERVENTIONS: Therapeutic exercises, Therapeutic activity, Neuromuscular re-education, Balance training, Gait training, Patient/Family education, Joint manipulation, Joint mobilization, Stair training, Orthotic/Fit training, DME instructions, Aquatic Therapy, Dry Needling, Electrical stimulation, Spinal manipulation, Spinal mobilization, Cryotherapy, Moist heat, Compression bandaging, scar mobilization, Taping, Vasopneumatic device, Traction, Ultrasound, Ionotophoresis 4mg /ml Dexamethasone , and Manual therapy   PHYSICAL THERAPY DISCHARGE SUMMARY  Visits from Start of Care: 4   Plan: Patient agrees to discharge.   Patient goals were met. Patient is being discharged due to meeting the stated rehab goals.          Dale Call, PT 04/19/2024, 2:03 PM

## 2024-05-03 ENCOUNTER — Ambulatory Visit: Admitting: Cardiology

## 2024-05-04 ENCOUNTER — Other Ambulatory Visit (HOSPITAL_COMMUNITY): Payer: Self-pay

## 2024-05-11 ENCOUNTER — Other Ambulatory Visit (HOSPITAL_COMMUNITY): Payer: Self-pay

## 2024-05-11 ENCOUNTER — Other Ambulatory Visit: Payer: Self-pay

## 2024-05-12 ENCOUNTER — Other Ambulatory Visit (HOSPITAL_COMMUNITY): Payer: Self-pay

## 2024-05-14 ENCOUNTER — Other Ambulatory Visit (HOSPITAL_COMMUNITY): Payer: Self-pay

## 2024-05-15 ENCOUNTER — Other Ambulatory Visit (HOSPITAL_COMMUNITY): Payer: Self-pay

## 2024-06-01 ENCOUNTER — Other Ambulatory Visit (HOSPITAL_COMMUNITY): Payer: Self-pay

## 2024-06-01 MED ORDER — AMLODIPINE BESYLATE 10 MG PO TABS
10.0000 mg | ORAL_TABLET | Freq: Every day | ORAL | 5 refills | Status: AC
  Filled 2024-06-01: qty 90, 90d supply, fill #0

## 2024-06-01 MED ORDER — ESTRADIOL 10 MCG VA TABS
10.0000 ug | ORAL_TABLET | VAGINAL | 11 refills | Status: AC
  Filled 2024-06-01: qty 8, 28d supply, fill #0

## 2024-06-04 ENCOUNTER — Ambulatory Visit: Admitting: Cardiology

## 2024-06-14 ENCOUNTER — Other Ambulatory Visit: Payer: Self-pay

## 2024-06-14 ENCOUNTER — Other Ambulatory Visit (HOSPITAL_COMMUNITY): Payer: Self-pay

## 2024-06-14 MED ORDER — FETZIMA 20 MG PO CP24
20.0000 mg | ORAL_CAPSULE | Freq: Every morning | ORAL | 5 refills | Status: AC
  Filled 2024-06-14 – 2024-08-25 (×2): qty 30, 30d supply, fill #0
  Filled 2024-09-27: qty 30, 30d supply, fill #1
  Filled 2024-10-19: qty 30, 30d supply, fill #2
  Filled 2024-11-18 – 2024-11-25 (×2): qty 30, 30d supply, fill #3

## 2024-06-14 MED ORDER — BUPROPION HCL ER (XL) 150 MG PO TB24
450.0000 mg | ORAL_TABLET | Freq: Every morning | ORAL | 1 refills | Status: AC
  Filled 2024-06-14: qty 90, 30d supply, fill #0
  Filled 2024-07-18: qty 90, 30d supply, fill #1
  Filled 2024-08-14: qty 90, 30d supply, fill #2
  Filled 2024-09-18: qty 90, 30d supply, fill #3
  Filled 2024-11-01: qty 90, 30d supply, fill #4

## 2024-06-15 ENCOUNTER — Other Ambulatory Visit (HOSPITAL_COMMUNITY): Payer: Self-pay

## 2024-06-25 ENCOUNTER — Other Ambulatory Visit (HOSPITAL_COMMUNITY): Payer: Self-pay

## 2024-07-08 ENCOUNTER — Other Ambulatory Visit (HOSPITAL_COMMUNITY): Payer: Self-pay

## 2024-07-13 ENCOUNTER — Other Ambulatory Visit (HOSPITAL_COMMUNITY): Payer: Self-pay

## 2024-07-13 ENCOUNTER — Other Ambulatory Visit: Payer: Self-pay

## 2024-07-18 ENCOUNTER — Other Ambulatory Visit (HOSPITAL_COMMUNITY): Payer: Self-pay

## 2024-07-19 ENCOUNTER — Other Ambulatory Visit (HOSPITAL_COMMUNITY): Payer: Self-pay

## 2024-07-19 ENCOUNTER — Other Ambulatory Visit: Payer: Self-pay

## 2024-07-19 MED ORDER — METOPROLOL SUCCINATE ER 25 MG PO TB24
12.5000 mg | ORAL_TABLET | Freq: Every day | ORAL | 0 refills | Status: AC
  Filled 2024-07-19: qty 15, 30d supply, fill #0
  Filled 2024-08-13: qty 15, 30d supply, fill #1

## 2024-07-28 ENCOUNTER — Other Ambulatory Visit (HOSPITAL_COMMUNITY): Payer: Self-pay

## 2024-07-29 ENCOUNTER — Other Ambulatory Visit: Payer: Self-pay

## 2024-07-29 ENCOUNTER — Other Ambulatory Visit (HOSPITAL_COMMUNITY): Payer: Self-pay

## 2024-07-29 MED ORDER — FLUTICASONE PROPIONATE 50 MCG/ACT NA SUSP
2.0000 | Freq: Every day | NASAL | 11 refills | Status: AC
  Filled 2024-07-29: qty 16, 30d supply, fill #0
  Filled 2024-11-18 – 2024-11-20 (×2): qty 16, 30d supply, fill #1

## 2024-08-03 ENCOUNTER — Other Ambulatory Visit (HOSPITAL_COMMUNITY): Payer: Self-pay

## 2024-08-11 ENCOUNTER — Other Ambulatory Visit (HOSPITAL_COMMUNITY): Payer: Self-pay

## 2024-08-11 MED ORDER — PROGESTERONE MICRONIZED 100 MG PO CAPS
100.0000 mg | ORAL_CAPSULE | ORAL | 1 refills | Status: AC
  Filled 2024-08-11 – 2024-08-20 (×2): qty 15, 30d supply, fill #0
  Filled 2024-09-18: qty 15, 30d supply, fill #1
  Filled 2024-10-19: qty 15, 30d supply, fill #2
  Filled 2024-11-21: qty 15, 30d supply, fill #3

## 2024-08-11 MED ORDER — METOPROLOL SUCCINATE ER 25 MG PO TB24
12.5000 mg | ORAL_TABLET | Freq: Every day | ORAL | 5 refills | Status: AC
  Filled 2024-08-11: qty 15, 30d supply, fill #0
  Filled 2024-09-12: qty 15, 30d supply, fill #1
  Filled 2024-10-11: qty 15, 30d supply, fill #2
  Filled 2024-11-08: qty 15, 30d supply, fill #3

## 2024-08-11 MED ORDER — NYSTATIN 100000 UNIT/GM EX CREA
1.0000 | TOPICAL_CREAM | Freq: Two times a day (BID) | CUTANEOUS | 1 refills | Status: AC | PRN
  Filled 2024-08-11: qty 15, 8d supply, fill #0

## 2024-08-11 MED ORDER — ESTRADIOL 10 MCG VA TABS
1.0000 | ORAL_TABLET | VAGINAL | 5 refills | Status: AC
  Filled 2024-08-11: qty 8, 28d supply, fill #0

## 2024-08-11 MED ORDER — ESTRADIOL 0.5 MG PO TABS
0.2500 mg | ORAL_TABLET | ORAL | 1 refills | Status: AC
  Filled 2024-08-11: qty 15, 30d supply, fill #0
  Filled 2024-08-13: qty 45, 90d supply, fill #0
  Filled 2024-11-18 – 2024-11-20 (×2): qty 15, 30d supply, fill #0

## 2024-08-13 ENCOUNTER — Other Ambulatory Visit (HOSPITAL_COMMUNITY): Payer: Self-pay

## 2024-08-15 ENCOUNTER — Other Ambulatory Visit (HOSPITAL_COMMUNITY): Payer: Self-pay

## 2024-08-18 ENCOUNTER — Other Ambulatory Visit (HOSPITAL_COMMUNITY): Payer: Self-pay

## 2024-08-20 ENCOUNTER — Other Ambulatory Visit (HOSPITAL_COMMUNITY): Payer: Self-pay

## 2024-08-21 ENCOUNTER — Other Ambulatory Visit (HOSPITAL_COMMUNITY): Payer: Self-pay

## 2024-08-23 ENCOUNTER — Ambulatory Visit: Admitting: Family Medicine

## 2024-08-23 ENCOUNTER — Other Ambulatory Visit: Payer: Self-pay

## 2024-08-23 ENCOUNTER — Other Ambulatory Visit (HOSPITAL_COMMUNITY): Payer: Self-pay

## 2024-08-23 VITALS — BP 146/84 | Ht 66.0 in | Wt 160.0 lb

## 2024-08-23 DIAGNOSIS — M25532 Pain in left wrist: Secondary | ICD-10-CM

## 2024-08-23 MED ORDER — IBUPROFEN 800 MG PO TABS
800.0000 mg | ORAL_TABLET | Freq: Three times a day (TID) | ORAL | 1 refills | Status: AC | PRN
  Filled 2024-08-23: qty 60, 20d supply, fill #0

## 2024-08-23 NOTE — Patient Instructions (Addendum)
 You have a wrist contusion. Because of the concern for a hairline/occult scaphoid fracture we need to treat this conservatively. Wear the thumb spica brace the next 2 weeks - ok to take off to ice the area and shower/bathe. Hydrocodone  as needed for severe pain. Icing 15 minutes at a time 3-4 times a day. Follow up with me in 2 weeks for reevaluation.  If still having pain will repeat x-rays.

## 2024-08-24 ENCOUNTER — Encounter: Payer: Self-pay | Admitting: Family Medicine

## 2024-08-24 NOTE — Progress Notes (Signed)
 PCP: Elliot Charm, MD  Patient is a 63 y.o. female here for left wrist injury.  HPI Patient reports on 10/26 she accidentally fell backwards when lifting a rolling bag and sustained a FOOSH injury to her left wrist. Difficulty moving initially. Went to urgent care and had negative radiographs. She is right handed. Struck right shoulder on door frame and fell onto her sacrum but wrist is primary issue currently. Wearing wrist brace, taking rare pain medication prescribed by urgent care.  Past Medical History:  Diagnosis Date   Allergy    Anemia    Anxiety    Arthritis    right knee   Degenerative disc disease    Depression    Family history of early CAD    Fibromyalgia    HTN (hypertension), benign    Hyperlipidemia    IBS (irritable bowel syndrome)    Lumbar radiculitis    Plantar fasciitis of left foot    PVC (premature ventricular contraction)    Salmonella poisoning     Current Outpatient Medications on File Prior to Visit  Medication Sig Dispense Refill   acetic acid -hydrocortisone  (VOSOL -HC) OTIC solution Place 4 drops into both ears nightly for 2 weeks, then use as needed for itching. 10 mL 2   albuterol  (VENTOLIN  HFA) 108 (90 Base) MCG/ACT inhaler Use 2 puffs as needed for wheezing every 4 - 6 hours 6.7 g 3   amLODipine  (NORVASC ) 10 MG tablet Take 1 tablet (10 mg total) by mouth daily. 90 tablet 1   amLODipine  (NORVASC ) 10 MG tablet Take 1 tablet (10 mg total) by mouth daily. 90 tablet 5   Azelastine -Fluticasone  137-50 MCG/ACT SUSP Place 1 spray into the nose 2 (two) times daily. 23 g 0   B Complex-C-E-Zn (B COMPLEX-C-E-ZINC) tablet Take by mouth.     buPROPion  (WELLBUTRIN  XL) 150 MG 24 hr tablet Take 3 tablets by mouth each morning 270 tablet 1   buPROPion  (WELLBUTRIN  XL) 150 MG 24 hr tablet Take 3 tablets (450 mg total) by mouth in the morning. 270 tablet 1   buPROPion  (WELLBUTRIN  XL) 300 MG 24 hr tablet Take 1 tablet (300 mg total) by mouth in the  morning. (Patient not taking: Reported on 03/31/2024) 90 tablet 1   Calcium Carbonate-Vitamin D  (CALCIUM 500 + D) 500-125 MG-UNIT TABS Take 1 tablet by mouth daily.     estradiol  (ESTRACE ) 0.5 MG tablet Take 0.5 tablets (0.25 mg total) by mouth daily. 90 tablet 4   estradiol  (ESTRACE ) 0.5 MG tablet Take 0.5 tablets (0.25 mg total) by mouth every other day. 45 tablet 1   estradiol  (ESTRACE ) 1 MG tablet TAKE 1 TABLET (1 MG TOTAL) BY MOUTH DAILY. (Patient not taking: Reported on 03/31/2024) 90 tablet 0   Estradiol  (VAGIFEM ) 10 MCG TABS vaginal tablet Place 1 tablet (10 mcg total) vaginally 2 (two) times a week. 8 tablet 11   Estradiol  (VAGIFEM ) 10 MCG TABS vaginal tablet Place 1 tablet (10 mcg total) vaginally 2 (two) times a week. 24 tablet 5   fluticasone  (FLONASE ) 50 MCG/ACT nasal spray Place 2 sprays into both nostrils every evening. 16 g 5   fluticasone  (FLONASE ) 50 MCG/ACT nasal spray Place 2 sprays into both nostrils daily. 16 g 11   Glucosamine Sulfate 500 MG CAPS Take by mouth.     Levomilnacipran  HCl ER (FETZIMA ) 20 MG CP24 Take 1 capsule (20 mg total) by mouth every morning. 30 capsule 4   Levomilnacipran  HCl ER (FETZIMA ) 20 MG CP24 Take 1  capsule (20 mg total) by mouth in the morning. 30 capsule 5   Levomilnacipran  HCl ER (FETZIMA ) 40 MG CP24 Take 1 capsule by mouth every morning. (Patient not taking: Reported on 03/31/2024) 90 capsule 0   metoprolol  succinate (TOPROL -XL) 25 MG 24 hr tablet Take 0.5 tablets (12.5 mg total) by mouth at bedtime. 30 tablet 0   metoprolol  succinate (TOPROL -XL) 25 MG 24 hr tablet Take 0.5 tablets (12.5 mg total) by mouth at bedtime. 45 tablet 5   Multiple Vitamin (MULITIVITAMIN WITH MINERALS) TABS Take 1 tablet by mouth daily.     nystatin  cream (MYCOSTATIN ) Apply 1 Application topically 2 (two) times daily as needed. 15 g 1   nystatin  ointment (MYCOSTATIN ) Apply to affected area 2 times a day for 7 days (Patient taking differently: Apply topically as needed.) 15 g 0    progesterone  (PROMETRIUM ) 100 MG capsule Take 1 capsule (100 mg total) by mouth at bedtime every other night 45 capsule 5   progesterone  (PROMETRIUM ) 100 MG capsule Take 1 capsule (100 mg total) by mouth every other day at bedtime. 45 capsule 1   triamcinolone  cream (KENALOG ) 0.5 % Apply 1 application twice a day for 7 days (Patient taking differently: Apply topically as needed.) 15 g 0   No current facility-administered medications on file prior to visit.    Past Surgical History:  Procedure Laterality Date   COLONOSCOPY  last 08/16/2014   EYE SURGERY Right 05/2019   Cataract   KNEE ARTHROSCOPY     KNEE ARTHROSCOPY     laproscopy     POLYPECTOMY     TONSILLECTOMY      Allergies  Allergen Reactions   Morphine And Codeine     Other reaction(s): Redness IV   Other Rash    Medical tape, band aids   Penicillins Hives and Rash    Did it involve swelling of the face/tongue/throat, SOB, or low BP? No Did it involve sudden or severe rash/hives, skin peeling, or any reaction on the inside of your mouth or nose? No Did you need to seek medical attention at a hospital or doctor's office? No When did it last happen?      4 years ago If all above answers are "NO", may proceed with cephalosporin use.   Zithromax  [Azithromycin ] Rash and Hives   Effexor  [Venlafaxine ]     Intolerant SSRI/SNRI   Erythromycin Nausea And Vomiting   Prednisone  Other (See Comments)    Paranoid , anxious, hyper emotionally, could not sleep, odd dreams Causes severe anxiety and insomnia.   Tape Dermatitis    BP (!) 146/84   Ht 5' 6 (1.676 m)   Wt 160 lb (72.6 kg)   LMP 10/28/2008   BMI 25.82 kg/m      02/06/2022    3:23 PM  [facility]  Frequency of aerobic exercise (# of days/week) 2  Average time in minutes 15  Frequency of strengthening activities (# of days/week) 1        No data to display              Objective:  Physical Exam:  Gen: NAD, comfortable in  exam room  Left wrist: No deformity.  Mild swelling dorsal wrist.  No bruising. Able to flex and extend digits.  Wrist motion limited due to pain.   Tenderness to palpation over scaphoid.  No tenderness distal radius, ulna, 1st digit, elsewhere about hand/wrist. NVI distally.  Limited MSK u/s left wrist:  No cortical irregularity visualized of scaphoid, distal radius.  Assessment and Plan:  Left wrist injury - initial radiographs negative.  Concern for possible occult scaphoid fracture though likely has a wrist contusion.  Will switch to thumb spica brace.  Icing, hydrocodone , ibuprofen  if needed.  Follow up in 2 weeks.

## 2024-08-25 ENCOUNTER — Other Ambulatory Visit: Payer: Self-pay

## 2024-08-25 ENCOUNTER — Other Ambulatory Visit (HOSPITAL_COMMUNITY): Payer: Self-pay

## 2024-08-26 ENCOUNTER — Other Ambulatory Visit (HOSPITAL_COMMUNITY): Payer: Self-pay

## 2024-09-05 ENCOUNTER — Other Ambulatory Visit (HOSPITAL_COMMUNITY): Payer: Self-pay

## 2024-09-05 MED ORDER — LIDOCAINE VISCOUS HCL 2 % MT SOLN
5.0000 mL | OROMUCOSAL | 0 refills | Status: AC | PRN
Start: 2024-09-05 — End: ?
  Filled 2024-09-05: qty 100, 30d supply, fill #0

## 2024-09-06 ENCOUNTER — Ambulatory Visit: Admitting: Family Medicine

## 2024-09-07 ENCOUNTER — Ambulatory Visit (INDEPENDENT_AMBULATORY_CARE_PROVIDER_SITE_OTHER): Admitting: Family Medicine

## 2024-09-07 ENCOUNTER — Encounter: Payer: Self-pay | Admitting: Family Medicine

## 2024-09-07 VITALS — BP 118/74 | Ht 66.0 in | Wt 160.0 lb

## 2024-09-07 DIAGNOSIS — S60212D Contusion of left wrist, subsequent encounter: Secondary | ICD-10-CM

## 2024-09-07 NOTE — Progress Notes (Signed)
 DATE OF VISIT: 09/07/2024        Megan Combs DOB: [birth year] MRN: [id number]  Discussed the use of AI scribe software for clinical note transcription with the patient, who gave verbal consent to proceed.  History of Present Illness Megan Combs is a 63 year old female who presents for follow-up of left wrist and thumb pain after a fall.  Last seen by Dr. Cleatrice 08/23/24  - placed in thumb spica at that time for concern of contusion vs scaphoid injury  Left wrist and thumb pain following trauma - Sustained a FOOSH (fall on outstretched hand) injury on August 22, 2024 - Initial symptoms included swelling and pain in the left wrist and thumb - Placed in a thumb spica brace for approximately one week after injury - Currently experiences mild stiffness, particularly on cold days - Swelling has decreased, only slight puffiness now around the thenar eminence - Soreness localized to the thenar eminence, especially with lifting objects such as a tray - Weakness in the left thumb when lifting objects  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   albuterol  (VENTOLIN  HFA) 108 (90 Base) MCG/ACT inhaler Use 2 puffs as needed for wheezing every 4 - 6 hours   amLODipine  (NORVASC ) 10 MG tablet Take 1 tablet (10 mg total) by mouth daily.   amLODipine  (NORVASC ) 10 MG tablet Take 1 tablet (10 mg total) by mouth daily.   Azelastine -Fluticasone  137-50 MCG/ACT SUSP Place 1 spray into the nose 2 (two) times daily.   B Complex-C-E-Zn (B COMPLEX-C-E-ZINC) tablet Take by mouth.   buPROPion   (WELLBUTRIN  XL) 150 MG 24 hr tablet Take 3 tablets by mouth each morning   buPROPion  (WELLBUTRIN  XL) 150 MG 24 hr tablet Take 3 tablets (450 mg total) by mouth in the morning.   buPROPion  (WELLBUTRIN  XL) 300 MG 24 hr tablet Take 1 tablet (300 mg total) by mouth in the morning. (Patient not taking: Reported on 03/31/2024)   Calcium Carbonate-Vitamin D  (CALCIUM 500 + D) 500-125 MG-UNIT TABS Take 1 tablet by mouth daily.   estradiol  (ESTRACE ) 0.5 MG tablet Take 0.5 tablets (0.25 mg total) by mouth daily.   estradiol  (ESTRACE ) 0.5 MG tablet Take 0.5 tablets (0.25 mg total) by mouth every other day.   estradiol  (ESTRACE ) 1 MG tablet TAKE 1 TABLET (1 MG TOTAL) BY MOUTH DAILY. (Patient not taking: Reported on 03/31/2024)   Estradiol  (VAGIFEM ) 10 MCG TABS vaginal tablet Place 1 tablet (10 mcg total) vaginally 2 (two) times a week.   Estradiol  (VAGIFEM ) 10 MCG TABS vaginal tablet Place 1 tablet (10 mcg total) vaginally 2 (two) times a week.   fluticasone  (FLONASE ) 50 MCG/ACT nasal spray Place 2 sprays into both nostrils every evening.   fluticasone  (FLONASE ) 50 MCG/ACT nasal spray Place 2 sprays into both nostrils daily.   Glucosamine Sulfate 500 MG CAPS Take by mouth.   ibuprofen  (ADVIL ) 800 MG tablet Take 1 tablet (800 mg total) by mouth every 8 (eight) hours as needed.   Levomilnacipran  HCl ER (FETZIMA ) 20 MG CP24 Take 1 capsule (20 mg total) by mouth every morning.   Levomilnacipran  HCl  ER (FETZIMA ) 20 MG CP24 Take 1 capsule (20 mg total) by mouth in the morning.   Levomilnacipran  HCl ER (FETZIMA ) 40 MG CP24 Take 1 capsule by mouth every morning. (Patient not taking: Reported on 03/31/2024)   lidocaine  (XYLOCAINE ) 2 % solution Use as directed 5 mLs in the mouth or throat as needed for pain.   metoprolol  succinate (TOPROL -XL) 25 MG 24 hr tablet Take 0.5 tablets (12.5 mg total) by mouth at bedtime.   metoprolol  succinate (TOPROL -XL) 25 MG 24 hr tablet Take 0.5 tablets (12.5 mg total) by mouth at bedtime.    Multiple Vitamin (MULITIVITAMIN WITH MINERALS) TABS Take 1 tablet by mouth daily.   nystatin  cream (MYCOSTATIN ) Apply 1 Application topically 2 (two) times daily as needed.   nystatin  ointment (MYCOSTATIN ) Apply to affected area 2 times a day for 7 days (Patient taking differently: Apply topically as needed.)   progesterone  (PROMETRIUM ) 100 MG capsule Take 1 capsule (100 mg total) by mouth at bedtime every other night   progesterone  (PROMETRIUM ) 100 MG capsule Take 1 capsule (100 mg total) by mouth every other day at bedtime.   triamcinolone  cream (KENALOG ) 0.5 % Apply 1 application twice a day for 7 days (Patient taking differently: Apply topically as needed.)   No facility-administered encounter medications on file as of 09/07/2024.    Allergies: is allergic to morphine and codeine, other, penicillins, zithromax  [azithromycin ], effexor  [venlafaxine ], erythromycin, prednisone , and tape.  Physical Examination: Vitals: BP 118/74   Ht 5' 6 (1.676 m)   Wt 160 lb (72.6 kg)   LMP 10/28/2008   BMI 25.82 kg/m  GENERAL:  Megan Combs is a 63 y.o. female appearing their stated age, alert and oriented x 3, in no apparent distress.  SKIN: no rashes or lesions, skin clean, dry, intact MSK: Hand/wrist: Left hand and wrist without any gross deformity.  No swelling or bruising.  Full range of motion of the wrist and thumb without pain.  Does have some mild crepitus at the MCP joints of the thumbs, but no pain.  No tenderness over the scaphoid/anatomical snuffbox.  Normal grip strength.  No other bony tenderness.  Slight tenderness over soft tissues of the thenar eminence. Neurovascularly intact distally  Radiology: Limited left wrist ultrasound 08/23/2024 by Dr. Cleatrice showing: Limited MSK u/s left wrist: No cortical irregularity visualized of scaphoid, distal radius.   Assessment & Plan Left wrist and thumb contusion status post FOOSH injury 08/22/2024-greatly improved - Assuring exam  today, no tenderness over the scaphoid, history and exam most consistent with resolving contusion from her FOOSH injury - Provided exercises for thumb stiffness  - Advised Voltaren  gel every 6-8 hours as needed for pain relief - Okay to advance yoga activities as possible, may need to use additional cushioning under her hand with upcoming classes this weekend -Follow-up as needed, if has worsening pain she will reach out and we will consider an x-ray  Patient expressed understanding & agreement with above.  Encounter Diagnosis  Name Primary?   Contusion of left wrist, subsequent encounter Yes    No orders of the defined types were placed in this encounter.    Contains text generated by [employer].

## 2024-09-12 ENCOUNTER — Encounter (HOSPITAL_COMMUNITY): Payer: Self-pay

## 2024-09-17 ENCOUNTER — Other Ambulatory Visit (HOSPITAL_COMMUNITY): Payer: Self-pay

## 2024-09-17 MED ORDER — MOMETASONE FUROATE 0.1 % EX CREA
1.0000 | TOPICAL_CREAM | Freq: Every day | CUTANEOUS | 3 refills | Status: AC
Start: 2024-09-17 — End: ?
  Filled 2024-09-17: qty 15, 30d supply, fill #0

## 2024-09-18 ENCOUNTER — Other Ambulatory Visit (HOSPITAL_COMMUNITY): Payer: Self-pay

## 2024-09-21 ENCOUNTER — Other Ambulatory Visit (HOSPITAL_COMMUNITY): Payer: Self-pay

## 2024-09-25 ENCOUNTER — Other Ambulatory Visit (HOSPITAL_COMMUNITY): Payer: Self-pay

## 2024-09-27 ENCOUNTER — Other Ambulatory Visit: Payer: Self-pay

## 2024-09-27 ENCOUNTER — Other Ambulatory Visit (HOSPITAL_COMMUNITY): Payer: Self-pay

## 2024-09-28 ENCOUNTER — Other Ambulatory Visit (HOSPITAL_COMMUNITY): Payer: Self-pay

## 2024-10-19 ENCOUNTER — Other Ambulatory Visit: Payer: Self-pay

## 2024-10-19 ENCOUNTER — Other Ambulatory Visit (HOSPITAL_COMMUNITY): Payer: Self-pay

## 2024-10-20 ENCOUNTER — Other Ambulatory Visit: Payer: Self-pay

## 2024-11-02 ENCOUNTER — Other Ambulatory Visit (HOSPITAL_COMMUNITY): Payer: Self-pay

## 2024-11-15 ENCOUNTER — Other Ambulatory Visit: Payer: Self-pay

## 2024-11-18 ENCOUNTER — Other Ambulatory Visit (HOSPITAL_COMMUNITY): Payer: Self-pay

## 2024-11-19 ENCOUNTER — Other Ambulatory Visit: Payer: Self-pay

## 2024-11-20 ENCOUNTER — Other Ambulatory Visit (HOSPITAL_COMMUNITY): Payer: Self-pay

## 2024-11-20 MED ORDER — CEFDINIR 300 MG PO CAPS
300.0000 mg | ORAL_CAPSULE | Freq: Two times a day (BID) | ORAL | 0 refills | Status: AC
  Filled 2024-11-20: qty 20, 10d supply, fill #0

## 2024-11-25 ENCOUNTER — Other Ambulatory Visit (HOSPITAL_COMMUNITY): Payer: Self-pay

## 2024-11-30 ENCOUNTER — Other Ambulatory Visit (HOSPITAL_COMMUNITY): Payer: Self-pay
